# Patient Record
Sex: Female | Born: 1945 | Race: White | Hispanic: No | Marital: Single | Smoking: Former smoker
Health system: Southern US, Community
[De-identification: ages and names within clinical notes are randomized; demographics above are authoritative.]

## PROBLEM LIST (undated history)

## (undated) DIAGNOSIS — D61818 Other pancytopenia: Principal | ICD-10-CM

## (undated) DIAGNOSIS — K760 Fatty (change of) liver, not elsewhere classified: Secondary | ICD-10-CM

## (undated) DIAGNOSIS — K219 Gastro-esophageal reflux disease without esophagitis: Secondary | ICD-10-CM

## (undated) DIAGNOSIS — F32A Depression, unspecified: Secondary | ICD-10-CM

## (undated) DIAGNOSIS — M810 Age-related osteoporosis without current pathological fracture: Secondary | ICD-10-CM

## (undated) DIAGNOSIS — E042 Nontoxic multinodular goiter: Secondary | ICD-10-CM

## (undated) DIAGNOSIS — F419 Anxiety disorder, unspecified: Secondary | ICD-10-CM

## (undated) DIAGNOSIS — F329 Major depressive disorder, single episode, unspecified: Secondary | ICD-10-CM

## (undated) DIAGNOSIS — J45909 Unspecified asthma, uncomplicated: Secondary | ICD-10-CM

## (undated) DIAGNOSIS — M170 Bilateral primary osteoarthritis of knee: Secondary | ICD-10-CM

## (undated) DIAGNOSIS — I10 Essential (primary) hypertension: Secondary | ICD-10-CM

## (undated) HISTORY — DX: Age-related osteoporosis without current pathological fracture: M81.0

## (undated) HISTORY — DX: Other pancytopenia: D61.818

## (undated) HISTORY — DX: Bilateral primary osteoarthritis of knee: M17.0

## (undated) HISTORY — PX: KNEE SURGERY: SHX244

---

## 1998-04-14 ENCOUNTER — Inpatient Hospital Stay (HOSPITAL_COMMUNITY): Admission: EM | Admit: 1998-04-14 | Discharge: 1998-04-17 | Payer: Self-pay

## 2004-09-29 ENCOUNTER — Ambulatory Visit: Payer: Self-pay | Admitting: Internal Medicine

## 2004-10-19 ENCOUNTER — Ambulatory Visit: Payer: Self-pay | Admitting: Internal Medicine

## 2005-03-24 ENCOUNTER — Ambulatory Visit: Payer: Self-pay | Admitting: Internal Medicine

## 2005-07-31 ENCOUNTER — Ambulatory Visit: Payer: Self-pay | Admitting: Internal Medicine

## 2008-04-12 ENCOUNTER — Encounter: Admission: RE | Admit: 2008-04-12 | Discharge: 2008-04-12 | Payer: Self-pay | Admitting: Orthopaedic Surgery

## 2008-05-17 ENCOUNTER — Encounter: Admission: RE | Admit: 2008-05-17 | Discharge: 2008-07-04 | Payer: Self-pay | Admitting: Orthopaedic Surgery

## 2013-06-13 ENCOUNTER — Inpatient Hospital Stay (HOSPITAL_COMMUNITY): Admission: RE | Admit: 2013-06-13 | Payer: Self-pay | Source: Ambulatory Visit

## 2013-11-02 ENCOUNTER — Other Ambulatory Visit (HOSPITAL_COMMUNITY)
Admission: RE | Admit: 2013-11-02 | Discharge: 2013-11-02 | Disposition: A | Discharge status: Home / Self Care | Payer: Medicare Other | Source: Ambulatory Visit | Attending: Family Medicine | Admitting: Family Medicine

## 2013-11-02 ENCOUNTER — Other Ambulatory Visit: Payer: Self-pay | Admitting: Family Medicine

## 2013-11-02 DIAGNOSIS — Z1151 Encounter for screening for human papillomavirus (HPV): Secondary | ICD-10-CM | POA: Insufficient documentation

## 2013-11-02 DIAGNOSIS — Z124 Encounter for screening for malignant neoplasm of cervix: Secondary | ICD-10-CM | POA: Insufficient documentation

## 2014-02-27 ENCOUNTER — Other Ambulatory Visit: Payer: Self-pay

## 2014-02-27 DIAGNOSIS — Z1231 Encounter for screening mammogram for malignant neoplasm of breast: Secondary | ICD-10-CM

## 2014-03-13 ENCOUNTER — Ambulatory Visit
Admission: RE | Admit: 2014-03-13 | Discharge: 2014-03-13 | Disposition: A | Discharge status: Home / Self Care | Payer: Medicare Other | Source: Ambulatory Visit

## 2014-03-13 DIAGNOSIS — Z1231 Encounter for screening mammogram for malignant neoplasm of breast: Secondary | ICD-10-CM

## 2014-11-04 ENCOUNTER — Other Ambulatory Visit: Payer: Self-pay | Admitting: Family Medicine

## 2014-11-04 DIAGNOSIS — E042 Nontoxic multinodular goiter: Secondary | ICD-10-CM

## 2014-11-08 ENCOUNTER — Ambulatory Visit
Admission: RE | Admit: 2014-11-08 | Discharge: 2014-11-08 | Disposition: A | Discharge status: Home / Self Care | Payer: Medicare Other | Source: Ambulatory Visit | Attending: Family Medicine | Admitting: Family Medicine

## 2014-11-08 DIAGNOSIS — E042 Nontoxic multinodular goiter: Secondary | ICD-10-CM

## 2015-03-25 ENCOUNTER — Other Ambulatory Visit: Payer: Self-pay

## 2015-03-25 DIAGNOSIS — Z1231 Encounter for screening mammogram for malignant neoplasm of breast: Secondary | ICD-10-CM

## 2015-05-01 ENCOUNTER — Ambulatory Visit
Admission: RE | Admit: 2015-05-01 | Discharge: 2015-05-01 | Disposition: A | Discharge status: Home / Self Care | Payer: Medicare Other | Source: Ambulatory Visit

## 2015-05-01 DIAGNOSIS — Z1231 Encounter for screening mammogram for malignant neoplasm of breast: Secondary | ICD-10-CM

## 2016-07-15 ENCOUNTER — Other Ambulatory Visit: Payer: Self-pay | Admitting: Family Medicine

## 2016-07-15 DIAGNOSIS — Z1231 Encounter for screening mammogram for malignant neoplasm of breast: Secondary | ICD-10-CM

## 2016-07-16 ENCOUNTER — Ambulatory Visit
Admission: RE | Admit: 2016-07-16 | Discharge: 2016-07-16 | Disposition: A | Discharge status: Home / Self Care | Payer: Medicare Other | Source: Ambulatory Visit | Attending: Family Medicine | Admitting: Family Medicine

## 2016-07-16 DIAGNOSIS — Z1231 Encounter for screening mammogram for malignant neoplasm of breast: Secondary | ICD-10-CM

## 2017-03-11 ENCOUNTER — Encounter (HOSPITAL_COMMUNITY): Payer: Self-pay | Admitting: Emergency Medicine

## 2017-03-11 ENCOUNTER — Emergency Department (HOSPITAL_COMMUNITY)
Admission: EM | Admit: 2017-03-11 | Discharge: 2017-03-11 | Disposition: A | Discharge status: Home / Self Care | Payer: Medicare Other | Attending: Emergency Medicine | Admitting: Emergency Medicine

## 2017-03-11 ENCOUNTER — Emergency Department (HOSPITAL_COMMUNITY): Payer: Medicare Other

## 2017-03-11 DIAGNOSIS — E860 Dehydration: Secondary | ICD-10-CM | POA: Diagnosis not present

## 2017-03-11 DIAGNOSIS — R51 Headache: Secondary | ICD-10-CM | POA: Diagnosis not present

## 2017-03-11 DIAGNOSIS — Z87891 Personal history of nicotine dependence: Secondary | ICD-10-CM | POA: Diagnosis not present

## 2017-03-11 DIAGNOSIS — M791 Myalgia: Secondary | ICD-10-CM | POA: Insufficient documentation

## 2017-03-11 DIAGNOSIS — I959 Hypotension, unspecified: Secondary | ICD-10-CM | POA: Insufficient documentation

## 2017-03-11 DIAGNOSIS — R42 Dizziness and giddiness: Secondary | ICD-10-CM | POA: Insufficient documentation

## 2017-03-11 DIAGNOSIS — R0789 Other chest pain: Secondary | ICD-10-CM | POA: Insufficient documentation

## 2017-03-11 LAB — URINALYSIS, ROUTINE W REFLEX MICROSCOPIC
Bilirubin Urine: NEGATIVE
GLUCOSE, UA: NEGATIVE mg/dL
Ketones, ur: NEGATIVE mg/dL
LEUKOCYTES UA: NEGATIVE
NITRITE: NEGATIVE
Protein, ur: NEGATIVE mg/dL
SPECIFIC GRAVITY, URINE: 1.012 (ref 1.005–1.030)
pH: 5 (ref 5.0–8.0)

## 2017-03-11 LAB — COMPREHENSIVE METABOLIC PANEL
ALK PHOS: 53 U/L (ref 38–126)
ALT: 35 U/L (ref 14–54)
AST: 57 U/L — AB (ref 15–41)
Albumin: 4.2 g/dL (ref 3.5–5.0)
Anion gap: 10 (ref 5–15)
BILIRUBIN TOTAL: 1 mg/dL (ref 0.3–1.2)
BUN: 26 mg/dL — AB (ref 6–20)
CALCIUM: 8.9 mg/dL (ref 8.9–10.3)
CO2: 25 mmol/L (ref 22–32)
CREATININE: 1.28 mg/dL — AB (ref 0.44–1.00)
Chloride: 99 mmol/L — ABNORMAL LOW (ref 101–111)
GFR calc Af Amer: 49 mL/min — ABNORMAL LOW (ref >60)
GFR, EST NON AFRICAN AMERICAN: 42 mL/min — AB (ref >60)
Glucose, Bld: 125 mg/dL — ABNORMAL HIGH (ref 65–99)
POTASSIUM: 3.4 mmol/L — AB (ref 3.5–5.1)
Sodium: 134 mmol/L — ABNORMAL LOW (ref 135–145)
TOTAL PROTEIN: 6.8 g/dL (ref 6.5–8.1)

## 2017-03-11 LAB — CBC WITH DIFFERENTIAL/PLATELET
BASOS ABS: 0 10*3/uL (ref 0.0–0.1)
Basophils Relative: 1 %
EOS ABS: 0 10*3/uL (ref 0.0–0.7)
Eosinophils Relative: 0 %
HCT: 38.7 % (ref 36.0–46.0)
Hemoglobin: 13.8 g/dL (ref 12.0–15.0)
LYMPHS ABS: 0.4 10*3/uL — AB (ref 0.7–4.0)
Lymphocytes Relative: 16 %
MCH: 30.9 pg (ref 26.0–34.0)
MCHC: 35.7 g/dL (ref 30.0–36.0)
MCV: 86.6 fL (ref 78.0–100.0)
MONO ABS: 0.2 10*3/uL (ref 0.1–1.0)
MONOS PCT: 7 %
NEUTROS ABS: 2 10*3/uL (ref 1.7–7.7)
Neutrophils Relative %: 76 %
PLATELETS: 75 10*3/uL — AB (ref 150–400)
RBC: 4.47 MIL/uL (ref 3.87–5.11)
RDW: 12.9 % (ref 11.5–15.5)
WBC: 2.6 10*3/uL — AB (ref 4.0–10.5)

## 2017-03-11 LAB — I-STAT CG4 LACTIC ACID, ED: Lactic Acid, Venous: 1.78 mmol/L (ref 0.5–1.9)

## 2017-03-11 LAB — I-STAT CHEM 8, ED
BUN: 24 mg/dL — AB (ref 6–20)
CALCIUM ION: 1.16 mmol/L (ref 1.15–1.40)
CREATININE: 0.9 mg/dL (ref 0.44–1.00)
Chloride: 97 mmol/L — ABNORMAL LOW (ref 101–111)
GLUCOSE: 99 mg/dL (ref 65–99)
HCT: 33 % — ABNORMAL LOW (ref 36.0–46.0)
HEMOGLOBIN: 11.2 g/dL — AB (ref 12.0–15.0)
Potassium: 3.6 mmol/L (ref 3.5–5.1)
Sodium: 134 mmol/L — ABNORMAL LOW (ref 135–145)
TCO2: 26 mmol/L (ref 0–100)

## 2017-03-11 LAB — I-STAT TROPONIN, ED
TROPONIN I, POC: 0.01 ng/mL (ref 0.00–0.08)
Troponin i, poc: 0 ng/mL (ref 0.00–0.08)

## 2017-03-11 LAB — MAGNESIUM: MAGNESIUM: 1.9 mg/dL (ref 1.7–2.4)

## 2017-03-11 LAB — I-STAT CREATININE, ED: Creatinine, Ser: 1 mg/dL (ref 0.44–1.00)

## 2017-03-11 MED ORDER — POTASSIUM CHLORIDE CRYS ER 20 MEQ PO TBCR
40.0000 meq | EXTENDED_RELEASE_TABLET | Freq: Once | ORAL | Status: AC
  Administered 2017-03-11: 40 meq via ORAL
  Filled 2017-03-11: qty 2

## 2017-03-11 MED ORDER — LACTATED RINGERS IV BOLUS (SEPSIS)
1000.0000 mL | Freq: Once | INTRAVENOUS | Status: AC
  Administered 2017-03-11: 1000 mL via INTRAVENOUS

## 2017-03-11 MED ORDER — ACETAMINOPHEN 325 MG PO TABS
650.0000 mg | ORAL_TABLET | Freq: Once | ORAL | Status: AC
  Administered 2017-03-11: 650 mg via ORAL
  Filled 2017-03-11: qty 2

## 2017-03-11 NOTE — ED Notes (Signed)
Pt reminded of need for urine. 1st bolus still going. Will start 2nd when finished.

## 2017-03-11 NOTE — ED Triage Notes (Signed)
Pt sent from PCP for hypotension. Onset Tuesday was feeling bad heartburn, felt weak and headache on Wednesday, chills onset Thursday, dysuria. Hasn't been drinking much. Pt takes Zoloft, states she has been forgetting to take this week.

## 2017-03-11 NOTE — ED Notes (Signed)
Pt ambulated with steady gait without assistance

## 2017-03-11 NOTE — Discharge Instructions (Signed)
Please make sure you're staying hydrated. Is important that she have your blood test redrawn on Monday by her primary care doctor with close follow-up on Monday. Return to the ED he develop any worsening symptoms over the weekend.

## 2017-03-11 NOTE — ED Provider Notes (Signed)
[place] DEPT Provider Note   CSN: [id number] Arrival date & time: 03/11/17  1251     History   Chief Complaint Chief Complaint  Patient presents with  . Hypotension    HPI Sandra Day is a 71 y.o. female.  HPI 71 year old Caucasian female past medical history significant for depression, osteoporosis, degenerative disc disease, thyroid nodules, asthma that presents to the emergency department today with referral from urgent care for hypotension. The patient presents with complaints of generalized weakness, intermittent headaches, chills, dysuria. Patient states that her symptoms have been ongoing over the past 3 days. States her symptoms started 3 days ago with really bad heartburn and burning in her chest while lying down. States that she woke up on Wednesday and was feeling very weak having intermittent right-sided gradually worsening headache. States that yesterday she developed severe chills and diaphoresis. He felt very feverish but did not take her temperature. This morning she reports some mild dysuria. She felt very weak, fatigued, dizzy while standing up and thought she may pass out. He went to urgent care for evaluation. They sent here for IV fluids due to hypotension for possible dehydration. Patient REPORTS not taking her Zoloft for the past 3 days. Urgent care felt like she may be withdrawing from her Zoloft causing her symptoms. Patient states that she has had a poor appetite over the past few days. She has had poor by mouth intake for the past 3 days. She reports mostly drinking ginger ale and Coke. She is not training for his symptoms at home. Nothing makes better or worse.  Pt denies any chill, vision changes, lightheadedness, congestion, neck pain, cp, sob, cough, abd pain, n/v/d, change in bowel habits, melena, hematochezia.  History reviewed. No pertinent past medical history.  There are no active problems to display for this patient.   Past Surgical  History:  Procedure Laterality Date  . KNEE SURGERY      OB History    No data available       Home Medications    Prior to Admission medications   Not on File    Family History No family history on file.  Social History Social History  Substance Use Topics  . Smoking status: Former Research scientist (life sciences)  . Smokeless tobacco: Not on file  . Alcohol use No     Allergies   Penicillins and Sulfa antibiotics   Review of Systems Review of Systems  Constitutional: Positive for appetite change, chills and fatigue. Negative for fever.  HENT: Negative for congestion.   Eyes: Negative for visual disturbance.  Respiratory: Negative for cough, shortness of breath and wheezing.   Cardiovascular: Positive for chest pain. Negative for palpitations and leg swelling.  Gastrointestinal: Negative for abdominal pain, diarrhea, nausea and vomiting.  Genitourinary: Positive for dysuria. Negative for flank pain, frequency, hematuria, urgency, vaginal bleeding and vaginal discharge.  Musculoskeletal: Positive for myalgias. Negative for arthralgias.  Skin: Negative for rash.  Neurological: Positive for dizziness and headaches. Negative for syncope, weakness, light-headedness and numbness.  Psychiatric/Behavioral: Negative for sleep disturbance. The patient is not nervous/anxious.      Physical Exam Updated Vital Signs BP (!) 85/59 (BP Location: Left Arm)   Pulse 91   Temp 97.6 F (36.4 C) (Oral)   Resp 16   SpO2 100%   Physical Exam  Constitutional: She is oriented to person, place, and time. She appears well-developed and well-nourished.  Non-toxic appearance. No distress.  HENT:  Head: Normocephalic and atraumatic.  Right Ear:  Tympanic membrane, external ear and ear canal normal.  Left Ear: Tympanic membrane, external ear and ear canal normal.  Nose: Nose normal. No mucosal edema or rhinorrhea.  Mouth/Throat: Uvula is midline, oropharynx is clear and moist and mucous membranes are normal.    Eyes: Pupils are equal, round, and reactive to light. Conjunctivae and EOM are normal. Right eye exhibits no discharge. Left eye exhibits no discharge.  Neck: Normal range of motion. Neck supple.  Cardiovascular: Normal rate, regular rhythm, normal heart sounds and intact distal pulses.  Exam reveals no gallop and no friction rub.   No murmur heard. Pulmonary/Chest: Effort normal and breath sounds normal. No respiratory distress. She has no wheezes. She has no rales. She exhibits no tenderness.  Abdominal: Soft. Bowel sounds are normal. There is no tenderness. There is no rebound and no guarding.  Musculoskeletal: Normal range of motion. She exhibits no tenderness.  Lymphadenopathy:    She has no cervical adenopathy.  Neurological: She is alert and oriented to person, place, and time.  The patient is alert, attentive, and oriented x 3. Speech is clear. Cranial nerve II-VII grossly intact. Negative pronator drift. Sensation intact. Strength 5/5 in all extremities. Reflexes 2+ and symmetric at biceps, triceps, knees, and ankles. Rapid alternating movement and fine finger movements intact.    Skin: Skin is warm and dry. Capillary refill takes less than 2 seconds.  Poor skin turgor  Psychiatric: Her behavior is normal. Judgment and thought content normal.  Nursing note and vitals reviewed.    ED Treatments / Results  Labs (all labs ordered are listed, but only abnormal results are displayed) Labs Reviewed  COMPREHENSIVE METABOLIC PANEL - Abnormal; Notable for the following:       Result Value   Sodium 134 (*)    Potassium 3.4 (*)    Chloride 99 (*)    Glucose, Bld 125 (*)    BUN 26 (*)    Creatinine, Ser 1.28 (*)    AST 57 (*)    GFR calc non Af Amer 42 (*)    GFR calc Af Amer 49 (*)    All other components within normal limits  CBC WITH DIFFERENTIAL/PLATELET - Abnormal; Notable for the following:    WBC 2.6 (*)    Platelets 75 (*)    Lymphs Abs 0.4 (*)    All other components  within normal limits  URINALYSIS, ROUTINE W REFLEX MICROSCOPIC - Abnormal; Notable for the following:    APPearance HAZY (*)    Hgb urine dipstick SMALL (*)    Bacteria, UA RARE (*)    Squamous Epithelial / LPF 0-5 (*)    All other components within normal limits  I-STAT CHEM 8, ED - Abnormal; Notable for the following:    Sodium 134 (*)    Chloride 97 (*)    BUN 24 (*)    Hemoglobin 11.2 (*)    HCT 33.0 (*)    All other components within normal limits  MAGNESIUM  I-STAT CG4 LACTIC ACID, ED  I-STAT TROPOININ, ED  I-STAT TROPOININ, ED  I-STAT CREATININE, ED    EKG  EKG Interpretation  Date/Time:  Friday March 11 2017 13:59:55 EDT Ventricular Rate:  89 PR Interval:    QRS Duration: 94 QT Interval:  360 QTC Calculation: 438 R Axis:   67 Text Interpretation:  Sinus rhythm Low voltage, precordial leads No significant change since last tracing Confirmed by Duffy Bruce [phone]) on 03/11/2017 2:01:47 PM  Radiology Dg Chest 2 View  Result Date: 03/11/2017 CLINICAL DATA:  Hypotension today, began feeling bad Tuesday with weakness and heartburn, headache on Wednesday, chills on Thursday, decreased oral intake, former smoker EXAM: CHEST  2 VIEW COMPARISON:  None FINDINGS: Normal heart size, mediastinal contours, and pulmonary vascularity. Lungs clear. No pleural effusion or pneumothorax. EKG lead projects over LEFT upper lobe. No acute osseous findings.  Next filled no acute abnormalities. IMPRESSION: No active cardiopulmonary disease. Electronically Signed   By: Lavonia Dana M.D.   On: 03/11/2017 14:58    Procedures Procedures (including critical care time)  Medications Ordered in ED Medications  lactated ringers bolus 1,000 mL (0 mLs Intravenous Stopped 03/11/17 2109)  lactated ringers bolus 1,000 mL (0 mLs Intravenous Stopped 03/11/17 1737)  potassium chloride SA (K-DUR,KLOR-CON) CR tablet 40 mEq (40 mEq Oral Given 03/11/17 1759)  acetaminophen (TYLENOL) tablet 650 mg (650  mg Oral Given 03/11/17 1858)     Initial Impression / Assessment and Plan / ED Course  I have reviewed the triage vital signs and the nursing notes.  Pertinent labs & imaging results that were available during my care of the patient were reviewed by me and considered in my medical decision making (see chart for details).     Patient resents to the emergency department today after referral from PCP for hypotension. On exam patient is severely dehydrated appearing. She is hypotensive 85/61. The urgent care with concern for Zoloft withdrawal versus dehydration. Vital signs revealed no tachycardia. She is afebrile. Normal respiratory rate. She is not hypoxic. Blood work seems consistent with dehydration. She does have a leukopenia and thrombocytopenia with unknown cause. Patient lactic acid was normal. Repeat lactic was normal. Troponin was normal. EKG shows no scans of ischemia. Chest x-ray unremarkable. Patient was given several liters of IV fluids. UA shows no signs of infection. Her blood pressure improved. Patient was able to ambulate and felt much improved without any gait disturbances. She has no focal neuro deficits. Patient feels much improved and wants to be discharged. Repeat labs are unremarkable. Patient with mild hypokalemia which was replaced orally. Feel the patient's symptoms are likely due to dehydration. No infectious cause identified  On repeat exam vital signs are much improved. Will be discharged home with close follow-up with her PCP on Monday. Encouraged to return to the ED she tells any worsening symptoms in the interim. Patient was seen and evaluated by my attending Dr. Ellender Hose who is agreeable to the above plan.   Pt is hemodynamically stable, in NAD, & able to ambulate in the ED. Evaluation does not show pathology that would require ongoing emergent intervention or inpatient treatment. I explained the diagnosis to the patient. Pain has been managed & has no complaints prior to  dc. Pt is comfortable with above plan and is stable for discharge at this time. All questions were answered prior to disposition. Strict return precautions for f/u to the ED were discussed. Encouraged follow up with PCP.   Final Clinical Impressions(s) / ED Diagnoses   Final diagnoses:  Dehydration  Hypotension, unspecified hypotension type    New Prescriptions Discharge Medication List as of 03/11/2017  9:09 PM       Doristine Devoid, PA-C 03/11/17 2202    Duffy Bruce, MD 03/13/17 1136

## 2017-03-14 ENCOUNTER — Emergency Department (HOSPITAL_COMMUNITY)
Admission: EM | Admit: 2017-03-14 | Discharge: 2017-03-14 | Disposition: A | Discharge status: Home / Self Care | Payer: Medicare Other | Attending: Emergency Medicine | Admitting: Emergency Medicine

## 2017-03-14 ENCOUNTER — Encounter (HOSPITAL_COMMUNITY): Payer: Self-pay | Admitting: Emergency Medicine

## 2017-03-14 DIAGNOSIS — Z87891 Personal history of nicotine dependence: Secondary | ICD-10-CM | POA: Diagnosis not present

## 2017-03-14 DIAGNOSIS — R51 Headache: Secondary | ICD-10-CM | POA: Diagnosis not present

## 2017-03-14 DIAGNOSIS — D696 Thrombocytopenia, unspecified: Secondary | ICD-10-CM | POA: Insufficient documentation

## 2017-03-14 DIAGNOSIS — Z79899 Other long term (current) drug therapy: Secondary | ICD-10-CM | POA: Insufficient documentation

## 2017-03-14 HISTORY — DX: Depression, unspecified: F32.A

## 2017-03-14 HISTORY — DX: Major depressive disorder, single episode, unspecified: F32.9

## 2017-03-14 LAB — COMPREHENSIVE METABOLIC PANEL
ALBUMIN: 3.5 g/dL (ref 3.5–5.0)
ALT: 60 U/L — AB (ref 14–54)
AST: 98 U/L — AB (ref 15–41)
Alkaline Phosphatase: 96 U/L (ref 38–126)
Anion gap: 9 (ref 5–15)
BUN: 20 mg/dL (ref 6–20)
CHLORIDE: 99 mmol/L — AB (ref 101–111)
CO2: 25 mmol/L (ref 22–32)
CREATININE: 0.91 mg/dL (ref 0.44–1.00)
Calcium: 7.8 mg/dL — ABNORMAL LOW (ref 8.9–10.3)
GFR calc Af Amer: >60 mL/min (ref >60)
GFR calc non Af Amer: >60 mL/min (ref >60)
GLUCOSE: 106 mg/dL — AB (ref 65–99)
POTASSIUM: 3.9 mmol/L (ref 3.5–5.1)
Sodium: 133 mmol/L — ABNORMAL LOW (ref 135–145)
Total Bilirubin: 0.8 mg/dL (ref 0.3–1.2)
Total Protein: 5.8 g/dL — ABNORMAL LOW (ref 6.5–8.1)

## 2017-03-14 LAB — CBC WITH DIFFERENTIAL/PLATELET
BASOS ABS: 0.1 10*3/uL (ref 0.0–0.1)
BASOS PCT: 2 %
EOS PCT: 1 %
Eosinophils Absolute: 0 10*3/uL (ref 0.0–0.7)
HEMATOCRIT: 32.9 % — AB (ref 36.0–46.0)
Hemoglobin: 11.8 g/dL — ABNORMAL LOW (ref 12.0–15.0)
LYMPHS PCT: 40 %
Lymphs Abs: 1.5 10*3/uL (ref 0.7–4.0)
MCH: 29.7 pg (ref 26.0–34.0)
MCHC: 35.9 g/dL (ref 30.0–36.0)
MCV: 82.9 fL (ref 78.0–100.0)
MONO ABS: 0.4 10*3/uL (ref 0.1–1.0)
Monocytes Relative: 10 %
NEUTROS ABS: 1.8 10*3/uL (ref 1.7–7.7)
Neutrophils Relative %: 48 %
PLATELETS: 69 10*3/uL — AB (ref 150–400)
RBC: 3.97 MIL/uL (ref 3.87–5.11)
RDW: 12.8 % (ref 11.5–15.5)
WBC: 3.8 10*3/uL — AB (ref 4.0–10.5)

## 2017-03-14 NOTE — ED Triage Notes (Signed)
Pt sent from PCP for low platelet count. Pt states she was seen on Friday for hypotension and then was followed up today and platelet count was 64 on Friday count was 75. Pt currently alert and oriented x 4. Pt reports headache at this time.

## 2017-03-14 NOTE — ED Notes (Signed)
Per PCP-saw patient today for ED follow-up-states patient has a complaint of weakness-states patient was evaluated in ED last weak for same-PA states she is not sure why her low platelet count was not addressed

## 2017-03-14 NOTE — ED Provider Notes (Signed)
[facility] DEPT Provider Note   CSN: [id number] Arrival date & time: 03/14/17  1916     History   Chief Complaint Chief Complaint  Patient presents with  . abnormal labs    HPI Sandra Day is a 71 y.o. female.  HPI  71 year old female presents to the ED for evaluation of thrombocytopenia. She was here last week for fatigue and hypotension. She was discharged home. At that time she had a platelet count of 75 which is an apparently new finding for her. She states she's had a mild headache for about one week. The day before coming into the ER it seemed to be worse but is now back to more of a mild headache. It is frontal. No neck pain or stiffness. No fevers during this time. She has not had any infectious signs or symptoms such as cough, shortness of breath, congestion, or urinary symptoms. No vomiting. She denies focal weakness. She has had bilateral hand burning/tingling for about 3 weeks. She saw her orthopedist, Dr. Burney Gauze, today who told her it is probably arthritis with some nerve issues. She saw her PCP today who rechecked her CBC and her platelet count was 65 so she was told to come straight to the ER for evaluation. She denies any type of bleeding including no epistaxis or gingival bleeding. No easy bruising.  Past Medical History:  Diagnosis Date  . Depression     There are no active problems to display for this patient.   Past Surgical History:  Procedure Laterality Date  . KNEE SURGERY Left     OB History    No data available       Home Medications    Prior to Admission medications   Medication Sig Start Date End Date Taking? Authorizing Provider  alendronate (FOSAMAX) 70 MG tablet Take 70 mg by mouth every Sunday. 01/25/17  Yes [provider]  Ascorbic Acid (VITAMIN C) 1000 MG tablet Take 2,000 mg by mouth daily.   Yes [provider]  cetirizine (ZYRTEC) 10 MG tablet Take 10 mg by mouth daily.   Yes [provider]    Cholecalciferol (VITAMIN D3) 5000 units TABS Take 5,000 Units by mouth daily.   Yes [provider]  Multiple Vitamins-Minerals (HAIR/SKIN/NAILS) TABS Take 3 tablets by mouth daily.   Yes [provider]  naproxen sodium (ANAPROX) 220 MG tablet Take 440 mg by mouth every 12 (twelve) hours as needed (for pain).   Yes [provider]  Nutritional Supplements (JUICE PLUS FIBRE PO) Take 4 capsules by mouth daily. Takes 2 veggies and 2 fruits a day   Yes [provider]  sertraline (ZOLOFT) 50 MG tablet Take 50 mg by mouth daily. 01/31/17  Yes [provider]  vitamin B-12 (CYANOCOBALAMIN) 1000 MCG tablet Take 1,000 mcg by mouth daily.   Yes [provider]    Family History History reviewed. No pertinent family history.  Social History Social History  Substance Use Topics  . Smoking status: Former Research scientist (life sciences)  . Smokeless tobacco: Never Used  . Alcohol use No     Allergies   Sulfa antibiotics and Penicillins   Review of Systems Review of Systems  Constitutional: Positive for fatigue (last week, now resolved). Negative for fever.  Respiratory: Negative for cough and shortness of breath.   Cardiovascular: Negative for chest pain.  Gastrointestinal: Negative for abdominal pain, nausea and vomiting.  Genitourinary: Negative for dysuria.  Neurological: Positive for headaches. Negative for weakness.  All  other systems reviewed and are negative.    Physical Exam Updated Vital Signs BP 114/63 (BP Location: Right Arm)   Pulse 79   Temp 98.4 F (36.9 C) (Oral)   Resp 20   Ht 5\' 6"  (1.676 m)   Wt 60.3 kg (133 lb)   SpO2 95%   BMI 21.47 kg/m   Physical Exam  Constitutional: She is oriented to person, place, and time. She appears well-developed and well-nourished. No distress.  HENT:  Head: Normocephalic and atraumatic.  Right Ear: External ear normal.  Left Ear: External ear normal.  Nose: Nose normal.  No oral bleeding  Eyes:  Pupils are equal, round, and reactive to light. EOM are normal. Right eye exhibits no discharge. Left eye exhibits no discharge.  Neck: Neck supple.  Cardiovascular: Normal rate, regular rhythm and normal heart sounds.   Pulmonary/Chest: Effort normal and breath sounds normal.  Abdominal: Soft. There is no tenderness.  Neurological: She is alert and oriented to person, place, and time.  CN 3-12 grossly intact. 5/5 strength in all 4 extremities. Grossly normal sensation. Normal finger to nose.   Skin: Skin is warm and dry. She is not diaphoretic.  Nursing note and vitals reviewed.    ED Treatments / Results  Labs (all labs ordered are listed, but only abnormal results are displayed) Labs Reviewed  CBC WITH DIFFERENTIAL/PLATELET - Abnormal; Notable for the following:       Result Value   WBC 3.8 (*)    Hemoglobin 11.8 (*)    HCT 32.9 (*)    Platelets 69 (*)    All other components within normal limits  COMPREHENSIVE METABOLIC PANEL - Abnormal; Notable for the following:    Sodium 133 (*)    Chloride 99 (*)    Glucose, Bld 106 (*)    Calcium 7.8 (*)    Total Protein 5.8 (*)    AST 98 (*)    ALT 60 (*)    All other components within normal limits  TECHNOLOGIST SMEAR REVIEW  SAVE SMEAR    EKG  EKG Interpretation None       Radiology No results found.  Procedures Procedures (including critical care time)  Medications Ordered in ED Medications - No data to display   Initial Impression / Assessment and Plan / ED Course  I have reviewed the triage vital signs and the nursing notes.  Pertinent labs & imaging results that were available during my care of the patient were reviewed by me and considered in my medical decision making (see chart for details).     Patient is overall well-appearing. She has a benign neurologic exam. Her platelet count is stable, currently 69. Of note, her hemoglobin and white blood cell count are very mildly low for the respective regions. I  discussed her case with the hematologist on-call, Dr. Earlie Server, who has reviewed her lab work and at this point she is stable for an outpatient workup with hematology. She will be given this referral. Of note her creatinine is normal, I think TTP is highly unlikely. She is afebrile. She will be discharged home with return precautions and follow up with PCP and hematology as an outpatient.  Final Clinical Impressions(s) / ED Diagnoses   Final diagnoses:  Thrombocytopenia ([place])    New Prescriptions New Prescriptions   No medications on file     Sherwood Gambler, MD 03/14/17 2337

## 2017-03-14 NOTE — ED Notes (Signed)
Called lab for smear dup clicked off

## 2017-03-15 LAB — SAVE SMEAR

## 2017-03-16 ENCOUNTER — Telehealth: Payer: Self-pay | Admitting: Hematology

## 2017-03-16 ENCOUNTER — Encounter: Payer: Self-pay | Admitting: Hematology

## 2017-03-16 NOTE — Telephone Encounter (Signed)
Appt has been scheduled for the pt to see Dr. Irene Limbo on 8/6 at 3pm. Pt aware to arrive 30 minutes early. Demographics verified. Letter mailed to the pt.

## 2017-03-22 ENCOUNTER — Ambulatory Visit (INDEPENDENT_AMBULATORY_CARE_PROVIDER_SITE_OTHER): Payer: Medicare Other | Admitting: Oncology

## 2017-03-22 ENCOUNTER — Encounter: Payer: Self-pay | Admitting: Oncology

## 2017-03-22 DIAGNOSIS — M170 Bilateral primary osteoarthritis of knee: Secondary | ICD-10-CM | POA: Diagnosis not present

## 2017-03-22 DIAGNOSIS — R740 Nonspecific elevation of levels of transaminase and lactic acid dehydrogenase [LDH]: Secondary | ICD-10-CM | POA: Diagnosis not present

## 2017-03-22 DIAGNOSIS — M810 Age-related osteoporosis without current pathological fracture: Secondary | ICD-10-CM

## 2017-03-22 DIAGNOSIS — F329 Major depressive disorder, single episode, unspecified: Secondary | ICD-10-CM

## 2017-03-22 DIAGNOSIS — Z79899 Other long term (current) drug therapy: Secondary | ICD-10-CM

## 2017-03-22 DIAGNOSIS — D61818 Other pancytopenia: Secondary | ICD-10-CM | POA: Diagnosis not present

## 2017-03-22 DIAGNOSIS — Z880 Allergy status to penicillin: Secondary | ICD-10-CM | POA: Diagnosis not present

## 2017-03-22 DIAGNOSIS — Z882 Allergy status to sulfonamides status: Secondary | ICD-10-CM

## 2017-03-22 DIAGNOSIS — E042 Nontoxic multinodular goiter: Secondary | ICD-10-CM

## 2017-03-22 DIAGNOSIS — Z87891 Personal history of nicotine dependence: Secondary | ICD-10-CM

## 2017-03-22 DIAGNOSIS — Z791 Long term (current) use of non-steroidal anti-inflammatories (NSAID): Secondary | ICD-10-CM | POA: Diagnosis not present

## 2017-03-22 HISTORY — DX: Other pancytopenia: D61.818

## 2017-03-22 HISTORY — DX: Bilateral primary osteoarthritis of knee: M17.0

## 2017-03-22 HISTORY — DX: Age-related osteoporosis without current pathological fracture: M81.0

## 2017-03-22 LAB — SAVE SMEAR

## 2017-03-22 NOTE — Progress Notes (Signed)
New Patient Hematology   Sandra Day [id number] 05-28-1946 71 y.o. 03/22/2017  CC: Dr Kathyrn Lass   Reason for referral: Evaluate Pancytopenia   HPI:  Pleasant 71 year old woman who is been in overall good health. She has a nodular goiter. Biopsy of a left sided nodule about 3 months ago in [place] reported to her as benign. She has been on sertraline 50 mg daily for about 20 years for depression. She had a traumatic knee injury requiring surgery when she was 71 years old and possibly received blood transfusions at that time. About 2 weeks ago she developed rather sudden onset of generalized weakness, leg heaviness, intermittent headache, drenching night sweats, chills, and intermittent chest discomfort without nausea, vomiting, or diarrhea. She was first seen in the emergency department on July 13. Blood pressure was low. She was given fluids. Laboratory profile done at that time showed BUN 26, creatinine 1.3, random glucose 125, SGOT 57, SGPT 35, bilirubin 1.0, hemoglobin 13.8, hematocrit 39, MCV 86.6, white count 2600 with 76% neutrophils, 16 lymphocytes, 7 monocytes, platelets 75,000. She was seen a day or 2 later by her primary care physician/nurse practitioner. Lab work was repeated. Platelet count was lower than the recorded value on July 13. She was told to go back to the [facility]. Repeat laboratory done at that time now reflecting post hydration status showed hemoglobin 11.8, white count 3800 with 48% neutrophils, 40 lymphocytes, 10 monocytes, and platelet count 69,000. Liver functions remained abnormal with SGOT 98, SGPT 60. Renal function now normal with BUN 20 and creatinine 0.9. Outpatient referral to hematology made at that time. She was never told she had a blood problem before. There are no comparison lab values available at time of this dictation. She has degenerative arthritis but no signs or symptoms of a collagen vascular disorder. Her constitutional  symptoms have resolved but she has developed interim constipation. She denies any history of hepatitis, yellow jaundice, or malaria. She believes she had mononucleosis when she was in college. She denies any history of toxic exposures or therapeutic radiation. 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 He started her on DHEA about one month ago.  Family history notable for some kind of a blood disorder in her mother when she was in her 65s requiring frequent transfusions.  PMH: Past Medical History:  Diagnosis Date  . Arthritis of both knees 03/22/2017  . Depression   . Osteoporosis 03/22/2017  . Pancytopenia ([place]) 03/22/2017    Past Surgical History:  Procedure Laterality Date  . KNEE SURGERY Left     Allergies: Allergies  Allergen Reactions  . Sulfa Antibiotics Rash    All over body rash  . Penicillins     Childhood reaction - Unknown reaction  Has patient had a PCN reaction causing immediate rash, facial/tongue/throat swelling, SOB or lightheadedness with hypotension: Unknown Has patient had a PCN reaction causing severe rash involving mucus membranes or skin necrosis: Unknown Has patient had a PCN reaction that required hospitalization: Unknown Has patient had a PCN reaction occurring within the last 10 years: No If all of the above answers are "NO", then may proceed with Cephalosporin use.     Medications:  Current Outpatient Prescriptions:  .  alendronate (FOSAMAX) 70 MG tablet, Take 70 mg by mouth every Sunday., Disp: , Rfl: 0 .  Ascorbic Acid (VITAMIN C) 1000 MG tablet, Take 2,000 mg by mouth daily., Disp: , Rfl:  .  cetirizine (ZYRTEC) 10 MG tablet, Take 10 mg by mouth daily., Disp: , Rfl:  .  Cholecalciferol (VITAMIN D3) 5000 units TABS, Take 5,000 Units by mouth daily., Disp: , Rfl:  .  Multiple Vitamins-Minerals (HAIR/SKIN/NAILS) TABS, Take 3 tablets by mouth daily., Disp: , Rfl:  .  naproxen  sodium (ANAPROX) 220 MG tablet, Take 440 mg by mouth every 12 (twelve) hours as needed (for pain)., Disp: , Rfl:  .  Nutritional Supplements (JUICE PLUS FIBRE PO), Take 4 capsules by mouth daily. Takes 2 veggies and 2 fruits a day, Disp: , Rfl:  .  sertraline (ZOLOFT) 50 MG tablet, Take 50 mg by mouth daily., Disp: , Rfl: 1 .  vitamin B-12 (CYANOCOBALAMIN) 1000 MCG tablet, Take 1,000 mcg by mouth daily., Disp: , Rfl:   Social History:  Originally from [place]. Single, never pregnant, no children, retired, worked for [employer]; no toxic exposures; no Radiation exposure  quit smoking 30 years ago. She has never used smokeless tobacco. She reports that she drinks alcohol. She reports that she does not use drugs.  Family History: Her mother had cervical cancer and required radiation implants. When she was in her 73 she developed some form of blood disorder requiring frequent blood transfusions. Father died of complications of obstructive airway disease. She has one sister age 57 who had Hashimoto's thyroid disease and has obstructive airway disease.  Review of Systems: See HPI Remaining ROS negative.  Physical Exam: Blood pressure (!) 129/59, pulse 90, temperature 98.9 F (37.2 C), temperature source Oral, height _0  (1.676 m), weight 129 lb 4.8 oz (58.7 kg), SpO2 95 %. Wt Readings from Last 3 Encounters:  03/22/17 129 lb 4.8 oz (58.7 kg)  03/14/17 133 lb (60.3 kg)     General appearance: Thin Caucasian woman HENNT: Pharynx no erythema, exudate, mass, or ulcer. No thyromegaly or thyroid nodules Lymph nodes: No cervical, supraclavicular, or axillary lymphadenopathy Breasts:  Lungs: Clear to auscultation, resonant to percussion throughout Heart: Regular rhythm, no murmur, no gallop, no rub, no click, no edema Abdomen: Soft, nontender, normal bowel sounds, no mass, no organomegaly Extremities: No edema, no calf tenderness Musculoskeletal: no joint  deformities GU:  Vascular: Carotid pulses 2+, no bruits, distal pulses: Dorsalis pedis 1+ symmetric Neurologic: Alert, oriented, PERRLA, optic discs: unable to visualize, vessels normal, no hemorrhage or exudate, cranial nerves grossly normal, motor strength 5 over 5, reflexes 1+ symmetric, upper body coordination normal, gait normal,normal vibration sensation over fingertips by tuning fork exam Skin: No rash or ecchymosis    Lab Results: Lab Results  Component Value Date   WBC 3.8 (L) 03/14/2017   HGB 11.8 (L) 03/14/2017   HCT 32.9 (L) 03/14/2017   MCV 82.9 03/14/2017   PLT 69 (L) 03/14/2017     Chemistry      Component Value Date/Time   NA 133 (L) 03/14/2017 2131   K 3.9 03/14/2017 2131   CL 99 (L) 03/14/2017 2131   CO2 25 03/14/2017 2131   BUN 20 03/14/2017 2131   CREATININE 0.91 03/14/2017 2131      Component Value Date/Time   CALCIUM 7.8 (L) 03/14/2017 2131   ALKPHOS 96 03/14/2017 2131   AST 98 (H) 03/14/2017 2131   ALT 60 (H) 03/14/2017 2131   BILITOT 0.8 03/14/2017 2131       Review of peripheral blood film:  Normochromic normocytic red cells.  No spherocytes, schistocytes, polychromasia, or inclusions.  Neutrophils are mature.  There is a  subpopulation of benign reactive lymphocytes consistent with an acute or chronic viral infection.  Platelets normal in number and morphology with some large platelets.  Estimated number over 200,000.   Radiological Studies: No results found.    Impression: #1. Pancytopenia Precise time of onset cannot be documented at this time and absence of previous comparison lab values. She did have a recent viral illness and this may all be secondary to that. Transaminitis might also fit with a recent viral illness. No prior history of liver disease or hepatitis. Minimal to no alcohol use. No splenomegaly on exam. Differential for pancytopenia is quite broad. Even though she has been on sertraline for 20 years medication effects on the  bone marrow are always possible. Primary bone marrow disorders such as myelodysplastic syndrome or multiple myeloma also considerations.  #2. Mild transaminitis See discussion above. If laboratory studies which I am getting today are nondiagnostic, then I would get a ultrasound of her abdomen to look at her liver and spleen.    Recommendation: I'm going to screen her for acute hepatitis A, B, C, and with her permission which she gave me today, HIV. Myeloma screen with immunoglobulins and immunofixation electrophoresis.  I will see her back when results are available and decide whether or not a bone marrow biopsy is indicated.    Murriel Hopper, MD, [facility]  Hematology-Oncology/Internal Medicine  03/22/2017, 2:49 PM   Addendum: 6:30 PM July 25: Lab now available.  Both platelet count and white count have completely recovered: White count 7900, 34 neutrophils, 52 lymphocytes, 11 monocytes, platelets 296,000.  Hemoglobin stable at 11.8, LDH 354 question lab error bilirubin 0.5, reticulocytes 2.9% Quantitative immunoglobulins all normal Hepatitis A, B, C, HIV: All negative Transaminases now almost completely normal  Impression: Transient pancytopenia and transaminase elevation related to recent viral infection.  I called the patient with the results.  I will have her keep her appointment next month and repeat the LDH and reticulocyte count along with another CBC.  Murriel Hopper, MD, [place]  Hematology-Oncology/Internal Medicine

## 2017-03-22 NOTE — Patient Instructions (Signed)
To lab today Return 3-4 weeks to review results - if anything important - we will call you before visit

## 2017-03-23 LAB — HIV ANTIBODY (ROUTINE TESTING W REFLEX): HIV Screen 4th Generation wRfx: NONREACTIVE

## 2017-03-24 LAB — CBC WITH DIFFERENTIAL/PLATELET
BASOS: 2 %
Basophils Absolute: 0.1 10*3/uL (ref 0.0–0.2)
EOS (ABSOLUTE): 0 10*3/uL (ref 0.0–0.4)
EOS: 0 %
HEMATOCRIT: 35.8 % (ref 34.0–46.6)
HEMOGLOBIN: 11.8 g/dL (ref 11.1–15.9)
IMMATURE GRANS (ABS): 0.1 10*3/uL (ref 0.0–0.1)
IMMATURE GRANULOCYTES: 1 %
LYMPHS: 52 %
Lymphocytes Absolute: 4.2 10*3/uL — ABNORMAL HIGH (ref 0.7–3.1)
MCH: 29.1 pg (ref 26.6–33.0)
MCHC: 33 g/dL (ref 31.5–35.7)
MCV: 88 fL (ref 79–97)
Monocytes Absolute: 0.8 10*3/uL (ref 0.1–0.9)
Monocytes: 11 %
NEUTROS ABS: 2.7 10*3/uL (ref 1.4–7.0)
NEUTROS PCT: 34 %
Platelets: 296 10*3/uL (ref 150–379)
RBC: 4.06 x10E6/uL (ref 3.77–5.28)
RDW: 13.9 % (ref 12.3–15.4)
WBC: 7.9 10*3/uL (ref 3.4–10.8)

## 2017-03-24 LAB — COMPREHENSIVE METABOLIC PANEL
A/G RATIO: 1.6 (ref 1.2–2.2)
ALBUMIN: 4 g/dL (ref 3.6–4.8)
ALT: 36 IU/L — ABNORMAL HIGH (ref 0–32)
AST: 44 IU/L — ABNORMAL HIGH (ref 0–40)
Alkaline Phosphatase: 70 IU/L (ref 39–117)
BILIRUBIN TOTAL: 0.5 mg/dL (ref 0.0–1.2)
BUN / CREAT RATIO: 15 (ref 12–28)
BUN: 13 mg/dL (ref 8–27)
CALCIUM: 9 mg/dL (ref 8.7–10.3)
CO2: 24 mmol/L (ref 20–29)
Chloride: 96 mmol/L (ref 96–106)
Creatinine, Ser: 0.86 mg/dL (ref 0.57–1.00)
GFR, EST AFRICAN AMERICAN: 80 mL/min/{1.73_m2} (ref >59)
GFR, EST NON AFRICAN AMERICAN: 70 mL/min/{1.73_m2} (ref >59)
GLOBULIN, TOTAL: 2.5 g/dL (ref 1.5–4.5)
Glucose: 89 mg/dL (ref 65–99)
POTASSIUM: 4 mmol/L (ref 3.5–5.2)
SODIUM: 135 mmol/L (ref 134–144)

## 2017-03-24 LAB — HEPATITIS PANEL, ACUTE
HEP A IGM: NEGATIVE
HEP B C IGM: NEGATIVE
Hepatitis B Surface Ag: NEGATIVE

## 2017-03-24 LAB — IMMUNOFIXATION ELECTROPHORESIS
IGA/IMMUNOGLOBULIN A, SERUM: 273 mg/dL (ref 87–352)
IGM (IMMUNOGLOBULIN M), SRM: 104 mg/dL (ref 26–217)
IgG (Immunoglobin G), Serum: 843 mg/dL (ref 700–1600)
TOTAL PROTEIN: 6.5 g/dL (ref 6.0–8.5)

## 2017-03-24 LAB — RETICULOCYTES: Retic Ct Pct: 2.9 % — ABNORMAL HIGH (ref 0.6–2.6)

## 2017-03-24 LAB — LACTATE DEHYDROGENASE: LDH: 354 IU/L — ABNORMAL HIGH (ref 119–226)

## 2017-03-25 ENCOUNTER — Other Ambulatory Visit: Payer: Self-pay | Admitting: Oncology

## 2017-03-25 DIAGNOSIS — D61818 Other pancytopenia: Secondary | ICD-10-CM

## 2017-03-25 DIAGNOSIS — R945 Abnormal results of liver function studies: Secondary | ICD-10-CM

## 2017-03-25 DIAGNOSIS — R7989 Other specified abnormal findings of blood chemistry: Secondary | ICD-10-CM

## 2017-04-04 ENCOUNTER — Encounter: Payer: Medicare Other | Admitting: Hematology

## 2017-04-12 ENCOUNTER — Other Ambulatory Visit: Payer: Medicare Other

## 2017-04-14 ENCOUNTER — Other Ambulatory Visit (INDEPENDENT_AMBULATORY_CARE_PROVIDER_SITE_OTHER): Payer: Medicare Other

## 2017-04-14 DIAGNOSIS — R7989 Other specified abnormal findings of blood chemistry: Secondary | ICD-10-CM

## 2017-04-14 DIAGNOSIS — R945 Abnormal results of liver function studies: Secondary | ICD-10-CM

## 2017-04-14 DIAGNOSIS — D61818 Other pancytopenia: Secondary | ICD-10-CM | POA: Diagnosis not present

## 2017-04-15 ENCOUNTER — Telehealth: Payer: Self-pay | Admitting: *Deleted

## 2017-04-15 LAB — RETICULOCYTES: RETIC CT PCT: 1.4 % (ref 0.6–2.6)

## 2017-04-15 LAB — HEPATIC FUNCTION PANEL
ALT: 15 IU/L (ref 0–32)
AST: 29 IU/L (ref 0–40)
Albumin: 4.4 g/dL (ref 3.6–4.8)
Alkaline Phosphatase: 59 IU/L (ref 39–117)
Bilirubin Total: 0.3 mg/dL (ref 0.0–1.2)
Bilirubin, Direct: 0.11 mg/dL (ref 0.00–0.40)
Total Protein: 7.1 g/dL (ref 6.0–8.5)

## 2017-04-15 LAB — CBC WITH DIFFERENTIAL/PLATELET
BASOS ABS: 0 10*3/uL (ref 0.0–0.2)
Basos: 1 %
EOS (ABSOLUTE): 0.1 10*3/uL (ref 0.0–0.4)
Eos: 2 %
Hematocrit: 38.7 % (ref 34.0–46.6)
Hemoglobin: 12.9 g/dL (ref 11.1–15.9)
IMMATURE GRANS (ABS): 0 10*3/uL (ref 0.0–0.1)
Immature Granulocytes: 0 %
LYMPHS ABS: 2.1 10*3/uL (ref 0.7–3.1)
LYMPHS: 32 %
MCH: 28.5 pg (ref 26.6–33.0)
MCHC: 33.3 g/dL (ref 31.5–35.7)
MCV: 86 fL (ref 79–97)
MONOS ABS: 0.6 10*3/uL (ref 0.1–0.9)
Monocytes: 9 %
NEUTROS ABS: 3.8 10*3/uL (ref 1.4–7.0)
Neutrophils: 56 %
PLATELETS: 375 10*3/uL (ref 150–379)
RBC: 4.52 x10E6/uL (ref 3.77–5.28)
RDW: 14.2 % (ref 12.3–15.4)
WBC: 6.6 10*3/uL (ref 3.4–10.8)

## 2017-04-15 LAB — LACTATE DEHYDROGENASE: LDH: 182 IU/L (ref 119–226)

## 2017-04-15 LAB — HAPTOGLOBIN: HAPTOGLOBIN: 106 mg/dL (ref 34–200)

## 2017-04-15 NOTE — Telephone Encounter (Signed)
-----   Message from Annia Belt, MD sent at 04/15/2017  7:49 AM EDT ----- Call pt:lab all back to normal. Suspect a virus infection was responsible for previous changes. She can skip appointment. Will forward copy of lab to her primary.

## 2017-04-15 NOTE — Telephone Encounter (Signed)
Pt informed "lab all back to normal. Suspect a virus infection was responsible for previous changes. She can skip appointment. Will forward copy of lab to her primary. " per Dr Beryle Beams. Stated she's happy labs are back to normal. And will cancel 8/28 appt.

## 2017-04-15 NOTE — Telephone Encounter (Signed)
Called pt - no answer; left message to give me a call back. 

## 2017-04-26 ENCOUNTER — Encounter: Payer: Medicare Other | Admitting: Oncology

## 2017-09-12 ENCOUNTER — Other Ambulatory Visit: Payer: Self-pay | Admitting: Family Medicine

## 2017-09-12 DIAGNOSIS — Z1231 Encounter for screening mammogram for malignant neoplasm of breast: Secondary | ICD-10-CM

## 2017-09-30 ENCOUNTER — Ambulatory Visit
Admission: RE | Admit: 2017-09-30 | Discharge: 2017-09-30 | Disposition: A | Discharge status: Home / Self Care | Payer: Medicare Other | Source: Ambulatory Visit | Attending: Family Medicine | Admitting: Family Medicine

## 2017-09-30 DIAGNOSIS — Z1231 Encounter for screening mammogram for malignant neoplasm of breast: Secondary | ICD-10-CM

## 2018-06-24 ENCOUNTER — Other Ambulatory Visit: Payer: Self-pay | Admitting: Family Medicine

## 2018-06-24 DIAGNOSIS — E7800 Pure hypercholesterolemia, unspecified: Secondary | ICD-10-CM

## 2018-07-10 ENCOUNTER — Ambulatory Visit
Admission: RE | Admit: 2018-07-10 | Discharge: 2018-07-10 | Disposition: A | Discharge status: Home / Self Care | Payer: No Typology Code available for payment source | Source: Ambulatory Visit | Attending: Family Medicine | Admitting: Family Medicine

## 2018-07-10 DIAGNOSIS — E7800 Pure hypercholesterolemia, unspecified: Secondary | ICD-10-CM

## 2019-05-11 ENCOUNTER — Other Ambulatory Visit: Payer: Self-pay | Admitting: Family Medicine

## 2019-05-11 DIAGNOSIS — Z1231 Encounter for screening mammogram for malignant neoplasm of breast: Secondary | ICD-10-CM

## 2019-06-22 ENCOUNTER — Other Ambulatory Visit: Payer: Self-pay

## 2019-06-22 DIAGNOSIS — Z20822 Contact with and (suspected) exposure to covid-19: Secondary | ICD-10-CM

## 2019-06-23 LAB — NOVEL CORONAVIRUS, NAA: SARS-CoV-2, NAA: NOT DETECTED

## 2019-07-04 ENCOUNTER — Ambulatory Visit
Admission: RE | Admit: 2019-07-04 | Discharge: 2019-07-04 | Disposition: A | Discharge status: Home / Self Care | Payer: Medicare Other | Source: Ambulatory Visit | Attending: Family Medicine | Admitting: Family Medicine

## 2019-07-04 ENCOUNTER — Other Ambulatory Visit: Payer: Self-pay

## 2019-07-04 DIAGNOSIS — Z1231 Encounter for screening mammogram for malignant neoplasm of breast: Secondary | ICD-10-CM

## 2019-10-07 ENCOUNTER — Ambulatory Visit: Payer: Medicare Other

## 2019-10-25 ENCOUNTER — Ambulatory Visit: Payer: Medicare Other

## 2020-09-04 ENCOUNTER — Other Ambulatory Visit: Payer: Self-pay | Admitting: Family Medicine

## 2020-09-04 DIAGNOSIS — M810 Age-related osteoporosis without current pathological fracture: Secondary | ICD-10-CM

## 2020-09-04 DIAGNOSIS — Z1231 Encounter for screening mammogram for malignant neoplasm of breast: Secondary | ICD-10-CM

## 2020-10-24 ENCOUNTER — Ambulatory Visit: Payer: Medicare Other

## 2020-12-10 DIAGNOSIS — G588 Other specified mononeuropathies: Secondary | ICD-10-CM | POA: Diagnosis not present

## 2020-12-11 ENCOUNTER — Other Ambulatory Visit: Payer: Self-pay

## 2020-12-11 ENCOUNTER — Ambulatory Visit
Admission: RE | Admit: 2020-12-11 | Discharge: 2020-12-11 | Disposition: A | Discharge status: Home / Self Care | Payer: Medicare Other | Source: Ambulatory Visit | Attending: Family Medicine | Admitting: Family Medicine

## 2020-12-11 DIAGNOSIS — Z1231 Encounter for screening mammogram for malignant neoplasm of breast: Secondary | ICD-10-CM

## 2020-12-22 DIAGNOSIS — M7918 Myalgia, other site: Secondary | ICD-10-CM | POA: Diagnosis not present

## 2021-01-16 ENCOUNTER — Other Ambulatory Visit: Payer: Self-pay

## 2021-01-16 ENCOUNTER — Ambulatory Visit
Admission: RE | Admit: 2021-01-16 | Discharge: 2021-01-16 | Disposition: A | Discharge status: Home / Self Care | Payer: Medicare Other | Source: Ambulatory Visit | Attending: Family Medicine | Admitting: Family Medicine

## 2021-01-16 DIAGNOSIS — M810 Age-related osteoporosis without current pathological fracture: Secondary | ICD-10-CM

## 2021-01-16 DIAGNOSIS — Z780 Asymptomatic menopausal state: Secondary | ICD-10-CM | POA: Diagnosis not present

## 2021-02-11 DIAGNOSIS — N398 Other specified disorders of urinary system: Secondary | ICD-10-CM | POA: Diagnosis not present

## 2021-02-11 DIAGNOSIS — M7918 Myalgia, other site: Secondary | ICD-10-CM | POA: Diagnosis not present

## 2021-02-23 DIAGNOSIS — H209 Unspecified iridocyclitis: Secondary | ICD-10-CM | POA: Diagnosis not present

## 2021-02-23 DIAGNOSIS — H04123 Dry eye syndrome of bilateral lacrimal glands: Secondary | ICD-10-CM | POA: Diagnosis not present

## 2021-02-23 DIAGNOSIS — H53001 Unspecified amblyopia, right eye: Secondary | ICD-10-CM | POA: Diagnosis not present

## 2021-02-23 DIAGNOSIS — Z961 Presence of intraocular lens: Secondary | ICD-10-CM | POA: Diagnosis not present

## 2021-02-23 DIAGNOSIS — H2511 Age-related nuclear cataract, right eye: Secondary | ICD-10-CM | POA: Diagnosis not present

## 2021-02-23 DIAGNOSIS — H5212 Myopia, left eye: Secondary | ICD-10-CM | POA: Diagnosis not present

## 2021-02-25 DIAGNOSIS — N398 Other specified disorders of urinary system: Secondary | ICD-10-CM | POA: Diagnosis not present

## 2021-02-25 DIAGNOSIS — M7918 Myalgia, other site: Secondary | ICD-10-CM | POA: Diagnosis not present

## 2021-03-04 DIAGNOSIS — M7918 Myalgia, other site: Secondary | ICD-10-CM | POA: Diagnosis not present

## 2021-03-04 DIAGNOSIS — N398 Other specified disorders of urinary system: Secondary | ICD-10-CM | POA: Diagnosis not present

## 2021-03-10 DIAGNOSIS — N398 Other specified disorders of urinary system: Secondary | ICD-10-CM | POA: Diagnosis not present

## 2021-03-10 DIAGNOSIS — M7918 Myalgia, other site: Secondary | ICD-10-CM | POA: Diagnosis not present

## 2021-03-17 DIAGNOSIS — N398 Other specified disorders of urinary system: Secondary | ICD-10-CM | POA: Diagnosis not present

## 2021-03-17 DIAGNOSIS — M7918 Myalgia, other site: Secondary | ICD-10-CM | POA: Diagnosis not present

## 2021-03-23 DIAGNOSIS — H3589 Other specified retinal disorders: Secondary | ICD-10-CM | POA: Diagnosis not present

## 2021-03-23 DIAGNOSIS — H2511 Age-related nuclear cataract, right eye: Secondary | ICD-10-CM | POA: Diagnosis not present

## 2021-03-23 DIAGNOSIS — H26492 Other secondary cataract, left eye: Secondary | ICD-10-CM | POA: Diagnosis not present

## 2021-03-23 DIAGNOSIS — Z961 Presence of intraocular lens: Secondary | ICD-10-CM | POA: Diagnosis not present

## 2021-03-23 DIAGNOSIS — H209 Unspecified iridocyclitis: Secondary | ICD-10-CM | POA: Diagnosis not present

## 2021-03-23 DIAGNOSIS — H04123 Dry eye syndrome of bilateral lacrimal glands: Secondary | ICD-10-CM | POA: Diagnosis not present

## 2021-03-23 DIAGNOSIS — H53001 Unspecified amblyopia, right eye: Secondary | ICD-10-CM | POA: Diagnosis not present

## 2021-06-03 DIAGNOSIS — L814 Other melanin hyperpigmentation: Secondary | ICD-10-CM | POA: Diagnosis not present

## 2021-06-03 DIAGNOSIS — L821 Other seborrheic keratosis: Secondary | ICD-10-CM | POA: Diagnosis not present

## 2021-06-03 DIAGNOSIS — D225 Melanocytic nevi of trunk: Secondary | ICD-10-CM | POA: Diagnosis not present

## 2021-06-03 DIAGNOSIS — L218 Other seborrheic dermatitis: Secondary | ICD-10-CM | POA: Diagnosis not present

## 2021-06-11 DIAGNOSIS — M25562 Pain in left knee: Secondary | ICD-10-CM | POA: Diagnosis not present

## 2021-06-11 DIAGNOSIS — M25561 Pain in right knee: Secondary | ICD-10-CM | POA: Diagnosis not present

## 2021-06-27 DIAGNOSIS — Z23 Encounter for immunization: Secondary | ICD-10-CM | POA: Diagnosis not present

## 2021-08-11 DIAGNOSIS — Z0000 Encounter for general adult medical examination without abnormal findings: Secondary | ICD-10-CM | POA: Diagnosis not present

## 2021-08-14 DIAGNOSIS — I10 Essential (primary) hypertension: Secondary | ICD-10-CM | POA: Diagnosis not present

## 2021-08-14 DIAGNOSIS — E7800 Pure hypercholesterolemia, unspecified: Secondary | ICD-10-CM | POA: Diagnosis not present

## 2021-08-14 DIAGNOSIS — M810 Age-related osteoporosis without current pathological fracture: Secondary | ICD-10-CM | POA: Diagnosis not present

## 2021-08-14 DIAGNOSIS — I700 Atherosclerosis of aorta: Secondary | ICD-10-CM | POA: Diagnosis not present

## 2021-12-01 DIAGNOSIS — M19049 Primary osteoarthritis, unspecified hand: Secondary | ICD-10-CM | POA: Diagnosis not present

## 2021-12-01 DIAGNOSIS — G5601 Carpal tunnel syndrome, right upper limb: Secondary | ICD-10-CM | POA: Diagnosis not present

## 2021-12-01 DIAGNOSIS — M79644 Pain in right finger(s): Secondary | ICD-10-CM | POA: Diagnosis not present

## 2021-12-07 DIAGNOSIS — J0100 Acute maxillary sinusitis, unspecified: Secondary | ICD-10-CM | POA: Diagnosis not present

## 2022-01-20 DIAGNOSIS — J3489 Other specified disorders of nose and nasal sinuses: Secondary | ICD-10-CM | POA: Diagnosis not present

## 2022-04-13 DIAGNOSIS — H2511 Age-related nuclear cataract, right eye: Secondary | ICD-10-CM | POA: Diagnosis not present

## 2022-04-13 DIAGNOSIS — H52202 Unspecified astigmatism, left eye: Secondary | ICD-10-CM | POA: Diagnosis not present

## 2022-05-06 DIAGNOSIS — R202 Paresthesia of skin: Secondary | ICD-10-CM | POA: Diagnosis not present

## 2022-05-06 DIAGNOSIS — M810 Age-related osteoporosis without current pathological fracture: Secondary | ICD-10-CM | POA: Diagnosis not present

## 2022-05-06 DIAGNOSIS — I10 Essential (primary) hypertension: Secondary | ICD-10-CM | POA: Diagnosis not present

## 2022-05-06 DIAGNOSIS — I700 Atherosclerosis of aorta: Secondary | ICD-10-CM | POA: Diagnosis not present

## 2022-05-06 DIAGNOSIS — E7800 Pure hypercholesterolemia, unspecified: Secondary | ICD-10-CM | POA: Diagnosis not present

## 2022-05-06 DIAGNOSIS — Z6820 Body mass index (BMI) 20.0-20.9, adult: Secondary | ICD-10-CM | POA: Diagnosis not present

## 2022-05-06 DIAGNOSIS — K219 Gastro-esophageal reflux disease without esophagitis: Secondary | ICD-10-CM | POA: Diagnosis not present

## 2022-05-07 ENCOUNTER — Other Ambulatory Visit: Payer: Self-pay | Admitting: Family Medicine

## 2022-05-07 DIAGNOSIS — Z1231 Encounter for screening mammogram for malignant neoplasm of breast: Secondary | ICD-10-CM

## 2022-05-10 ENCOUNTER — Ambulatory Visit
Admission: RE | Admit: 2022-05-10 | Discharge: 2022-05-10 | Disposition: A | Discharge status: Home / Self Care | Payer: Medicare Other | Source: Ambulatory Visit | Attending: Family Medicine | Admitting: Family Medicine

## 2022-05-10 DIAGNOSIS — Z1231 Encounter for screening mammogram for malignant neoplasm of breast: Secondary | ICD-10-CM | POA: Diagnosis not present

## 2022-06-09 DIAGNOSIS — D2271 Melanocytic nevi of right lower limb, including hip: Secondary | ICD-10-CM | POA: Diagnosis not present

## 2022-06-09 DIAGNOSIS — L814 Other melanin hyperpigmentation: Secondary | ICD-10-CM | POA: Diagnosis not present

## 2022-06-09 DIAGNOSIS — D2261 Melanocytic nevi of right upper limb, including shoulder: Secondary | ICD-10-CM | POA: Diagnosis not present

## 2022-06-09 DIAGNOSIS — L820 Inflamed seborrheic keratosis: Secondary | ICD-10-CM | POA: Diagnosis not present

## 2022-06-09 DIAGNOSIS — D2272 Melanocytic nevi of left lower limb, including hip: Secondary | ICD-10-CM | POA: Diagnosis not present

## 2022-06-09 DIAGNOSIS — L821 Other seborrheic keratosis: Secondary | ICD-10-CM | POA: Diagnosis not present

## 2022-06-09 DIAGNOSIS — D2262 Melanocytic nevi of left upper limb, including shoulder: Secondary | ICD-10-CM | POA: Diagnosis not present

## 2022-06-09 DIAGNOSIS — D225 Melanocytic nevi of trunk: Secondary | ICD-10-CM | POA: Diagnosis not present

## 2022-06-09 DIAGNOSIS — L218 Other seborrheic dermatitis: Secondary | ICD-10-CM | POA: Diagnosis not present

## 2022-07-07 DIAGNOSIS — G959 Disease of spinal cord, unspecified: Secondary | ICD-10-CM | POA: Diagnosis not present

## 2022-07-27 DIAGNOSIS — G959 Disease of spinal cord, unspecified: Secondary | ICD-10-CM | POA: Diagnosis not present

## 2022-07-27 DIAGNOSIS — M542 Cervicalgia: Secondary | ICD-10-CM | POA: Diagnosis not present

## 2022-08-02 DIAGNOSIS — G959 Disease of spinal cord, unspecified: Secondary | ICD-10-CM | POA: Diagnosis not present

## 2022-08-16 DIAGNOSIS — Z0000 Encounter for general adult medical examination without abnormal findings: Secondary | ICD-10-CM | POA: Diagnosis not present

## 2022-08-17 DIAGNOSIS — R202 Paresthesia of skin: Secondary | ICD-10-CM | POA: Diagnosis not present

## 2022-08-17 DIAGNOSIS — E785 Hyperlipidemia, unspecified: Secondary | ICD-10-CM | POA: Diagnosis not present

## 2022-08-18 DIAGNOSIS — I10 Essential (primary) hypertension: Secondary | ICD-10-CM | POA: Diagnosis not present

## 2022-08-18 DIAGNOSIS — K219 Gastro-esophageal reflux disease without esophagitis: Secondary | ICD-10-CM | POA: Diagnosis not present

## 2022-08-18 DIAGNOSIS — M810 Age-related osteoporosis without current pathological fracture: Secondary | ICD-10-CM | POA: Diagnosis not present

## 2022-08-18 DIAGNOSIS — Z23 Encounter for immunization: Secondary | ICD-10-CM | POA: Diagnosis not present

## 2022-08-18 DIAGNOSIS — M47892 Other spondylosis, cervical region: Secondary | ICD-10-CM | POA: Diagnosis not present

## 2022-08-18 DIAGNOSIS — E538 Deficiency of other specified B group vitamins: Secondary | ICD-10-CM | POA: Diagnosis not present

## 2022-08-18 DIAGNOSIS — Z6820 Body mass index (BMI) 20.0-20.9, adult: Secondary | ICD-10-CM | POA: Diagnosis not present

## 2022-09-02 IMAGING — MG MM DIGITAL SCREENING BILAT W/ TOMO AND CAD
8 series · 9 of 24 positions shown · non-contrast
Comparison: Previous exam(s).

CLINICAL DATA: Screening.

EXAM:
DIGITAL SCREENING BILATERAL MAMMOGRAM WITH TOMOSYNTHESIS AND CAD
TECHNIQUE: Bilateral screening digital craniocaudal and mediolateral oblique
mammograms were obtained. Bilateral screening digital breast
tomosynthesis was performed. The images were evaluated with
computer-aided detection.

[L CC synth-2D]
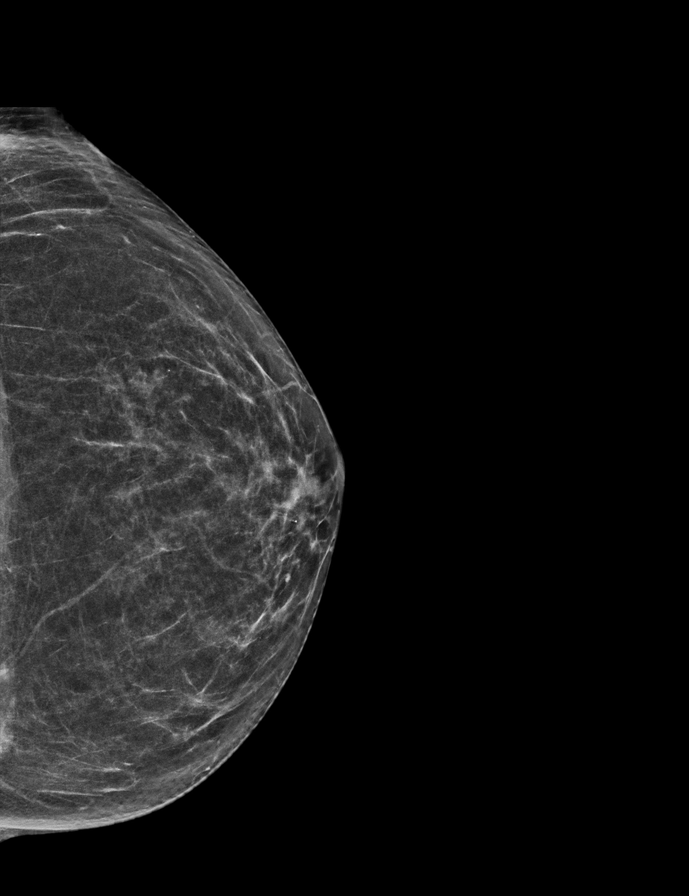

[R MLO synth-2D]
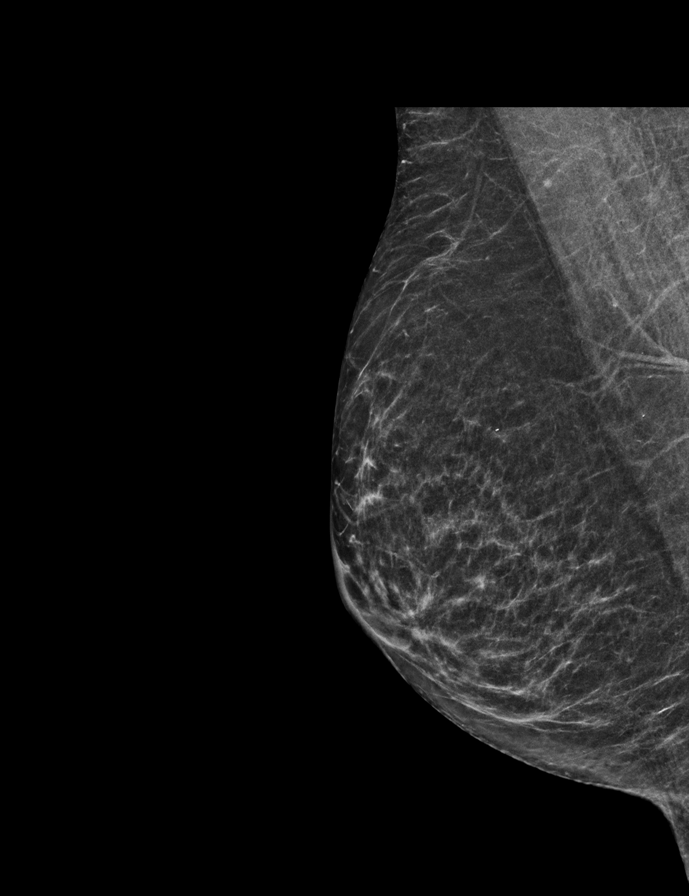

[L MLO synth-2D]
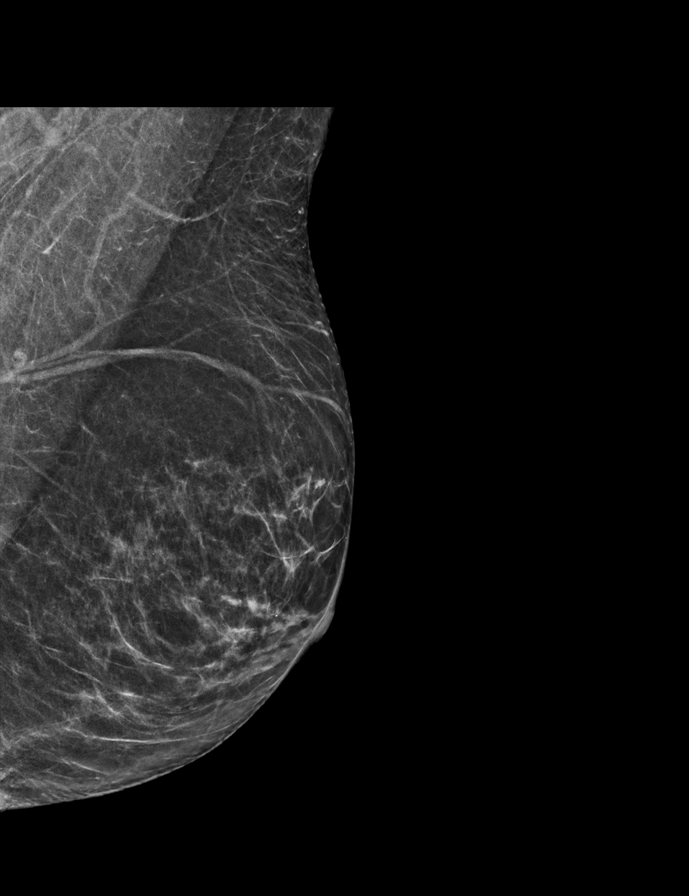

[R CC synth-2D]
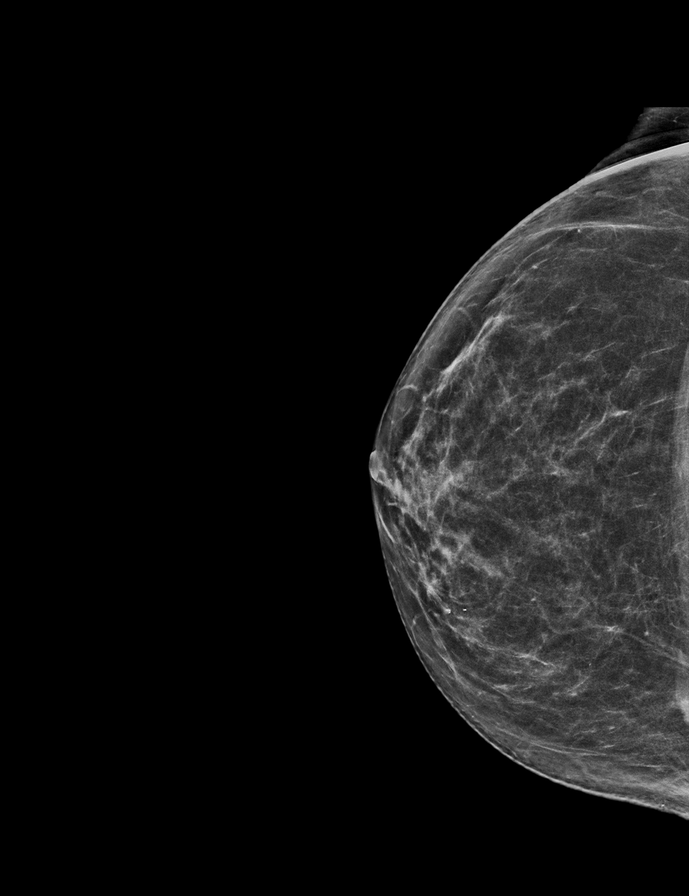

[L MLO tomo · 2 of 56 frames shown]
[frame 19/56]
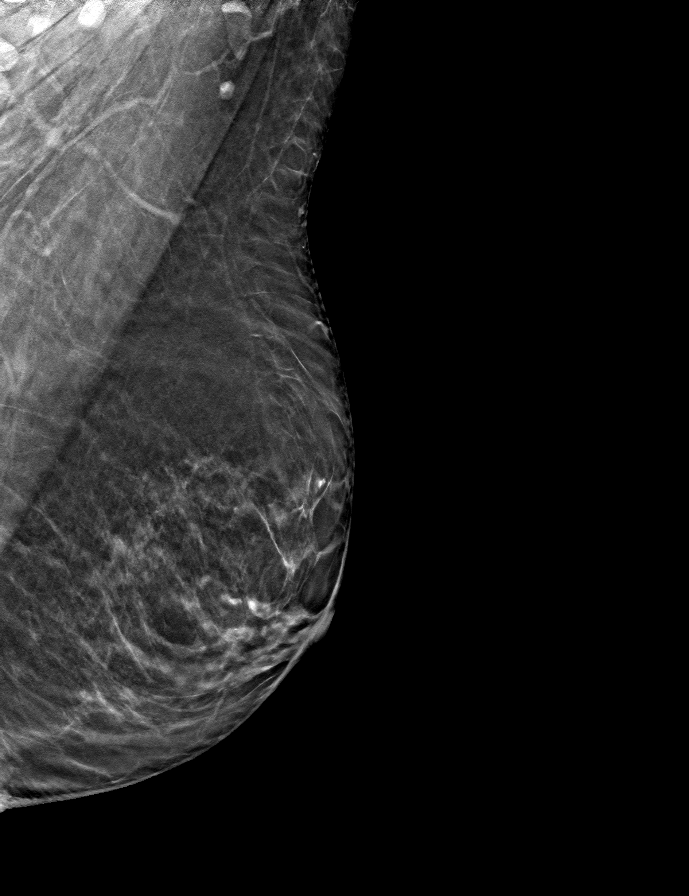
[frame 29/56]
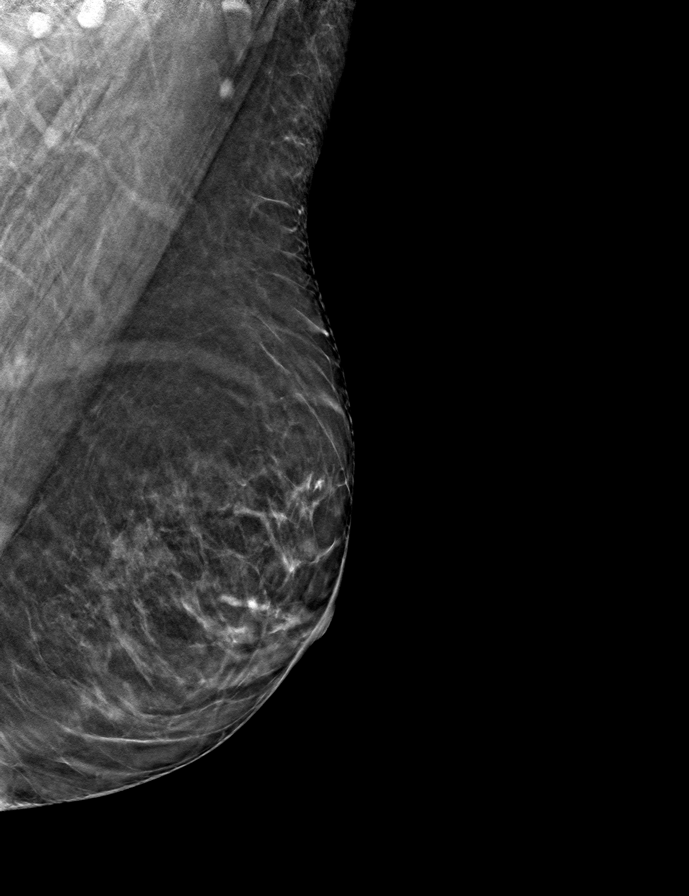

[L CC tomo · tomo slice 27/53.0]
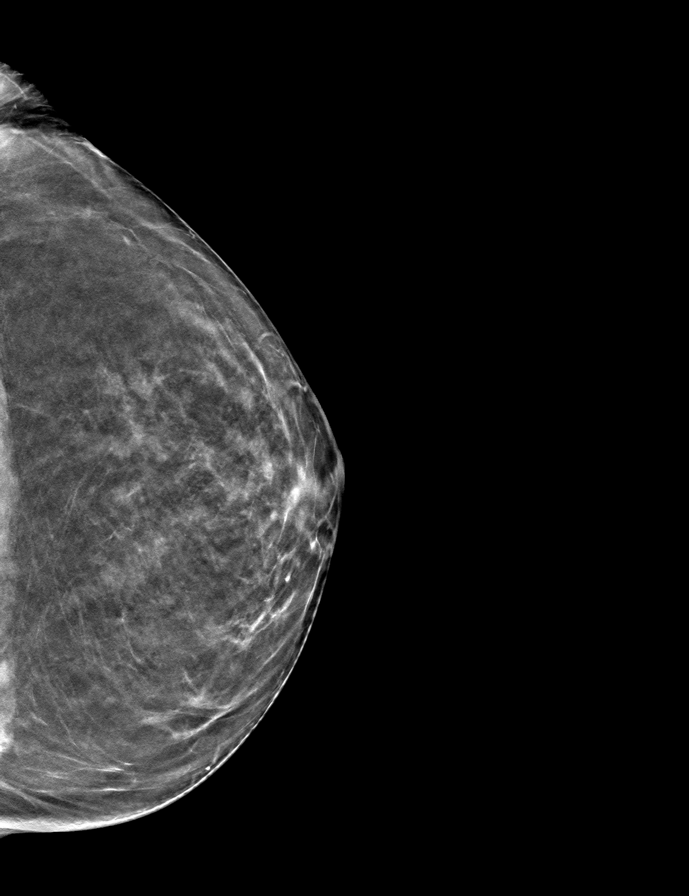

[R MLO tomo · tomo slice 26/51.0]
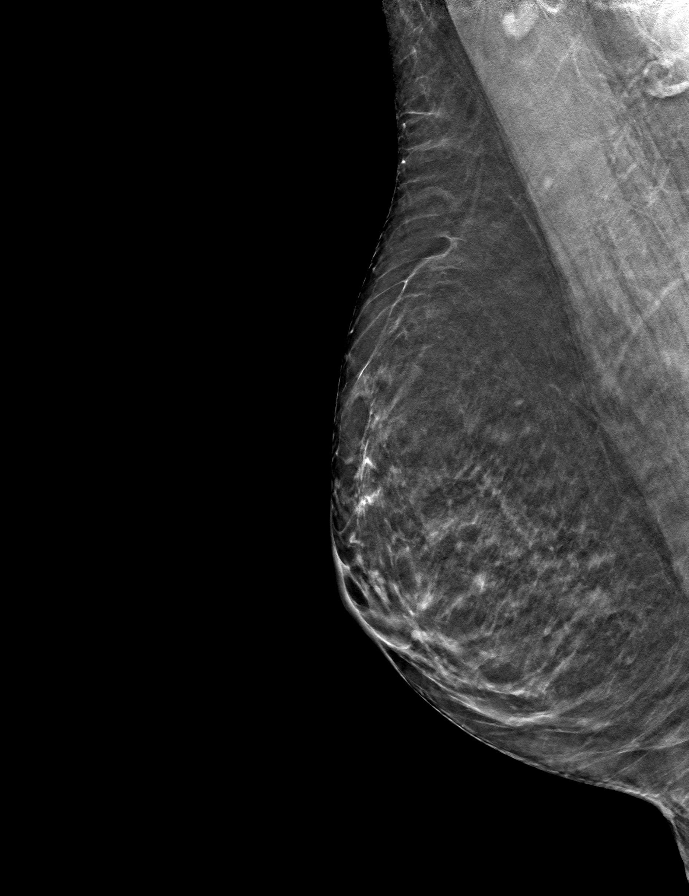

[R CC tomo · tomo slice 31/60.0]
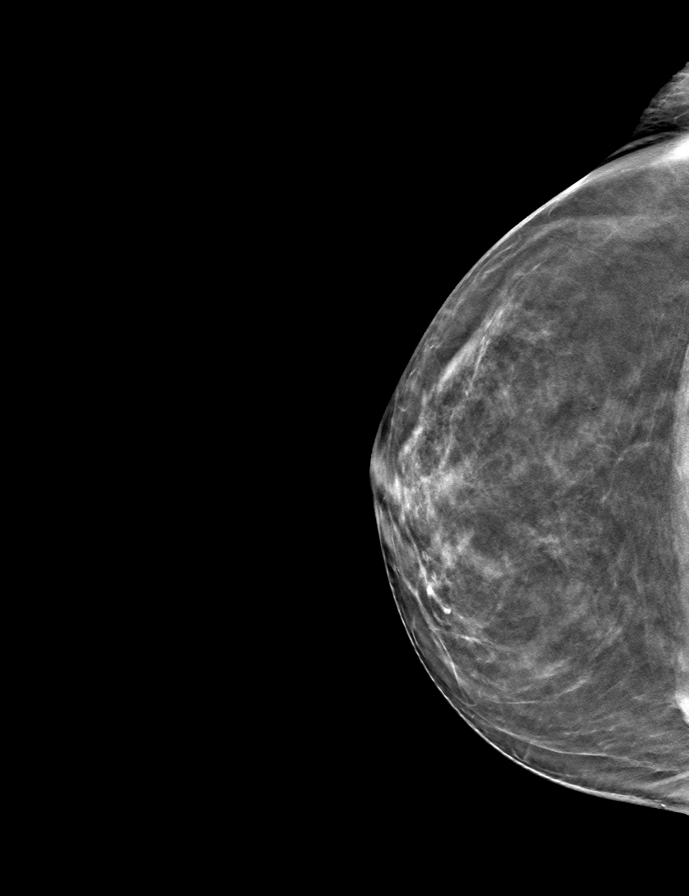

[9 of 24 positions shown; findings below may reference images not displayed]

ACR Breast Density Category b: There are scattered areas of
fibroglandular density.
FINDINGS: There are no findings suspicious for malignancy. The images were
evaluated with computer-aided detection.
IMPRESSION: No mammographic evidence of malignancy. A result letter of this
screening mammogram will be mailed directly to the patient.

RECOMMENDATION:
Screening mammogram in one year. (Code:[id number])

BI-RADS CATEGORY  1: Negative.

## 2022-09-20 DIAGNOSIS — M810 Age-related osteoporosis without current pathological fracture: Secondary | ICD-10-CM | POA: Diagnosis not present

## 2022-10-12 DIAGNOSIS — K080 Exfoliation of teeth due to systemic causes: Secondary | ICD-10-CM | POA: Diagnosis not present

## 2022-10-25 DIAGNOSIS — E538 Deficiency of other specified B group vitamins: Secondary | ICD-10-CM | POA: Diagnosis not present

## 2022-12-13 ENCOUNTER — Other Ambulatory Visit: Payer: Self-pay | Admitting: Neurological Surgery

## 2022-12-28 DIAGNOSIS — S3992XA Unspecified injury of lower back, initial encounter: Secondary | ICD-10-CM | POA: Diagnosis not present

## 2022-12-28 DIAGNOSIS — M549 Dorsalgia, unspecified: Secondary | ICD-10-CM | POA: Diagnosis not present

## 2023-01-19 DIAGNOSIS — M4716 Other spondylosis with myelopathy, lumbar region: Secondary | ICD-10-CM | POA: Diagnosis not present

## 2023-02-04 DIAGNOSIS — M546 Pain in thoracic spine: Secondary | ICD-10-CM | POA: Diagnosis not present

## 2023-02-09 DIAGNOSIS — M546 Pain in thoracic spine: Secondary | ICD-10-CM | POA: Diagnosis not present

## 2023-02-10 ENCOUNTER — Encounter (HOSPITAL_COMMUNITY): Payer: Self-pay

## 2023-02-10 NOTE — Pre-Procedure Instructions (Signed)
Surgical Instructions   Your procedure is scheduled on Monday, June 24th. Report to [facility] Main Entrance "A" at 12:20 P.M., then check in with the Admitting office. Any questions or running late day of surgery: call [phone]  Questions prior to your surgery date: call [phone], Monday-Friday, 8am-4pm. If you experience any cold or flu symptoms such as cough, fever, chills, shortness of breath, etc. between now and your scheduled surgery, please notify us at the above number.     Remember:  Do not eat after midnight the night before your surgery  You may drink clear liquids until 11:20 AM the morning of your surgery.   Clear liquids allowed are: Water, Non-Citrus Juices (without pulp), Carbonated Beverages, Clear Tea, Black Coffee Only (NO MILK, CREAM OR POWDERED CREAMER of any kind), and Gatorade.    Take these medicines the morning of surgery with A SIP OF WATER  cetirizine (ZYRTEC)  cycloSPORINE (RESTASIS)  Famotidine (ACID REDUCER PO)  rosuvastatin (CRESTOR)  sertraline (ZOLOFT)     One week prior to surgery, STOP taking any Aspirin (unless otherwise instructed by your surgeon) Aleve, Naproxen, Ibuprofen, Motrin, Advil, Goody's, BC's, all herbal medications, fish oil, and non-prescription vitamins.                     Do NOT Smoke (Tobacco/Vaping) for 24 hours prior to your procedure.  If you use a CPAP at night, you may bring your mask/headgear for your overnight stay.   You will be asked to remove any contacts, glasses, piercing's, hearing aid's, dentures/partials prior to surgery. Please bring cases for these items if needed.    Patients discharged the day of surgery will not be allowed to drive home, and someone needs to stay with them for 24 hours.  SURGICAL WAITING ROOM VISITATION Patients may have no more than 2 support people in the waiting area - these visitors may rotate.   Pre-op nurse will coordinate an appropriate time for 1 ADULT support person, who  may not rotate, to accompany patient in pre-op.  Children under the age of 34 must have an adult with them who is not the patient and must remain in the main waiting area with an adult.  If the patient needs to stay at the hospital during part of their recovery, the visitor guidelines for inpatient rooms apply.  Please refer to the [facility] website for the visitor guidelines for any additional information.   If you received a COVID test during your pre-op visit  it is requested that you wear a mask when out in public, stay away from anyone that may not be feeling well and notify your surgeon if you develop symptoms. If you have been in contact with anyone that has tested positive in the last 10 days please notify you surgeon.      Pre-operative 5 CHG Bath Instructions   You can play a key role in reducing the risk of infection after surgery. Your skin needs to be as free of germs as possible. You can reduce the number of germs on your skin by washing with CHG (chlorhexidine gluconate) soap before surgery. CHG is an antiseptic soap that kills germs and continues to kill germs even after washing.   DO NOT use if you have an allergy to chlorhexidine/CHG or antibacterial soaps. If your skin becomes reddened or irritated, stop using the CHG and notify one of our RNs at [phone].   Please shower with the CHG soap starting 4 days  before surgery using the following schedule:     Please keep in mind the following:  DO NOT shave, including legs and underarms, starting the day of your first shower.   You may shave your face at any point before/day of surgery.  Place clean sheets on your bed the day you start using CHG soap. Use a clean washcloth (not used since being washed) for each shower. DO NOT sleep with pets once you start using the CHG.   CHG Shower Instructions:  If you choose to wash your hair and private area, wash first with your normal shampoo/soap.  After you use  shampoo/soap, rinse your hair and body thoroughly to remove shampoo/soap residue.  Turn the water OFF and apply about 3 tablespoons (45 ml) of CHG soap to a CLEAN washcloth.  Apply CHG soap ONLY FROM YOUR NECK DOWN TO YOUR TOES (washing for 3-5 minutes)  DO NOT use CHG soap on face, private areas, open wounds, or sores.  Pay special attention to the area where your surgery is being performed.  If you are having back surgery, having someone wash your back for you may be helpful. Wait 2 minutes after CHG soap is applied, then you may rinse off the CHG soap.  Pat dry with a clean towel  Put on clean clothes/pajamas   If you choose to wear lotion, please use ONLY the CHG-compatible lotions on the back of this paper.   Additional instructions for the day of surgery: DO NOT APPLY any lotions, deodorants, cologne, or perfumes.   Do not bring valuables to the hospital. [facility] is not responsible for any belongings/valuables. Do not wear nail polish, gel polish, artificial nails, or any other type of covering on natural nails (fingers and toes) Do not wear jewelry or makeup Put on clean/comfortable clothes.  Please brush your teeth.  Ask your nurse before applying any prescription medications to the skin.     CHG Compatible Lotions   Aveeno Moisturizing lotion  Cetaphil Moisturizing Cream  Cetaphil Moisturizing Lotion  Clairol Herbal Essence Moisturizing Lotion, Dry Skin  Clairol Herbal Essence Moisturizing Lotion, Extra Dry Skin  Clairol Herbal Essence Moisturizing Lotion, Normal Skin  Curel Age Defying Therapeutic Moisturizing Lotion with Alpha Hydroxy  Curel Extreme Care Body Lotion  Curel Soothing Hands Moisturizing Hand Lotion  Curel Therapeutic Moisturizing Cream, Fragrance-Free  Curel Therapeutic Moisturizing Lotion, Fragrance-Free  Curel Therapeutic Moisturizing Lotion, Original Formula  Eucerin Daily Replenishing Lotion  Eucerin Dry Skin Therapy Plus Alpha Hydroxy Crme   Eucerin Dry Skin Therapy Plus Alpha Hydroxy Lotion  Eucerin Original Crme  Eucerin Original Lotion  Eucerin Plus Crme Eucerin Plus Lotion  Eucerin TriLipid Replenishing Lotion  Keri Anti-Bacterial Hand Lotion  Keri Deep Conditioning Original Lotion Dry Skin Formula Softly Scented  Keri Deep Conditioning Original Lotion, Fragrance Free Sensitive Skin Formula  Keri Lotion Fast Absorbing Fragrance Free Sensitive Skin Formula  Keri Lotion Fast Absorbing Softly Scented Dry Skin Formula  Keri Original Lotion  Keri Skin Renewal Lotion Keri Silky Smooth Lotion  Keri Silky Smooth Sensitive Skin Lotion  Nivea Body Creamy Conditioning Oil  Nivea Body Extra Enriched Lotion  Nivea Body Original Lotion  Nivea Body Sheer Moisturizing Lotion Nivea Crme  Nivea Skin Firming Lotion  NutraDerm 30 Skin Lotion  NutraDerm Skin Lotion  NutraDerm Therapeutic Skin Cream  NutraDerm Therapeutic Skin Lotion  ProShield Protective Hand Cream  Provon moisturizing lotion  Please read over the following fact sheets that you were given.

## 2023-02-11 ENCOUNTER — Other Ambulatory Visit: Payer: Self-pay

## 2023-02-11 ENCOUNTER — Encounter (HOSPITAL_COMMUNITY): Payer: Self-pay

## 2023-02-11 ENCOUNTER — Encounter (HOSPITAL_COMMUNITY)
Admission: RE | Admit: 2023-02-11 | Discharge: 2023-02-11 | Disposition: A | Discharge status: Home / Self Care | Payer: Medicare Other | Source: Ambulatory Visit | Attending: Neurological Surgery | Admitting: Neurological Surgery

## 2023-02-11 VITALS — BP 147/86 | HR 73 | Temp 98.2°F | Resp 17 | Ht 66.0 in | Wt 126.4 lb

## 2023-02-11 DIAGNOSIS — K760 Fatty (change of) liver, not elsewhere classified: Secondary | ICD-10-CM | POA: Diagnosis not present

## 2023-02-11 DIAGNOSIS — Z01818 Encounter for other preprocedural examination: Secondary | ICD-10-CM | POA: Insufficient documentation

## 2023-02-11 DIAGNOSIS — I10 Essential (primary) hypertension: Secondary | ICD-10-CM | POA: Insufficient documentation

## 2023-02-11 HISTORY — DX: Anxiety disorder, unspecified: F41.9

## 2023-02-11 HISTORY — DX: Nontoxic multinodular goiter: E04.2

## 2023-02-11 HISTORY — DX: Fatty (change of) liver, not elsewhere classified: K76.0

## 2023-02-11 HISTORY — DX: Essential (primary) hypertension: I10

## 2023-02-11 HISTORY — DX: Unspecified asthma, uncomplicated: J45.909

## 2023-02-11 HISTORY — DX: Gastro-esophageal reflux disease without esophagitis: K21.9

## 2023-02-11 LAB — SURGICAL PCR SCREEN
MRSA, PCR: NEGATIVE
Staphylococcus aureus: NEGATIVE

## 2023-02-11 LAB — CBC
HCT: 38.9 % (ref 36.0–46.0)
Hemoglobin: 12.9 g/dL (ref 12.0–15.0)
MCH: 30.5 pg (ref 26.0–34.0)
MCHC: 33.2 g/dL (ref 30.0–36.0)
MCV: 92 fL (ref 80.0–100.0)
Platelets: 239 10*3/uL (ref 150–400)
RBC: 4.23 MIL/uL (ref 3.87–5.11)
RDW: 12.1 % (ref 11.5–15.5)
WBC: 5.9 10*3/uL (ref 4.0–10.5)
nRBC: 0 % (ref 0.0–0.2)

## 2023-02-11 LAB — COMPREHENSIVE METABOLIC PANEL
ALT: 13 U/L (ref 0–44)
AST: 17 U/L (ref 15–41)
Albumin: 4.1 g/dL (ref 3.5–5.0)
Alkaline Phosphatase: 48 U/L (ref 38–126)
Anion gap: 8 (ref 5–15)
BUN: 17 mg/dL (ref 8–23)
CO2: 25 mmol/L (ref 22–32)
Calcium: 9.4 mg/dL (ref 8.9–10.3)
Chloride: 106 mmol/L (ref 98–111)
Creatinine, Ser: 0.92 mg/dL (ref 0.44–1.00)
GFR, Estimated: >60 mL/min (ref >60)
Glucose, Bld: 90 mg/dL (ref 70–99)
Potassium: 4.5 mmol/L (ref 3.5–5.1)
Sodium: 139 mmol/L (ref 135–145)
Total Bilirubin: 1 mg/dL (ref 0.3–1.2)
Total Protein: 6.3 g/dL — ABNORMAL LOW (ref 6.5–8.1)

## 2023-02-11 LAB — TYPE AND SCREEN
ABO/RH(D): A POS
Antibody Screen: NEGATIVE

## 2023-02-11 NOTE — Progress Notes (Signed)
PCP - Dr. Sigmund Hazel Cardiologist - denies  PPM/ICD - denies   Chest x-ray - 03/11/17 EKG - 02/11/23 Stress Test - denies ECHO - denies Cardiac Cath - denies  Sleep Study - denies   DM- denies  ASA/Blood Thinner Instructions: n/a   ERAS Protcol - yes, no drink   COVID TEST- n/a   Anesthesia review: no  Patient denies shortness of breath, fever, cough and chest pain at PAT appointment   All instructions explained to the patient, with a verbal understanding of the material. Patient agrees to go over the instructions while at home for a better understanding. The opportunity to ask questions was provided.

## 2023-02-16 DIAGNOSIS — Z6820 Body mass index (BMI) 20.0-20.9, adult: Secondary | ICD-10-CM | POA: Diagnosis not present

## 2023-02-16 DIAGNOSIS — I10 Essential (primary) hypertension: Secondary | ICD-10-CM | POA: Diagnosis not present

## 2023-02-16 DIAGNOSIS — I700 Atherosclerosis of aorta: Secondary | ICD-10-CM | POA: Diagnosis not present

## 2023-02-16 DIAGNOSIS — E7800 Pure hypercholesterolemia, unspecified: Secondary | ICD-10-CM | POA: Diagnosis not present

## 2023-02-16 DIAGNOSIS — E538 Deficiency of other specified B group vitamins: Secondary | ICD-10-CM | POA: Diagnosis not present

## 2023-02-16 DIAGNOSIS — F325 Major depressive disorder, single episode, in full remission: Secondary | ICD-10-CM | POA: Diagnosis not present

## 2023-02-17 DIAGNOSIS — M546 Pain in thoracic spine: Secondary | ICD-10-CM | POA: Diagnosis not present

## 2023-02-18 NOTE — Progress Notes (Signed)
Patient was called to inform that the surgery time for Monday was changed to 12:50 o'clock. Patient wasn't available and this writer left a message. Patient was instructed to be at the hospital at 10:50 o'clock and stop drinking clear liquids at 09:50 o'clock.

## 2023-02-21 ENCOUNTER — Encounter (HOSPITAL_COMMUNITY)
Admission: RE | Disposition: A | Discharge status: Home / Self Care | Payer: Self-pay | Source: Home / Self Care | Attending: Neurological Surgery

## 2023-02-21 ENCOUNTER — Inpatient Hospital Stay (HOSPITAL_COMMUNITY)
Admission: RE | Admit: 2023-02-21 | Discharge: 2023-02-22 | DRG: 473 | Disposition: A | Discharge status: Home / Self Care | Payer: Medicare Other | Attending: Neurological Surgery | Admitting: Neurological Surgery

## 2023-02-21 ENCOUNTER — Inpatient Hospital Stay (HOSPITAL_COMMUNITY): Payer: Medicare Other | Admitting: Anesthesiology

## 2023-02-21 ENCOUNTER — Other Ambulatory Visit: Payer: Self-pay

## 2023-02-21 ENCOUNTER — Inpatient Hospital Stay (HOSPITAL_COMMUNITY): Payer: Medicare Other

## 2023-02-21 ENCOUNTER — Encounter (HOSPITAL_COMMUNITY): Payer: Self-pay | Admitting: Neurological Surgery

## 2023-02-21 DIAGNOSIS — Z880 Allergy status to penicillin: Secondary | ICD-10-CM

## 2023-02-21 DIAGNOSIS — K219 Gastro-esophageal reflux disease without esophagitis: Secondary | ICD-10-CM | POA: Diagnosis not present

## 2023-02-21 DIAGNOSIS — F32A Depression, unspecified: Secondary | ICD-10-CM | POA: Diagnosis not present

## 2023-02-21 DIAGNOSIS — K760 Fatty (change of) liver, not elsewhere classified: Secondary | ICD-10-CM | POA: Diagnosis present

## 2023-02-21 DIAGNOSIS — M4712 Other spondylosis with myelopathy, cervical region: Principal | ICD-10-CM | POA: Diagnosis present

## 2023-02-21 DIAGNOSIS — M2578 Osteophyte, vertebrae: Secondary | ICD-10-CM | POA: Diagnosis present

## 2023-02-21 DIAGNOSIS — M4722 Other spondylosis with radiculopathy, cervical region: Secondary | ICD-10-CM | POA: Diagnosis not present

## 2023-02-21 DIAGNOSIS — Z87891 Personal history of nicotine dependence: Secondary | ICD-10-CM

## 2023-02-21 DIAGNOSIS — Z01818 Encounter for other preprocedural examination: Secondary | ICD-10-CM | POA: Diagnosis not present

## 2023-02-21 DIAGNOSIS — I10 Essential (primary) hypertension: Secondary | ICD-10-CM | POA: Diagnosis present

## 2023-02-21 DIAGNOSIS — F419 Anxiety disorder, unspecified: Secondary | ICD-10-CM | POA: Diagnosis present

## 2023-02-21 DIAGNOSIS — M170 Bilateral primary osteoarthritis of knee: Secondary | ICD-10-CM | POA: Diagnosis present

## 2023-02-21 DIAGNOSIS — Z803 Family history of malignant neoplasm of breast: Secondary | ICD-10-CM

## 2023-02-21 DIAGNOSIS — M810 Age-related osteoporosis without current pathological fracture: Secondary | ICD-10-CM | POA: Diagnosis not present

## 2023-02-21 DIAGNOSIS — Z882 Allergy status to sulfonamides status: Secondary | ICD-10-CM | POA: Diagnosis not present

## 2023-02-21 DIAGNOSIS — M4155 Other secondary scoliosis, thoracolumbar region: Secondary | ICD-10-CM | POA: Diagnosis present

## 2023-02-21 HISTORY — PX: ANTERIOR CERVICAL DECOMP/DISCECTOMY FUSION: SHX1161

## 2023-02-21 LAB — ABO/RH: ABO/RH(D): A POS

## 2023-02-21 SURGERY — ANTERIOR CERVICAL DECOMPRESSION/DISCECTOMY FUSION 3 LEVELS
Anesthesia: General

## 2023-02-21 MED ORDER — ROSUVASTATIN CALCIUM 5 MG PO TABS
5.0000 mg | ORAL_TABLET | ORAL | Status: DC
  Administered 2023-02-22: 5 mg via ORAL
  Filled 2023-02-21: qty 1

## 2023-02-21 MED ORDER — SUGAMMADEX SODIUM 200 MG/2ML IV SOLN
INTRAVENOUS | Status: DC | PRN
  Administered 2023-02-21: 150 mg via INTRAVENOUS

## 2023-02-21 MED ORDER — FENTANYL CITRATE (PF) 250 MCG/5ML IJ SOLN
INTRAMUSCULAR | Status: AC
  Filled 2023-02-21: qty 5

## 2023-02-21 MED ORDER — THROMBIN 5000 UNITS EX SOLR
OROMUCOSAL | Status: DC | PRN
  Administered 2023-02-21: 5 mL via TOPICAL

## 2023-02-21 MED ORDER — OXYCODONE HCL 5 MG/5ML PO SOLN: 5.0000 mg | Freq: Once | ORAL | Status: AC | PRN

## 2023-02-21 MED ORDER — ROCURONIUM BROMIDE 10 MG/ML (PF) SYRINGE
PREFILLED_SYRINGE | INTRAVENOUS | Status: AC
  Filled 2023-02-21: qty 10

## 2023-02-21 MED ORDER — LIDOCAINE-EPINEPHRINE 1 %-1:100000 IJ SOLN
INTRAMUSCULAR | Status: DC | PRN
  Administered 2023-02-21: 7 mL via INTRADERMAL

## 2023-02-21 MED ORDER — MIDAZOLAM HCL 2 MG/2ML IJ SOLN
INTRAMUSCULAR | Status: DC | PRN
  Administered 2023-02-21: 2 mg via INTRAVENOUS

## 2023-02-21 MED ORDER — FENTANYL CITRATE (PF) 100 MCG/2ML IJ SOLN
INTRAMUSCULAR | Status: AC
  Filled 2023-02-21: qty 2

## 2023-02-21 MED ORDER — ALBUMIN HUMAN 5 % IV SOLN: INTRAVENOUS | Status: DC | PRN

## 2023-02-21 MED ORDER — METHOCARBAMOL 500 MG PO TABS
ORAL_TABLET | ORAL | Status: AC
  Filled 2023-02-21: qty 1

## 2023-02-21 MED ORDER — ONDANSETRON HCL 4 MG/2ML IJ SOLN
INTRAMUSCULAR | Status: DC | PRN
  Administered 2023-02-21: 4 mg via INTRAVENOUS

## 2023-02-21 MED ORDER — CEFAZOLIN SODIUM-DEXTROSE 2-4 GM/100ML-% IV SOLN
2.0000 g | Freq: Three times a day (TID) | INTRAVENOUS | Status: AC
  Administered 2023-02-21 – 2023-02-22 (×2): 2 g via INTRAVENOUS
  Filled 2023-02-21 (×2): qty 100

## 2023-02-21 MED ORDER — DOCUSATE SODIUM 100 MG PO CAPS
100.0000 mg | ORAL_CAPSULE | Freq: Two times a day (BID) | ORAL | Status: DC
  Administered 2023-02-21 – 2023-02-22 (×2): 100 mg via ORAL
  Filled 2023-02-21 (×2): qty 1

## 2023-02-21 MED ORDER — FENTANYL CITRATE (PF) 250 MCG/5ML IJ SOLN
INTRAMUSCULAR | Status: DC | PRN
  Administered 2023-02-21: 25 ug via INTRAVENOUS
  Administered 2023-02-21: 100 ug via INTRAVENOUS
  Administered 2023-02-21: 50 ug via INTRAVENOUS
  Administered 2023-02-21 (×3): 25 ug via INTRAVENOUS

## 2023-02-21 MED ORDER — LIDOCAINE 2% (20 MG/ML) 5 ML SYRINGE
INTRAMUSCULAR | Status: DC | PRN
  Administered 2023-02-21: 100 mg via INTRAVENOUS

## 2023-02-21 MED ORDER — PHENYLEPHRINE 80 MCG/ML (10ML) SYRINGE FOR IV PUSH (FOR BLOOD PRESSURE SUPPORT)
PREFILLED_SYRINGE | INTRAVENOUS | Status: AC
  Filled 2023-02-21: qty 10

## 2023-02-21 MED ORDER — CHLORHEXIDINE GLUCONATE CLOTH 2 % EX PADS: 6.0000 | MEDICATED_PAD | Freq: Once | CUTANEOUS | Status: DC

## 2023-02-21 MED ORDER — ROCURONIUM BROMIDE 10 MG/ML (PF) SYRINGE
PREFILLED_SYRINGE | INTRAVENOUS | Status: DC | PRN
  Administered 2023-02-21: 30 mg via INTRAVENOUS
  Administered 2023-02-21: 70 mg via INTRAVENOUS

## 2023-02-21 MED ORDER — THROMBIN 5000 UNITS EX SOLR
CUTANEOUS | Status: AC
  Filled 2023-02-21: qty 5000

## 2023-02-21 MED ORDER — LACTATED RINGERS IV SOLN: INTRAVENOUS | Status: DC

## 2023-02-21 MED ORDER — VANCOMYCIN HCL IN DEXTROSE 1-5 GM/200ML-% IV SOLN
1000.0000 mg | INTRAVENOUS | Status: AC
  Administered 2023-02-21: 1000 mg via INTRAVENOUS
  Filled 2023-02-21: qty 200

## 2023-02-21 MED ORDER — PROPOFOL 10 MG/ML IV BOLUS
INTRAVENOUS | Status: DC | PRN
  Administered 2023-02-21: 130 mg via INTRAVENOUS

## 2023-02-21 MED ORDER — ACETAMINOPHEN 10 MG/ML IV SOLN
INTRAVENOUS | Status: AC
  Filled 2023-02-21: qty 100

## 2023-02-21 MED ORDER — ALUM & MAG HYDROXIDE-SIMETH 200-200-20 MG/5ML PO SUSP: 30.0000 mL | Freq: Four times a day (QID) | ORAL | Status: DC | PRN

## 2023-02-21 MED ORDER — ACETAMINOPHEN 650 MG RE SUPP: 650.0000 mg | RECTAL | Status: DC | PRN

## 2023-02-21 MED ORDER — ORAL CARE MOUTH RINSE: 15.0000 mL | Freq: Once | OROMUCOSAL | Status: AC

## 2023-02-21 MED ORDER — FENTANYL CITRATE (PF) 100 MCG/2ML IJ SOLN
25.0000 ug | INTRAMUSCULAR | Status: DC | PRN
  Administered 2023-02-21 (×2): 50 ug via INTRAVENOUS

## 2023-02-21 MED ORDER — OXYCODONE-ACETAMINOPHEN 5-325 MG PO TABS
1.0000 | ORAL_TABLET | ORAL | Status: DC | PRN
  Administered 2023-02-21 – 2023-02-22 (×3): 2 via ORAL
  Administered 2023-02-22: 1 via ORAL
  Filled 2023-02-21: qty 1
  Filled 2023-02-21 (×3): qty 2

## 2023-02-21 MED ORDER — SENNA 8.6 MG PO TABS
1.0000 | ORAL_TABLET | Freq: Two times a day (BID) | ORAL | Status: DC
  Administered 2023-02-21 – 2023-02-22 (×2): 8.6 mg via ORAL
  Filled 2023-02-21 (×2): qty 1

## 2023-02-21 MED ORDER — FLEET ENEMA 7-19 GM/118ML RE ENEM: 1.0000 | ENEMA | Freq: Once | RECTAL | Status: DC | PRN

## 2023-02-21 MED ORDER — BISACODYL 10 MG RE SUPP: 10.0000 mg | Freq: Every day | RECTAL | Status: DC | PRN

## 2023-02-21 MED ORDER — OXYCODONE HCL 5 MG PO TABS
5.0000 mg | ORAL_TABLET | Freq: Once | ORAL | Status: AC | PRN
  Administered 2023-02-21: 5 mg via ORAL

## 2023-02-21 MED ORDER — ACETAMINOPHEN 500 MG PO TABS
1000.0000 mg | ORAL_TABLET | Freq: Once | ORAL | Status: AC
  Administered 2023-02-21: 1000 mg via ORAL

## 2023-02-21 MED ORDER — BUPIVACAINE HCL (PF) 0.5 % IJ SOLN
INTRAMUSCULAR | Status: DC | PRN
  Administered 2023-02-21: 10 mL

## 2023-02-21 MED ORDER — CHLORHEXIDINE GLUCONATE 0.12 % MT SOLN
OROMUCOSAL | Status: AC
  Filled 2023-02-21: qty 15

## 2023-02-21 MED ORDER — METHOCARBAMOL 1000 MG/10ML IJ SOLN: 500.0000 mg | Freq: Four times a day (QID) | INTRAVENOUS | Status: DC | PRN

## 2023-02-21 MED ORDER — ACETAMINOPHEN 325 MG PO TABS: 650.0000 mg | ORAL_TABLET | ORAL | Status: DC | PRN

## 2023-02-21 MED ORDER — PHENOL 1.4 % MT LIQD: 1.0000 | OROMUCOSAL | Status: DC | PRN

## 2023-02-21 MED ORDER — SODIUM CHLORIDE 0.9 % IV SOLN: 250.0000 mL | INTRAVENOUS | Status: DC

## 2023-02-21 MED ORDER — ONDANSETRON HCL 4 MG PO TABS: 4.0000 mg | ORAL_TABLET | Freq: Four times a day (QID) | ORAL | Status: DC | PRN

## 2023-02-21 MED ORDER — SODIUM CHLORIDE 0.9% FLUSH: 3.0000 mL | INTRAVENOUS | Status: DC | PRN

## 2023-02-21 MED ORDER — ACETAMINOPHEN 500 MG PO TABS
ORAL_TABLET | ORAL | Status: AC
  Filled 2023-02-21: qty 2

## 2023-02-21 MED ORDER — MIDAZOLAM HCL 2 MG/2ML IJ SOLN
INTRAMUSCULAR | Status: AC
  Filled 2023-02-21: qty 2

## 2023-02-21 MED ORDER — ONDANSETRON HCL 4 MG/2ML IJ SOLN: 4.0000 mg | Freq: Four times a day (QID) | INTRAMUSCULAR | Status: DC | PRN

## 2023-02-21 MED ORDER — ONDANSETRON HCL 4 MG/2ML IJ SOLN
INTRAMUSCULAR | Status: AC
  Filled 2023-02-21: qty 2

## 2023-02-21 MED ORDER — LIDOCAINE-EPINEPHRINE 1 %-1:100000 IJ SOLN
INTRAMUSCULAR | Status: AC
  Filled 2023-02-21: qty 1

## 2023-02-21 MED ORDER — ACETAMINOPHEN 10 MG/ML IV SOLN
1000.0000 mg | Freq: Once | INTRAVENOUS | Status: DC | PRN
  Administered 2023-02-21: 1000 mg via INTRAVENOUS

## 2023-02-21 MED ORDER — DIPHENHYDRAMINE HCL 25 MG PO CAPS: 25.0000 mg | ORAL_CAPSULE | Freq: Every evening | ORAL | Status: DC | PRN

## 2023-02-21 MED ORDER — SERTRALINE HCL 50 MG PO TABS
50.0000 mg | ORAL_TABLET | Freq: Every day | ORAL | Status: DC
  Administered 2023-02-22: 50 mg via ORAL
  Filled 2023-02-21: qty 1

## 2023-02-21 MED ORDER — PROPOFOL 10 MG/ML IV BOLUS
INTRAVENOUS | Status: AC
  Filled 2023-02-21: qty 20

## 2023-02-21 MED ORDER — OXYCODONE HCL 5 MG PO TABS
ORAL_TABLET | ORAL | Status: AC
  Filled 2023-02-21: qty 1

## 2023-02-21 MED ORDER — DEXAMETHASONE SODIUM PHOSPHATE 10 MG/ML IJ SOLN
INTRAMUSCULAR | Status: AC
  Filled 2023-02-21: qty 1

## 2023-02-21 MED ORDER — MENTHOL 3 MG MT LOZG
1.0000 | LOZENGE | OROMUCOSAL | Status: DC | PRN
  Filled 2023-02-21: qty 9

## 2023-02-21 MED ORDER — DIPHENHYDRAMINE-APAP (SLEEP) 38-500 MG PO TABS: 1.0000 | ORAL_TABLET | Freq: Every evening | ORAL | Status: DC | PRN

## 2023-02-21 MED ORDER — ACETAMINOPHEN 500 MG PO TABS: 500.0000 mg | ORAL_TABLET | Freq: Every evening | ORAL | Status: DC | PRN

## 2023-02-21 MED ORDER — LOSARTAN POTASSIUM 50 MG PO TABS
50.0000 mg | ORAL_TABLET | Freq: Every day | ORAL | Status: DC
  Filled 2023-02-21: qty 1

## 2023-02-21 MED ORDER — BUPIVACAINE HCL (PF) 0.5 % IJ SOLN
INTRAMUSCULAR | Status: AC
  Filled 2023-02-21: qty 30

## 2023-02-21 MED ORDER — PHENYLEPHRINE HCL-NACL 20-0.9 MG/250ML-% IV SOLN
INTRAVENOUS | Status: DC | PRN
  Administered 2023-02-21: 40 ug/min via INTRAVENOUS

## 2023-02-21 MED ORDER — DEXAMETHASONE SODIUM PHOSPHATE 10 MG/ML IJ SOLN
INTRAMUSCULAR | Status: DC | PRN
  Administered 2023-02-21: 10 mg via INTRAVENOUS

## 2023-02-21 MED ORDER — LORATADINE 10 MG PO TABS
10.0000 mg | ORAL_TABLET | Freq: Every day | ORAL | Status: DC
  Administered 2023-02-21 – 2023-02-22 (×2): 10 mg via ORAL
  Filled 2023-02-21 (×2): qty 1

## 2023-02-21 MED ORDER — SODIUM CHLORIDE 0.9% FLUSH: 3.0000 mL | Freq: Two times a day (BID) | INTRAVENOUS | Status: DC

## 2023-02-21 MED ORDER — CHLORHEXIDINE GLUCONATE 0.12 % MT SOLN
15.0000 mL | Freq: Once | OROMUCOSAL | Status: AC
  Administered 2023-02-21: 15 mL via OROMUCOSAL

## 2023-02-21 MED ORDER — METHOCARBAMOL 500 MG PO TABS
500.0000 mg | ORAL_TABLET | Freq: Four times a day (QID) | ORAL | Status: DC | PRN
  Administered 2023-02-21 – 2023-02-22 (×3): 500 mg via ORAL
  Filled 2023-02-21 (×2): qty 1

## 2023-02-21 MED ORDER — LIDOCAINE 2% (20 MG/ML) 5 ML SYRINGE
INTRAMUSCULAR | Status: AC
  Filled 2023-02-21: qty 5

## 2023-02-21 MED ORDER — MORPHINE SULFATE (PF) 2 MG/ML IV SOLN: 2.0000 mg | INTRAVENOUS | Status: DC | PRN

## 2023-02-21 MED ORDER — 0.9 % SODIUM CHLORIDE (POUR BTL) OPTIME
TOPICAL | Status: DC | PRN
  Administered 2023-02-21: 1000 mL

## 2023-02-21 MED ORDER — CYCLOSPORINE 0.05 % OP EMUL
1.0000 [drp] | Freq: Two times a day (BID) | OPHTHALMIC | Status: DC
  Administered 2023-02-21 – 2023-02-22 (×2): 1 [drp] via OPHTHALMIC
  Filled 2023-02-21 (×4): qty 30

## 2023-02-21 MED ORDER — POLYETHYLENE GLYCOL 3350 17 G PO PACK: 17.0000 g | PACK | Freq: Every day | ORAL | Status: DC | PRN

## 2023-02-21 MED ORDER — AMISULPRIDE (ANTIEMETIC) 5 MG/2ML IV SOLN: 10.0000 mg | Freq: Once | INTRAVENOUS | Status: DC | PRN

## 2023-02-21 MED ORDER — FAMOTIDINE 20 MG PO TABS
20.0000 mg | ORAL_TABLET | Freq: Every day | ORAL | Status: DC
  Administered 2023-02-21 – 2023-02-22 (×2): 20 mg via ORAL
  Filled 2023-02-21 (×2): qty 1

## 2023-02-21 SURGICAL SUPPLY — 60 items
ADH SKN CLS APL DERMABOND .7 (GAUZE/BANDAGES/DRESSINGS) ×1
ADH SKN CLS LQ APL DERMABOND (GAUZE/BANDAGES/DRESSINGS) ×1
ADH SKNCLS APL OCTYL .7 VIOL (GAUZE/BANDAGES/DRESSINGS) ×1
ADH SKNCLS LQ APL MCBL BRR (GAUZE/BANDAGES/DRESSINGS) ×1
BAG COUNTER SPONGE SURGICOUNT (BAG) ×1 IMPLANT
BAG SPNG CNTER NS LX DISP (BAG) ×2
BIT DRILL ACP 13 (DRILL) IMPLANT
BIT DRILL ACP 15 (DRILL) IMPLANT
BIT DRILL NEURO 2X3.1 SFT TUCH (MISCELLANEOUS) ×1 IMPLANT
BNDG GAUZE DERMACEA FLUFF 4 (GAUZE/BANDAGES/DRESSINGS) IMPLANT
BNDG GZE DERMACEA 4 6PLY (GAUZE/BANDAGES/DRESSINGS)
BUR BARREL STRAIGHT FLUTE 4.0 (BURR) IMPLANT
CAGE CERV MOD 6X15X12 7D (Cage) IMPLANT
CANISTER SUCT 3000ML PPV (MISCELLANEOUS) ×1 IMPLANT
DERMABOND ADVANCED .7 DNX12 (GAUZE/BANDAGES/DRESSINGS) ×1 IMPLANT
DERMABOND ADVANCED .7 DNX6 (GAUZE/BANDAGES/DRESSINGS) IMPLANT
DRAPE LAPAROTOMY 100X72 PEDS (DRAPES) ×1 IMPLANT
DRAPE MICROSCOPE SLANT 54X150 (MISCELLANEOUS) IMPLANT
DRILL ACP 13 (DRILL) ×1
DRILL ACP 15 (DRILL) ×1
DRILL NEURO 2X3.1 SOFT TOUCH (MISCELLANEOUS) ×1
DURAPREP 6ML APPLICATOR 50/CS (WOUND CARE) ×1 IMPLANT
ELECT COATED BLADE 2.86 ST (ELECTRODE) ×1 IMPLANT
ELECT REM PT RETURN 9FT ADLT (ELECTROSURGICAL) ×1
ELECTRODE REM PT RTRN 9FT ADLT (ELECTROSURGICAL) ×1 IMPLANT
GAUZE 4X4 16PLY ~~LOC~~+RFID DBL (SPONGE) IMPLANT
GLOVE BIOGEL PI IND STRL 8.5 (GLOVE) ×1 IMPLANT
GLOVE ECLIPSE 8.5 STRL (GLOVE) ×1 IMPLANT
GLOVE EXAM NITRILE XL STR (GLOVE) IMPLANT
GOWN STRL REUS W/ TWL LRG LVL3 (GOWN DISPOSABLE) IMPLANT
GOWN STRL REUS W/ TWL XL LVL3 (GOWN DISPOSABLE) ×1 IMPLANT
GOWN STRL REUS W/TWL 2XL LVL3 (GOWN DISPOSABLE) ×1 IMPLANT
GOWN STRL REUS W/TWL LRG LVL3 (GOWN DISPOSABLE)
GOWN STRL REUS W/TWL XL LVL3 (GOWN DISPOSABLE) ×1
HALTER HD/CHIN CERV TRACTION D (MISCELLANEOUS) ×1 IMPLANT
HEMOSTAT POWDER KIT SURGIFOAM (HEMOSTASIS) ×1 IMPLANT
KIT BASIN OR (CUSTOM PROCEDURE TRAY) ×1 IMPLANT
KIT TURNOVER KIT B (KITS) ×1 IMPLANT
NDL HYPO 22X1.5 SAFETY MO (MISCELLANEOUS) ×1 IMPLANT
NDL SPNL 22GX3.5 QUINCKE BK (NEEDLE) ×1 IMPLANT
NEEDLE HYPO 22X1.5 SAFETY MO (MISCELLANEOUS) ×1 IMPLANT
NEEDLE SPNL 22GX3.5 QUINCKE BK (NEEDLE) ×1 IMPLANT
NS IRRIG 1000ML POUR BTL (IV SOLUTION) ×1 IMPLANT
PACK LAMINECTOMY NEURO (CUSTOM PROCEDURE TRAY) ×1 IMPLANT
PAD ARMBOARD 7.5X6 YLW CONV (MISCELLANEOUS) ×3 IMPLANT
PATTIES SURGICAL .5 X1 (DISPOSABLE) ×1 IMPLANT
PIN DISTRACTION 14MM (PIN) IMPLANT
PLATE ACP 1.9X54 3LVL (Plate) IMPLANT
PUTTY DBM PROPEL MEDIUM (Putty) IMPLANT
SCREW ACP ST VARI 3.5X13 (Screw) IMPLANT
SET WALTER ACTIVATION W/DRAPE (SET/KITS/TRAYS/PACK) ×1 IMPLANT
SOL ELECTROSURG ANTI STICK (MISCELLANEOUS) ×1
SOLUTION ELECTROSURG ANTI STCK (MISCELLANEOUS) ×1 IMPLANT
SPIKE FLUID TRANSFER (MISCELLANEOUS) ×1 IMPLANT
SPONGE INTESTINAL PEANUT (DISPOSABLE) ×1 IMPLANT
SUT VIC AB 4-0 RB1 18 (SUTURE) ×2 IMPLANT
TOWEL GREEN STERILE (TOWEL DISPOSABLE) ×1 IMPLANT
TOWEL GREEN STERILE FF (TOWEL DISPOSABLE) ×1 IMPLANT
TUBING FEATHERFLOW (TUBING) ×1 IMPLANT
WATER STERILE IRR 1000ML POUR (IV SOLUTION) ×1 IMPLANT

## 2023-02-21 NOTE — Anesthesia Postprocedure Evaluation (Signed)
Anesthesia Post Note  Patient: Sandra Day  Procedure(s) Performed: Cervical Three-Cervical Four, Cervical Four - Cervical Five, Cervical Five-Cervical Six  Anterior Cervical Discectomy Fusion     Patient location during evaluation: PACU Anesthesia Type: General Level of consciousness: awake and alert, patient cooperative and oriented Pain management: pain level controlled Vital Signs Assessment: post-procedure vital signs reviewed and stable Respiratory status: spontaneous breathing, nonlabored ventilation and respiratory function stable Cardiovascular status: blood pressure returned to baseline and stable Postop Assessment: no apparent nausea or vomiting Anesthetic complications: no   No notable events documented.  Last Vitals:  Vitals:   02/21/23 1915 02/21/23 1930  BP: (!) 145/67 (!) 141/62  Pulse: 81 82  Resp: 14 14  Temp:    SpO2: 95% 94%    Last Pain:  Vitals:   02/21/23 1930  TempSrc:   PainSc: Asleep                 Sandra Day,E. Anahla Bevis

## 2023-02-21 NOTE — Anesthesia Procedure Notes (Signed)
Procedure Name: Intubation Date/Time: 02/21/2023 3:20 PM  Performed by: Brynda Peon, CRNAPre-anesthesia Checklist: Patient identified, Emergency Drugs available, Suction available, Patient being monitored and Timeout performed Patient Re-evaluated:Patient Re-evaluated prior to induction Oxygen Delivery Method: Circle system utilized Preoxygenation: Pre-oxygenation with 100% oxygen Induction Type: IV induction Laryngoscope Size: Glidescope, 3 and Mac Grade View: Grade I Tube type: Oral Tube size: 7.0 mm Number of attempts: 1 Airway Equipment and Method: Stylet and Video-laryngoscopy Placement Confirmation: ETT inserted through vocal cords under direct vision, positive ETCO2 and breath sounds checked- equal and bilateral Secured at: 20 cm Tube secured with: Tape Dental Injury: Teeth and Oropharynx as per pre-operative assessment

## 2023-02-21 NOTE — H&P (Signed)
Sandra Day is an 77 y.o. female.   Chief Complaint: Neck and shoulder pain with tingling into the the upper extremities and weakness HPI: The patient is a 77 year old individual who has had significant upper extremity symptoms for over a years period of time.  I evaluated her a year ago and advised conservative management of this process when MRI demonstrates that she has a significant biforaminal stenosis with a kyphotic deformity across C3-4 C4-5 and C5-6 there is flattening of the cord particularly at C4-5.  There is no overt myelopathic symptoms however distress has been continuing despite efforts at conservative management and I ultimately advised anterior decompression arthrodesis at C3 445 and 5 6.  She is now admitted for this procedure.  Past Medical History:  Diagnosis Date   Anxiety    Arthritis of both knees 03/22/2017   Asthma    as a child, no issues in adulthood   Depression    Fatty liver    GERD (gastroesophageal reflux disease)    Hypertension    Multiple thyroid nodules    Osteoporosis 03/22/2017   Pancytopenia (HCC) 03/22/2017    Past Surgical History:  Procedure Laterality Date   KNEE SURGERY Left     Family History  Problem Relation Age of Onset   Breast cancer Sister        in hers 27s   Social History:  reports that she quit smoking about 44 years ago. Her smoking use included cigarettes. She has never used smokeless tobacco. She reports current alcohol use. She reports that she does not use drugs.  Allergies:  Allergies  Allergen Reactions   Sulfa Antibiotics Rash    All over body rash   Penicillins     Childhood reaction - Unknown reaction  Has patient had a PCN reaction causing immediate rash, facial/tongue/throat swelling, SOB or lightheadedness with hypotension: Unknown Has patient had a PCN reaction causing severe rash involving mucus membranes or skin necrosis: Unknown Has patient had a PCN reaction that required hospitalization:  Unknown Has patient had a PCN reaction occurring within the last 10 years: No If all of the above answers are "NO", then may proceed with Cephalosporin use.     No medications prior to admission.    No results found for this or any previous visit (from the past 48 hour(s)). No results found.  Review of Systems  Constitutional:  Positive for activity change.  Musculoskeletal:  Positive for myalgias, neck pain and neck stiffness.  All other systems reviewed and are negative.   There were no vitals taken for this visit. Physical Exam Constitutional:      Appearance: Normal appearance. She is normal weight.  HENT:     Head: Normocephalic and atraumatic.     Right Ear: Tympanic membrane, ear canal and external ear normal.     Left Ear: Tympanic membrane, ear canal and external ear normal.     Nose: Nose normal.     Mouth/Throat:     Mouth: Mucous membranes are dry.     Pharynx: Oropharynx is clear.  Eyes:     Extraocular Movements: Extraocular movements intact.     Conjunctiva/sclera: Conjunctivae normal.     Pupils: Pupils are equal, round, and reactive to light.  Neck:     Comments: Turning is intact to 60 degrees she has limitation of flexion extension to about 50% of normal.  Flexion actually aggravates symptoms into the shoulders and arms consistent with a positive Spurling. Cardiovascular:  Rate and Rhythm: Normal rate and regular rhythm.     Pulses: Normal pulses.     Heart sounds: Normal heart sounds.  Pulmonary:     Effort: Pulmonary effort is normal.     Breath sounds: Normal breath sounds.  Abdominal:     General: Abdomen is flat. Bowel sounds are normal.     Palpations: Abdomen is soft.  Musculoskeletal:        General: Normal range of motion.  Skin:    General: Skin is warm and dry.     Capillary Refill: Capillary refill takes less than 2 seconds.  Neurological:     General: No focal deficit present.     Mental Status: She is alert.  Psychiatric:         Mood and Affect: Mood normal.        Behavior: Behavior normal.        Thought Content: Thought content normal.        Judgment: Judgment normal.      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  Patient also has degenerative scoliosis throughout the thoracolumbar spine.  Plan: Anterior cervical decompression arthrodesis C3-4 C4-5 C5-6.  Stefani Dama, MD 02/21/2023, 7:38 AM

## 2023-02-21 NOTE — Anesthesia Preprocedure Evaluation (Addendum)
Anesthesia Evaluation  Patient identified by MRN, date of birth, ID band Patient awake    Reviewed: Allergy & Precautions, NPO status , Patient's Chart, lab work & pertinent test results  Airway Mallampati: II  TM Distance: >3 FB Neck ROM: Full    Dental no notable dental hx. (+) Dental Advisory Given   Pulmonary former smoker   Pulmonary exam normal        Cardiovascular hypertension, Pt. on medications Normal cardiovascular exam     Neuro/Psych  PSYCHIATRIC DISORDERS Anxiety Depression    negative neurological ROS     GI/Hepatic Neg liver ROS,GERD  Controlled and Medicated,,  Endo/Other  negative endocrine ROS    Renal/GU negative Renal ROS     Musculoskeletal negative musculoskeletal ROS (+)    Abdominal   Peds  Hematology negative hematology ROS (+)   Anesthesia Other Findings   Reproductive/Obstetrics                             Anesthesia Physical Anesthesia Plan  ASA: 2  Anesthesia Plan: General   Post-op Pain Management: Tylenol PO (pre-op)*   Induction: Intravenous  PONV Risk Score and Plan: 4 or greater and Ondansetron and Dexamethasone  Airway Management Planned: Oral ETT and Video Laryngoscope Planned  Additional Equipment:   Intra-op Plan:   Post-operative Plan: Extubation in OR  Informed Consent: I have reviewed the patients History and Physical, chart, labs and discussed the procedure including the risks, benefits and alternatives for the proposed anesthesia with the patient or authorized representative who has indicated his/her understanding and acceptance.     Dental advisory given  Plan Discussed with: Anesthesiologist and CRNA  Anesthesia Plan Comments:        Anesthesia Quick Evaluation

## 2023-02-21 NOTE — Interval H&P Note (Signed)
History and Physical Interval Note:  02/21/2023 12:22 PM  Sandra Day  has presented today for surgery, with the diagnosis of Disease of spinal cord.  The various methods of treatment have been discussed with the patient and family. After consideration of risks, benefits and other options for treatment, the patient has consented to  Procedure(s) with comments: C3-4, C4-5, C5-6 ACDF (N/A) - RM 21 3C as a surgical intervention.  The patient's history has been reviewed, patient examined, no change in status, stable for surgery.  I have reviewed the patient's chart and labs.  Questions were answered to the patient's satisfaction.     Stefani Dama

## 2023-02-21 NOTE — Transfer of Care (Signed)
Immediate Anesthesia Transfer of Care Note  Patient: Sandra Day  Procedure(s) Performed: Cervical Three-Cervical Four, Cervical Four - Cervical Five, Cervical Five-Cervical Six  Anterior Cervical Discectomy Fusion  Patient Location: PACU  Anesthesia Type:General  Level of Consciousness: drowsy  Airway & Oxygen Therapy: Patient Spontanous Breathing  Post-op Assessment: Report given to RN  Post vital signs: Reviewed and stable  Last Vitals:  Vitals Value Taken Time  BP 153/58 02/21/23 1809  Temp    Pulse 88 02/21/23 1810  Resp 26 02/21/23 1810  SpO2 95 % 02/21/23 1810  Vitals shown include unvalidated device data.  Last Pain:  Vitals:   02/21/23 1128  TempSrc:   PainSc: 0-No pain      Patients Stated Pain Goal: 0 (02/21/23 1128)  Complications: No notable events documented.

## 2023-02-21 NOTE — Op Note (Signed)
Date of surgery: 02/21/2023 Preoperative diagnosis: Cervical spondylosis with myelopathy C3-4 C4-5 C5-6.  Cervical radiculopathy. Postoperative diagnosis: Same Procedure: Anterior cervical decompression C3-4 C4-5 C5-6.  Arthrodesis with structural titanium implant and allograft C3-4 C4-5 C5-6.  Anterior plate fixation Z6-X0 with ACP 54 mm plate.  13 mm screws. Surgeon: Barnett Abu First Assistant: Julio Sicks, MD Anesthesia: General endotracheal Indications: Sandra Day is a 77 year old individual who has had significant neck shoulder and arm pain and now weakness in the upper extremities.  She has evidence of advanced spondylitic degeneration at C3-4 C4-5 C5-6 with a severe kyphosis formation in addition to cord flattening at the C5-6 and C4-5 levels.  She was advised regarding the need for surgical decompression and stabilization at C3 445 and 5 6.  Procedure: The patient was brought to the operating room supine on the stretcher.  After the smooth induction of general endotracheal anesthesia she was placed in 5 pounds of halter traction on the operating table and neck was prepped with alcohol DuraPrep and draped in a sterile fashion.  Transverse incision was made in the upper portion of the neck and carried down through the platysma the plane between the sternocleidomastoid and strap muscles was dissected bluntly into the prevertebral space was reached then the first identifiable disc space was noted to be that of C4-C5 using a Walther arm retractor I was able to expose the disc space with a Caspar type retractor in position.  Dr. Dutch Quint and I were able to work across from each other to quickly perform decompressions at C3-4 C4-5 and C5-6 removing the ventral osteophytes and entering the disc space decompressing the endplates down to the region of the posterior longitudinal ligament.  Ligament was opened bilaterally with me working from the left side and Dr. Dutch Quint working from the right side.   Self-retaining discredit was were used to facilitate the opening.  This was done with loupe magnification.  Once hemostasis and a good decompression was assured each interspace was sized and was felt that a 6 x 15 x 12 mm modulus spacer would fit best into the interval.  This had 7 degrees of lordosis and was filled with demineralized bone matrix.  C3-4 was done first followed by C4-5 and C5-6.  Once all the decompressions were performed and hemostasis was verified and the lateral gutters and along the longus coli muscle and anterior plate measuring 54 mm was fitted and secured with eight 3.5 x 13 mm screws.  These were locked in the position of final radiograph identified good position of the hardware.  With this hemostasis was verified and the ventral aspect of the spaces and once verified the retractors were removed the platysma was closed with 4-0 Vicryl in interrupted fashion and 4-0 Vicryl was used in the subcuticular skin Dermabond was used over this blood loss for the procedure was 150 cc.  Patient tolerated procedure well was returned to recovery room in stable condition.

## 2023-02-22 ENCOUNTER — Encounter (HOSPITAL_COMMUNITY): Payer: Self-pay | Admitting: Neurological Surgery

## 2023-02-22 MED ORDER — DEXAMETHASONE 1 MG PO TABS: ORAL_TABLET | ORAL | 0 refills | Status: DC

## 2023-02-22 MED ORDER — OXYCODONE-ACETAMINOPHEN 5-325 MG PO TABS: 1.0000 | ORAL_TABLET | ORAL | 0 refills | Status: DC | PRN

## 2023-02-22 MED ORDER — DEXAMETHASONE SODIUM PHOSPHATE 10 MG/ML IJ SOLN
10.0000 mg | Freq: Once | INTRAMUSCULAR | Status: AC
  Administered 2023-02-22: 10 mg via INTRAMUSCULAR
  Filled 2023-02-22: qty 1

## 2023-02-22 MED ORDER — METHOCARBAMOL 500 MG PO TABS: 500.0000 mg | ORAL_TABLET | Freq: Four times a day (QID) | ORAL | 2 refills | Status: DC | PRN

## 2023-02-22 NOTE — Evaluation (Addendum)
Occupational Therapy Evaluation Patient Details Name: Sandra Day MRN: [id number] DOB: 1946/05/23 Today's Date: 02/22/2023   History of Present Illness 77 yo female s/p 6/24 C3-C6  ACDF PMH anxiety arthritis bil knees, depression fatty liver GERD HTN osteoporosis pancytopenia L knee surgery   Clinical Impression   Patient is s/p ACDF C3-6 surgery resulting in functional limitations due to the deficits listed below (see OT problem list). Pt noted to have new onset of R UE weakness and ataxic movement with R UE. Pt provided HEP and demonstrates during session.Pt asked to BEFAST with R arm weakness noted. Pt without balance deficits except with vision occlusion. Patient will benefit from skilled OT acutely to increase independence and safety with ADLS to allow discharge outpatient OT.       Recommendations for follow up therapy are one component of a multi-disciplinary discharge planning process, led by the attending physician.  Recommendations may be updated based on patient status, additional functional criteria and insurance authorization.   Assistance Recommended at Discharge PRN  Patient can return home with the following Assist for transportation;Assistance with cooking/housework    Functional Status Assessment  Patient has had a recent decline in their functional status and demonstrates the ability to make significant improvements in function in a reasonable and predictable amount of time.  Equipment Recommendations       Recommendations for Other Services Other (comment) (outpatient OT)     Precautions / Restrictions Precautions Precautions: Cervical Precaution Comments: no brace      Mobility Bed Mobility Overal bed mobility: Modified Independent                  Transfers Overall transfer level: Modified independent                        Balance Overall balance assessment: Mild deficits observed, not formally tested                                          ADL either performed or assessed with clinical judgement   ADL Overall ADL's : Needs assistance/impaired Eating/Feeding: Set up   Grooming: Applying deodorant   Upper Body Bathing: Set up   Lower Body Bathing: Minimal assistance           Toilet Transfer: Min guard           Functional mobility during ADLs: Min guard General ADL Comments: pt with eyes occluded noted to sway with unsteady static standing. 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 pt with [facility] grasp and wrist. Pt reports new onset this morning around 6am. pt taught during session AAROM for shoulder flexion and elbow flexion. pt edcuated on scapula retraction and depression. pt  with good return demo for all exercises. pt provided HEP [employer] RUE Sensation: decreased light touch RUE Coordination: WNL   Lower Extremity Assessment Lower Extremity Assessment: Overall WFL for tasks assessed   Cervical / Trunk Assessment Cervical / Trunk Assessment: Neck Surgery   Communication Communication Communication: No difficulties   Cognition Arousal/Alertness: Awake/alert Behavior During Therapy: WFL for tasks assessed/performed Overall Cognitive Status: Within Functional Limits for tasks assessed                                        General Comments  incision dry and open to air. no drainage noted. pt does grimace with swallow    Exercises Exercises: Other exercises Other Exercises Other Exercises: HEP - demonstrate of exercises Other Exercises: added wall slides and walking up wall to advance pt while at home   Shoulder Instructions      Home Living Family/patient expects to be discharged to:: Private residence Living Arrangements: Alone Available Help at Discharge: Friend(s);Available 24 hours/day (until friday 6/28) Type of Home: House Home Access: Stairs to enter [employer] of Steps: 1 Entrance Stairs-Rails: Right;Left Home Layout: Two level;Bed/bath upstairs;Able to live on main level with bedroom/bathroom Alternate Level Stairs-Number of Steps: flight Alternate Level Stairs-Rails: Right;Left Bathroom Shower/Tub: Producer, television/film/video: Handicapped height     Home Equipment: None          Prior Functioning/Environment Prior Level of Function : Independent/Modified Independent;Driving                        OT Problem List: Decreased range of motion;Impaired balance (sitting and/or standing)      OT Treatment/Interventions: Self-care/ADL training;Therapeutic exercise;Neuromuscular education;Manual therapy;Modalities;Splinting;Balance training;Patient/family education    OT Goals(Current goals can be found in the care plan section) Acute Rehab OT Goals Patient Stated Goal: to get my R arm working OT Goal Formulation: With patient Time For Goal Achievement: 03/08/23 Potential to Achieve Goals: Good  OT Frequency: Min 2X/week    Co-evaluation              AM-PAC OT "6 Clicks" Daily Activity     Outcome Measure Help from another person eating meals?: A Little Help from another person taking care of personal grooming?: A Little Help from another person toileting, which includes using toliet, bedpan, or urinal?: A Little Help  from another person bathing (including washing, rinsing, drying)?: A Little Help from another person to put on and taking off regular upper body clothing?: A Little Help from another person to put on and taking off regular lower body clothing?: A Little 6 Click Score: 18   End of Session Equipment Utilized During Treatment: Gait belt Nurse Communication: Mobility status;Precautions  Activity Tolerance: Patient tolerated treatment well Patient left: in bed;with call bell/phone within reach;with family/visitor present  OT Visit Diagnosis: Unsteadiness on feet (R26.81);Muscle weakness (generalized) (M62.81)                Time: 6606-3016 OT Time Calculation (min): 21 min Charges:  OT General Charges $OT Visit: 1 Visit OT Evaluation $OT Eval Moderate Complexity: 1 Mod   Brynn, OTR/L  Acute Rehabilitation Services Office: [phone] .   Mateo Flow 02/22/2023, 12:04 PM

## 2023-02-22 NOTE — Progress Notes (Signed)
Patient alert and oriented, mae's well, voiding adequate amount of urine, swallowing without difficulty, no c/o pain at time of discharge. Patient discharged home with family. Script and discharged instructions given to patient. Patient and family stated understanding of instructions given. Patient has an appointment with Dr. Elsner  

## 2023-02-22 NOTE — Discharge Summary (Signed)
Physician Discharge Summary  Patient ID: Sandra Day MRN: [id number] DOB/AGE: Nov 29, 1945 77 y.o.  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 Principal Problem:   Cervical spondylosis with myelopathy   Discharged Condition: good  Hospital Course: Patient was admitted to undergo surgical decompression at C3-4 C4-5 C5-6.  She did well immediately postoperatively but the following day was noted that she had some mild weakness in the deltoid and bicep on the right side.  This been treated with some high-dose steroid.  She will be seen in follow-up in about 2 weeks time after undergoing further physical therapy and recuperation.  Consults: None  Significant Diagnostic Studies: None  Treatments: surgery: See op note  Discharge Exam: Blood pressure (!) 118/49, pulse 79, temperature 98.4 F (36.9 C), temperature source Oral, resp. rate 20, height 5\' 6"  (1.676 m), weight 55.8 kg, SpO2 95 %. Incision is clean and dry Station and gait are intact.  Weakness in the right deltoid and bicep rated at 3 out of 5.  Disposition: Discharge disposition: 01-Home or Self Care       Discharge Instructions     Diet - low sodium heart healthy   Complete by: As directed    Incentive spirometry RT   Complete by: As directed    Increase activity slowly   Complete by: As directed       Allergies as of 02/22/2023       Reactions   Sulfa Antibiotics Rash   All over body rash   Penicillins    Childhood reaction - Unknown reaction  Has patient had a PCN reaction causing immediate rash, facial/tongue/throat swelling, SOB or lightheadedness with hypotension: Unknown Has patient had a PCN reaction causing severe rash involving mucus membranes or skin necrosis: Unknown Has patient had a PCN reaction that  required hospitalization: Unknown Has patient had a PCN reaction occurring within the last 10 years: No If all of the above answers are "NO", then may proceed with Cephalosporin use.        Medication List     TAKE these medications    ACID REDUCER PO Take 1 tablet by mouth daily.   cetirizine 10 MG tablet Commonly known as: ZYRTEC Take 10 mg by mouth daily.   cyanocobalamin 1000 MCG tablet Commonly known as: VITAMIN B12 Take 1,000 mcg by mouth daily.   cycloSPORINE 0.05 % ophthalmic emulsion Commonly known as: RESTASIS Place 1 drop into both eyes 2 (two) times daily.   dexamethasone 1 MG tablet Commonly known as: DECADRON 2 tablets twice daily for 2 days, one tablet twice daily for 2 days, one tablet daily for 2 days.   Excedrin PM 38-500 MG Tabs Generic drug: diphenhydrAMINE-APAP (sleep) Take 1 tablet by mouth at bedtime as needed (pain/sleep).   Hair/Skin/Nails Tabs Take 2 tablets by mouth daily.   PRESERVISION AREDS 2 PO Take 1 capsule by mouth in the morning and at bedtime.   JUICE PLUS FIBRE PO Take 4 capsules by mouth daily. Takes 2 veggies and 2 fruits a day   losartan 50 MG tablet Commonly known as: COZAAR Take 50 mg by mouth daily.   methocarbamol 500 MG tablet Commonly known as: ROBAXIN Take 1 tablet (500 mg total) by mouth every 6 (six) hours as needed for muscle spasms.   oxyCODONE-acetaminophen 5-325 MG tablet Commonly known as: PERCOCET/ROXICET Take 1-2 tablets by  mouth every 4 (four) hours as needed for moderate pain or severe pain.   rosuvastatin 5 MG tablet Commonly known as: CRESTOR Take 5 mg by mouth every other day.   sertraline 50 MG tablet Commonly known as: ZOLOFT Take 50 mg by mouth daily.   Vitamin D3 125 MCG (5000 UT) Tabs Take 5,000 Units by mouth daily.   zinc gluconate 50 MG tablet Take 50 mg by mouth daily.         Signed: Shary Key Javione Gunawan 02/22/2023, 1:45 PM

## 2023-02-22 NOTE — Progress Notes (Signed)
PT Cancellation Note  Patient Details Name: Sandra Day MRN: [id number] DOB: January 29, 1946   Cancelled Treatment:    Reason Eval/Treat Not Completed: Other (comment). Pt with new onset of R UE weakness and waiting on orders from MD. PT to check with RN and OT tomorrow, 6/26 to see if PT is still needed.   Lewis Shock, PT, DPT Acute Rehabilitation Services Secure chat preferred Office #: [phone]    Iona Hansen 02/22/2023, 10:15 AM

## 2023-02-22 NOTE — Progress Notes (Signed)
Patient called staff about new onset weakness on the right upper arm towards the shoulder area. RN assessed patient and informed the neurosurgeon on the unit and new orders given and administered to the  patient. Will continue to monitor patient

## 2023-02-22 NOTE — Discharge Instructions (Signed)
Wound Care Leave incision open to air. You may shower. Do not scrub directly on incision.  Do not put any creams, lotions, or ointments on incision. Activity Walk each and every day, increasing distance each day. No lifting greater than 8 lbs.  Avoid excessive neck motion. No driving for 2 weeks; may ride as a passenger locally.  Diet Resume your normal diet.   Call Your Doctor If Any of These Occur Redness, drainage, or swelling at the wound.  Temperature greater than 101 degrees. Severe pain not relieved by pain medication. Increased difficulty swallowing. Incision starts to come apart. Follow Up Appt Call  [phone])  for problems.  If you have any hardware placed in your spine, you will need an x-ray before your appointment.  

## 2023-02-22 NOTE — Progress Notes (Signed)
Orthopedic Tech Progress Note Patient Details:  Sandra Day 1945/11/26 [id number] RN called requesting a SOFT COLLAR for patient. Applied with family at bedside    Patient ID: Sandra Day, female   DOB: Mar 18, 1946, 77 y.o.   MRN: [id number]  Donald Pore 02/22/2023, 2:10 PM

## 2023-02-22 NOTE — Progress Notes (Signed)
Patient ID: Sandra Day, female   DOB: [birth year], 77 y.o.   MRN: [id number] Patient is awake and alert swallowing well motor function reveals that there is weakness in the deltoid and the bicep on the right with slight decrease in grip on the right.  This suggest some modest irritability in the C5 distribution particularly.  Her motor strength otherwise is intact.  This did not seem to become apparent until this morning after patient was out on a walk.  She is not having any pain in this distribution.  Yesterday postoperatively her motor strength was good.  She was given a high dose of Decadron and will be sent home on a Decadron taper.  Process should resolve itself in time.

## 2023-03-11 ENCOUNTER — Other Ambulatory Visit (HOSPITAL_COMMUNITY): Payer: Self-pay | Admitting: Neurological Surgery

## 2023-03-11 DIAGNOSIS — G959 Disease of spinal cord, unspecified: Secondary | ICD-10-CM | POA: Diagnosis not present

## 2023-03-15 ENCOUNTER — Other Ambulatory Visit: Payer: Self-pay

## 2023-03-15 ENCOUNTER — Encounter: Payer: Self-pay | Admitting: Rehabilitative and Restorative Service Providers"

## 2023-03-15 ENCOUNTER — Ambulatory Visit: Payer: Medicare Other | Attending: Neurological Surgery | Admitting: Rehabilitative and Restorative Service Providers"

## 2023-03-15 DIAGNOSIS — R293 Abnormal posture: Secondary | ICD-10-CM | POA: Insufficient documentation

## 2023-03-15 DIAGNOSIS — R252 Cramp and spasm: Secondary | ICD-10-CM | POA: Diagnosis not present

## 2023-03-15 DIAGNOSIS — M6281 Muscle weakness (generalized): Secondary | ICD-10-CM | POA: Diagnosis not present

## 2023-03-15 NOTE — Therapy (Signed)
OUTPATIENT PHYSICAL THERAPY CERVICAL EVALUATION   Patient Name: Sandra Day MRN: [id number] DOB:09/02/1945, 77 y.o., female Today's Date: 03/15/2023  END OF SESSION:  PT End of Session - 03/15/23 1024     Visit Number 1    Date for PT Re-Evaluation 05/06/23    Authorization Type BC/BS Medicare    Progress Note Due on Visit 10    PT Start Time 1018    PT Stop Time 1055    PT Time Calculation (min) 37 min    Activity Tolerance Patient tolerated treatment well    Behavior During Therapy [facility] for tasks assessed/performed             Past Medical History:  Diagnosis Date   Anxiety    Arthritis of both knees 03/22/2017   Asthma    as a child, no issues in adulthood   Depression    Fatty liver    GERD (gastroesophageal reflux disease)    Hypertension    Multiple thyroid nodules    Osteoporosis 03/22/2017   Pancytopenia (HCC) 03/22/2017   Past Surgical History:  Procedure Laterality Date   ANTERIOR CERVICAL DECOMP/DISCECTOMY FUSION N/A 02/21/2023   Procedure: Cervical Three-Cervical Four, Cervical Four - Cervical Five, Cervical Five-Cervical Six  Anterior Cervical Discectomy Fusion;  Surgeon: Barnett Abu, MD;  Location: [facility] OR;  Service: Neurosurgery;  Laterality: N/A;  RM 21 3C   KNEE SURGERY Left    Patient Active Problem List   Diagnosis Date Noted   Cervical spondylosis with myelopathy 02/21/2023   Pancytopenia (HCC) 03/22/2017   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   Arthritis of both knees 03/22/2017   Osteoporosis 03/22/2017    PCP: Sigmund Hazel, MD  REFERRING PROVIDER: Barnett Abu, MD  REFERRING DIAG: G95.9 (ICD-10-CM) - Disease of spinal cord, unspecified  THERAPY DIAG:  Muscle weakness (generalized)  Cramp and spasm  Abnormal posture  Rationale for Evaluation and Treatment: Rehabilitation  ONSET DATE: Anterior cervical decompression C3-4 C4-5 C5-6 on 02/21/2023  SUBJECTIVE:                                                                                                                                                                                                          SUBJECTIVE STATEMENT: Pt underwent Anterior cervical decpression from C3-6 on 03/15/2023.  Patient states that the day after surgery, she started noticing that her right arm was not working properly.  She states that she is still having difficulty lifting it at this time, so she was referred to PT. Hand dominance: Right  PERTINENT HISTORY:  Anterior cervical decompression C3-4 C4-5 C5-6 on 02/21/2023  PAIN:  Are you having pain? Yes: NPRS scale: 0-2/10 Pain location: upper arm Pain description: constricting Aggravating factors: movement Relieving factors: rest  PRECAUTIONS: None  RED FLAGS: None     WEIGHT BEARING RESTRICTIONS: No  FALLS:  Has patient fallen in last 6 months? 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  NEXT MD VISIT: August 14th to see Dr Danielle Dess, but has a CT scan ordered next week  OBJECTIVE:   DIAGNOSTIC FINDINGS:  Cervical Radiograph on 02/21/2023: FINDINGS: Cross-table lateral images were obtained for surgical planning purposes. These demonstrate placement of a metallic marker overlying the W0-9 disc space. Second image demonstrates placement of an ACDF with intervertebral spacers at C3-6.  PATIENT SURVEYS:  Eval:  Quick Dash 40.91  COGNITION: Overall cognitive status: Within functional limits for tasks assessed  SENSATION: Reports minimal numbness and tingling in right hand, but has improved since before surgery  POSTURE: rounded shoulders  CERVICAL ROM:   Active ROM A/ROM  (deg) eval  Flexion 47  Extension 45  Right lateral flexion 32  Left lateral flexion 40  Right rotation 62  Left rotation 65   (Blank rows = not tested)  UPPER EXTREMITY ROM:  Active ROM Right eval Left eval  Shoulder flexion 35 160  Shoulder extension 65 80  Shoulder abduction 55 155  Shoulder adduction    Shoulder extension    Shoulder internal rotation    Shoulder external rotation    Elbow flexion    Elbow extension    Wrist flexion    Wrist extension    Wrist ulnar deviation    Wrist radial deviation    Wrist pronation    Wrist supination     (Blank rows = not tested)  UPPER EXTREMITY MMT:  MMT Right eval Left eval  Shoulder flexion 2 5  Shoulder extension 3 5  Shoulder abduction 2 5  Shoulder adduction    Shoulder extension    Shoulder internal rotation    Shoulder external rotation    Middle trapezius    Lower trapezius    Elbow flexion    Elbow extension    Wrist flexion    Wrist extension    Wrist ulnar deviation    Wrist radial deviation    Wrist pronation    Wrist supination    Grip strength 40 lbs 47 lbs   (Blank rows = not tested)   TODAY'S TREATMENT:  DATE: 03/15/2023   PATIENT EDUCATION:  Education details: Issued HEP Person educated: Patient Education method: Explanation, Demonstration, and Handouts Education comprehension: verbalized understanding and returned demonstration  HOME EXERCISE PROGRAM: Access Code: [email or web address] URL: [email or web address] Date: 03/15/2023 Prepared by: Clydie Braun Latamara Melder  Exercises - Seated Shoulder Flexion Towel Slide at Table Top  - 1 x daily - 7 x weekly - 2 sets - 10 reps - Seated Shoulder Abduction Towel Slide at Table Top  - 1 x daily - 7 x weekly - 2 sets - 10 reps - Shoulder Flexion Wall Slide with Towel  - 1 x daily - 7 x weekly - 2 sets - 10 reps - Supine  Shoulder Flexion Extension AAROM with Dowel  - 1 x daily - 7 x weekly - 2 sets - 10 reps  ASSESSMENT:  CLINICAL IMPRESSION: Patient is a 77 y.o. female who was seen today for physical therapy evaluation and treatment for s/p Anterior Cervical Decompression C3-4, C4-5, C5-6 on 02/21/2023. Patient reports difficulty holding a coffee cup or performing other functional tasks with her right arm.  Patient states that she has been trying to perform a wall finger walk, but it has been difficult.  Provided cuing with HEP of shoulder flexion wall slide to utilize left UE to allow for AA/ROM and patient was able to better perform.  Patient with right shoulder weakness and decreased right shoulder A/ROM.  Patient works for a [employer] and has been unable to safely walk the dogs secondary to UE weakness.  Patient would benefit from skilled PT to address her functional deficits to allow her to return to her independent and active lifestyle.  OBJECTIVE IMPAIRMENTS: decreased ROM, decreased strength, increased muscle spasms, impaired flexibility, postural dysfunction, and pain.   ACTIVITY LIMITATIONS: carrying, lifting, reach over head, and hygiene/grooming  PARTICIPATION LIMITATIONS: meal prep, cleaning, laundry, community activity, and occupation  PERSONAL FACTORS: Fitness and 1 comorbidity: s/p cervical ACDF on 02/21/2023  are also affecting patient's functional outcome.   REHAB POTENTIAL: Good  CLINICAL DECISION MAKING: Stable/uncomplicated  EVALUATION COMPLEXITY: Low   GOALS: Goals reviewed with patient? No  SHORT TERM GOALS: Target date: 04/01/2023  Pt will be independent with initial HEP. Baseline:  Goal status: INITIAL  2.  Pt will report at least 25% decrease in symptoms. Baseline:  Goal status: INITIAL   LONG TERM GOALS: Target date: 05/06/2023  Pt will be independent with initial HEP. Baseline:  Goal status: INITIAL  2.  Pt will increase Quick DASH to no greater than 25  to demonstrate her ability to complete functional tasks. Baseline: 40.91 Goal status: INITIAL  3.  Pt will increase right shoulder strength to at least 4/5 to allow her to lift objects with her right hand and use for ADLs/IADLs. Baseline: 2/5 Goal status: INITIAL  4.  Patient will increase her right shoulder A/ROM to at least 110 degrees flexion and abduction to allow her to reach into overhead cabinets. Baseline: see above Goal status: INITIAL  5.  Patient will increase right grip strength to greater than 50 pounds to allow her to grab and hold items in the home. Baseline: 40 lbs Goal status: INITIAL  6.  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 Baseline:  Goal status: INITIAL   PLAN:  PT FREQUENCY: 2x/week  PT DURATION: 8 weeks  PLANNED INTERVENTIONS: Therapeutic exercises, Therapeutic activity, Neuromuscular re-education, Balance training, Gait training, Patient/Family education, Self Care,  Joint mobilization, Joint manipulation, Aquatic Therapy, Dry Needling, Spinal manipulation, Spinal mobilization, Cryotherapy, Moist heat, scar mobilization, Taping, Traction, Ultrasound, Ionotophoresis 4mg /ml Dexamethasone, Manual therapy, and Re-evaluation  PLAN FOR NEXT SESSION: Assess and progress HEP as indicated, strengthening, postural stability   Reather Laurence, PT 03/15/2023, 11:20 AM  [facility] [address], [address] [place], [place] [address] Phone # [phone] Fax [phone]

## 2023-03-17 ENCOUNTER — Encounter: Payer: Self-pay | Admitting: Rehabilitative and Restorative Service Providers"

## 2023-03-17 ENCOUNTER — Ambulatory Visit: Payer: Medicare Other | Admitting: Rehabilitative and Restorative Service Providers"

## 2023-03-17 DIAGNOSIS — M6281 Muscle weakness (generalized): Secondary | ICD-10-CM

## 2023-03-17 DIAGNOSIS — R293 Abnormal posture: Secondary | ICD-10-CM

## 2023-03-17 DIAGNOSIS — R252 Cramp and spasm: Secondary | ICD-10-CM | POA: Diagnosis not present

## 2023-03-17 NOTE — Therapy (Signed)
OUTPATIENT PHYSICAL THERAPY CERVICAL EVALUATION   Patient Name: Sandra Day MRN: [id number] DOB:[birth year], 77 y.o., female Today's Date: 03/17/2023  END OF SESSION:  PT End of Session - 03/17/23 1318     Visit Number 2    Date for PT Re-Evaluation 05/06/23    Authorization Type BC/BS Medicare    Progress Note Due on Visit 10    PT Start Time 1315    PT Stop Time 1355    PT Time Calculation (min) 40 min    Activity Tolerance Patient tolerated treatment well    Behavior During Therapy [facility] for tasks assessed/performed             Past Medical History:  Diagnosis Date   Anxiety    Arthritis of both knees 03/22/2017   Asthma    as a child, no issues in adulthood   Depression    Fatty liver    GERD (gastroesophageal reflux disease)    Hypertension    Multiple thyroid nodules    Osteoporosis 03/22/2017   Pancytopenia (HCC) 03/22/2017   Past Surgical History:  Procedure Laterality Date   ANTERIOR CERVICAL DECOMP/DISCECTOMY FUSION N/A 02/21/2023   Procedure: Cervical Three-Cervical Four, Cervical Four - Cervical Five, Cervical Five-Cervical Six  Anterior Cervical Discectomy Fusion;  Surgeon: Barnett Abu, MD;  Location: [facility] OR;  Service: Neurosurgery;  Laterality: N/A;  RM 21 3C   KNEE SURGERY Left    Patient Active Problem List   Diagnosis Date Noted   Cervical spondylosis with myelopathy 02/21/2023   Pancytopenia (HCC) 03/22/2017   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   Arthritis of both knees 03/22/2017   Osteoporosis 03/22/2017    PCP: Sigmund Hazel, MD  REFERRING PROVIDER: Barnett Abu, MD  REFERRING DIAG: G95.9 (ICD-10-CM) - Disease of spinal cord, unspecified  THERAPY DIAG:  Muscle weakness (generalized)  Cramp and spasm  Abnormal posture  Rationale for Evaluation and Treatment: Rehabilitation  ONSET DATE: Anterior cervical decompression C3-4 C4-5 C5-6 on 02/21/2023  SUBJECTIVE:                                                                                                                                                                                                          SUBJECTIVE STATEMENT: Pt states that she feels that the exercises may be helping a little bit.  Hand dominance: Right  PERTINENT HISTORY:  Anterior cervical decompression C3-4 C4-5 C5-6 on 02/21/2023  PAIN:  Are you having pain? Yes: NPRS scale: 1/10 Pain location: upper arm Pain description: constricting Aggravating factors: movement Relieving factors: rest  PRECAUTIONS: None  RED FLAGS: None     WEIGHT BEARING RESTRICTIONS: No  FALLS:  Has patient fallen in last 6 months? 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  NEXT MD VISIT: August 14th to see Dr Danielle Dess, but has a CT scan ordered next week  OBJECTIVE:   DIAGNOSTIC FINDINGS:  Cervical Radiograph on 02/21/2023: FINDINGS: Cross-table lateral images were obtained for surgical planning purposes. These demonstrate placement of a metallic marker overlying the W0-9 disc space. Second image demonstrates placement of an ACDF with intervertebral spacers at C3-6.  PATIENT SURVEYS:  Eval:  Quick Dash 40.91  COGNITION: Overall cognitive status: Within functional limits for tasks assessed  SENSATION: Reports minimal numbness and tingling in right hand, but has improved since before surgery  POSTURE: rounded shoulders  CERVICAL ROM:   Active ROM A/ROM (deg) eval  Flexion 47  Extension 45  Right lateral flexion 32  Left lateral flexion 40  Right rotation 62  Left rotation 65   (Blank rows = not tested)  UPPER EXTREMITY ROM:  Active ROM Right eval  Left eval  Shoulder flexion 35 160  Shoulder extension 65 80  Shoulder abduction 55 155  Shoulder adduction    Shoulder extension    Shoulder internal rotation    Shoulder external rotation    Elbow flexion    Elbow extension    Wrist flexion    Wrist extension    Wrist ulnar deviation    Wrist radial deviation    Wrist pronation    Wrist supination     (Blank rows = not tested)  UPPER EXTREMITY MMT:  MMT Right eval Left eval  Shoulder flexion 2 5  Shoulder extension 3 5  Shoulder abduction 2 5  Shoulder adduction    Shoulder extension    Shoulder internal rotation    Shoulder external rotation    Middle trapezius    Lower trapezius    Elbow flexion    Elbow extension    Wrist flexion    Wrist extension    Wrist ulnar deviation    Wrist radial deviation    Wrist pronation    Wrist supination    Grip strength 40 lbs 47 lbs   (Blank rows = not tested)   TODAY'S TREATMENT:                                                                                                                               DATE: 03/17/2023  Shoulder pulley for flexion and abduction x2 min each UBE level 1.0 x2 min each direction with PT present to discuss  status Supine shoulder flexion AA/ROM with cane 2x10 Supine chest press with cane 2x10 Supine cervical retraction 2x10 Side-lying right shoulder ER 2x10 Standing with right hand on shoulder ranger performing CW and CCW rotations 2x10 each bilat Standing 4D scapular stabilization with green spiky ball x20 each Standing rows with red tband 2x10 Standing shoulder extension with red tband 2x10 Standing shoulder flexion wall wash with AA/ROM with occasional help from contralateral arm.  RUE x10    PATIENT EDUCATION:  Education details: Issued HEP Person educated: Patient Education method: Explanation, Demonstration, and Handouts Education comprehension: verbalized understanding and returned demonstration  HOME EXERCISE  PROGRAM: Access Code: [email or web address] URL: [email or web address] Date: 03/15/2023 Prepared by: Reather Laurence  Exercises - Seated Shoulder Flexion Towel Slide at Table Top  - 1 x daily - 7 x weekly - 2 sets - 10 reps - Seated Shoulder Abduction Towel Slide at Table Top  - 1 x daily - 7 x weekly - 2 sets - 10 reps - Shoulder Flexion Wall Slide with Towel  - 1 x daily - 7 x weekly - 2 sets - 10 reps - Supine Shoulder Flexion Extension AAROM with Dowel  - 1 x daily - 7 x weekly - 2 sets - 10 reps  ASSESSMENT:  CLINICAL IMPRESSION: Ms Barbato presents to skilled PT reporting compliance with HEP.  She was able to progress with shoulder ROM if in supported/assisted manner.  Patient with full range of motion noted with pulleys, secondary to having assistance of contralateral UE.  Patient requires cuing throughout to decrease right shoulder hiking/shrugging.  Patient continues to require skilled PT to progress towards goal related activities.   OBJECTIVE IMPAIRMENTS: decreased ROM, decreased strength, increased muscle spasms, impaired flexibility, postural dysfunction, and pain.   ACTIVITY LIMITATIONS: carrying, lifting, reach over head, and hygiene/grooming  PARTICIPATION LIMITATIONS: meal prep, cleaning, laundry, community activity, and occupation  PERSONAL FACTORS: Fitness and 1 comorbidity: s/p cervical ACDF on 02/21/2023  are also affecting patient's functional outcome.   REHAB POTENTIAL: Good  CLINICAL DECISION MAKING: Stable/uncomplicated  EVALUATION COMPLEXITY: Low   GOALS: Goals reviewed with patient? No  SHORT TERM GOALS: Target date: 04/01/2023  Pt will be independent with initial HEP. Baseline:  Goal status: ONGOING  2.  Pt will report at least 25% decrease in symptoms. Baseline:  Goal status: INITIAL   LONG TERM GOALS: Target date: 05/06/2023  Pt will be independent with initial HEP. Baseline:  Goal status: INITIAL  2.  Pt will increase Quick DASH to no  greater than 25 to demonstrate her ability to complete functional tasks. Baseline: 40.91 Goal status: INITIAL  3.  Pt will increase right shoulder strength to at least 4/5 to allow her to lift objects with her right hand and use for ADLs/IADLs. Baseline: 2/5 Goal status: INITIAL  4.  Patient will increase her right shoulder A/ROM to at least 110 degrees flexion and abduction to allow her to reach into overhead cabinets. Baseline: see above Goal status: INITIAL  5.  Patient will increase right grip strength to greater than 50 pounds to allow her to grab and hold items in the home. Baseline: 40 lbs Goal status: INITIAL  6.  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 Baseline:  Goal status: INITIAL   PLAN:  PT FREQUENCY: 2x/week  PT DURATION: 8 weeks  PLANNED INTERVENTIONS: Therapeutic exercises, Therapeutic activity, Neuromuscular re-education, Balance training, Gait training, Patient/Family education, Self Care, Joint  mobilization, Joint manipulation, Aquatic Therapy, Dry Needling, Spinal manipulation, Spinal mobilization, Cryotherapy, Moist heat, scar mobilization, Taping, Traction, Ultrasound, Ionotophoresis 4mg /ml Dexamethasone, Manual therapy, and Re-evaluation  PLAN FOR NEXT SESSION: Assess and progress HEP as indicated, strengthening, postural stability   Reather Laurence, PT 03/17/2023, 1:59 PM  [facility] [address], [address] [place], [place] [address] Phone # [phone] Fax [phone]

## 2023-03-23 ENCOUNTER — Ambulatory Visit: Payer: Medicare Other | Admitting: Rehabilitative and Restorative Service Providers"

## 2023-03-23 ENCOUNTER — Encounter: Payer: Self-pay | Admitting: Rehabilitative and Restorative Service Providers"

## 2023-03-23 DIAGNOSIS — R293 Abnormal posture: Secondary | ICD-10-CM

## 2023-03-23 DIAGNOSIS — M6281 Muscle weakness (generalized): Secondary | ICD-10-CM

## 2023-03-23 DIAGNOSIS — R252 Cramp and spasm: Secondary | ICD-10-CM

## 2023-03-23 NOTE — Therapy (Signed)
OUTPATIENT PHYSICAL THERAPY TREATMENT NOTE   Patient Name: Sandra Day MRN: [id number] DOB:[birth year], 77 y.o., female Today's Date: 03/23/2023  END OF SESSION:  PT End of Session - 03/23/23 1103     Visit Number 3    Date for PT Re-Evaluation 05/06/23    Authorization Type BC/BS Medicare    Progress Note Due on Visit 10    PT Start Time 1100    PT Stop Time 1140    PT Time Calculation (min) 40 min    Activity Tolerance Patient tolerated treatment well    Behavior During Therapy [facility] for tasks assessed/performed             Past Medical History:  Diagnosis Date   Anxiety    Arthritis of both knees 03/22/2017   Asthma    as a child, no issues in adulthood   Depression    Fatty liver    GERD (gastroesophageal reflux disease)    Hypertension    Multiple thyroid nodules    Osteoporosis 03/22/2017   Pancytopenia (HCC) 03/22/2017   Past Surgical History:  Procedure Laterality Date   ANTERIOR CERVICAL DECOMP/DISCECTOMY FUSION N/A 02/21/2023   Procedure: Cervical Three-Cervical Four, Cervical Four - Cervical Five, Cervical Five-Cervical Six  Anterior Cervical Discectomy Fusion;  Surgeon: Barnett Abu, MD;  Location: [facility] OR;  Service: Neurosurgery;  Laterality: N/A;  RM 21 3C   KNEE SURGERY Left    Patient Active Problem List   Diagnosis Date Noted   Cervical spondylosis with myelopathy 02/21/2023   Pancytopenia (HCC) 03/22/2017   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   Arthritis of both knees 03/22/2017   Osteoporosis 03/22/2017    PCP: Sigmund Hazel, MD  REFERRING PROVIDER: Barnett Abu, MD  REFERRING DIAG: G95.9 (ICD-10-CM) - Disease of spinal cord, unspecified  THERAPY DIAG:  Muscle weakness (generalized)  Cramp and spasm  Abnormal posture  Rationale for Evaluation and Treatment: Rehabilitation  ONSET DATE: Anterior cervical decompression C3-4 C4-5 C5-6 on 02/21/2023  SUBJECTIVE:                                                                                                                                                                                                          SUBJECTIVE STATEMENT: Pt reports compliance with HEP.  States that she has her cervical CT scan tomorrow afternoon.  Hand dominance: Right  PERTINENT HISTORY:  Anterior cervical decompression C3-4 C4-5 C5-6 on 02/21/2023  PAIN:  Are you having pain? Yes: NPRS scale: 2/10 Pain location: upper arm Pain description: constricting Aggravating factors: movement Relieving factors:  rest  PRECAUTIONS: None  RED FLAGS: None     WEIGHT BEARING RESTRICTIONS: No  FALLS:  Has patient fallen in last 6 months? 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  NEXT MD VISIT: August 14th to see Dr Danielle Dess, but has a CT scan ordered next week  OBJECTIVE:   DIAGNOSTIC FINDINGS:  Cervical Radiograph on 02/21/2023: FINDINGS: Cross-table lateral images were obtained for surgical planning purposes. These demonstrate placement of a metallic marker overlying the Z6-1 disc space. Second image demonstrates placement of an ACDF with intervertebral spacers at C3-6.  PATIENT SURVEYS:  Eval:  Quick Dash 40.91  COGNITION: Overall cognitive status: Within functional limits for tasks assessed  SENSATION: Reports minimal numbness and tingling in right hand, but has improved since before surgery  POSTURE: rounded shoulders  CERVICAL ROM:   Active ROM A/ROM (deg) eval  Flexion 47  Extension 45  Right lateral flexion 32  Left lateral flexion 40  Right rotation 62  Left rotation 65   (Blank rows = not tested)  UPPER EXTREMITY ROM:  Active  ROM Right eval Left eval  Shoulder flexion 35 160  Shoulder extension 65 80  Shoulder abduction 55 155  Shoulder adduction    Shoulder extension    Shoulder internal rotation    Shoulder external rotation    Elbow flexion    Elbow extension    Wrist flexion    Wrist extension    Wrist ulnar deviation    Wrist radial deviation    Wrist pronation    Wrist supination     (Blank rows = not tested)  UPPER EXTREMITY MMT:  MMT Right eval Left eval  Shoulder flexion 2 5  Shoulder extension 3 5  Shoulder abduction 2 5  Shoulder adduction    Shoulder extension    Shoulder internal rotation    Shoulder external rotation    Middle trapezius    Lower trapezius    Elbow flexion    Elbow extension    Wrist flexion    Wrist extension    Wrist ulnar deviation    Wrist radial deviation    Wrist pronation    Wrist supination    Grip strength 40 lbs 47 lbs   (Blank rows = not tested)   TODAY'S TREATMENT:                                                                                                                               DATE: 03/23/2023  Shoulder pulley for flexion and abduction x2 min each UBE level 1.0 x3 min each direction with PT present  to discuss status Supine shoulder flexion AA/ROM with cane 2x10 Supine right shoulder abduction AA/ROM with cane 2x10 Supine chest press with cane 2x10 Supine cervical retraction 2x10 Side-lying right shoulder ER 2x10 Standing with shoulder ranger on wall performing flexion and horizontal abduction/side-to-side 2x10 RUE Standing 4D scapular stabilization with green spiky ball x20 each Standing shoulder flexion wall wash with AA/ROM with occasional help from contralateral arm.  RUE 2x10   DATE: 03/17/2023  Shoulder pulley for flexion and abduction x2 min each UBE level 1.0 x2 min each direction with PT present to discuss status Supine shoulder flexion AA/ROM with cane 2x10 Supine chest press with cane 2x10 Supine cervical  retraction 2x10 Side-lying right shoulder ER 2x10 Standing with right hand on shoulder ranger performing CW and CCW rotations 2x10 each bilat Standing 4D scapular stabilization with green spiky ball x20 each Standing rows with red tband 2x10 Standing shoulder extension with red tband 2x10 Standing shoulder flexion wall wash with AA/ROM with occasional help from contralateral arm.  RUE x10    PATIENT EDUCATION:  Education details: Issued HEP Person educated: Patient Education method: Explanation, Demonstration, and Handouts Education comprehension: verbalized understanding and returned demonstration  HOME EXERCISE PROGRAM: Access Code: [email or web address] URL: [email or web address] Date: 03/15/2023 Prepared by: Reather Laurence  Exercises - Seated Shoulder Flexion Towel Slide at Table Top  - 1 x daily - 7 x weekly - 2 sets - 10 reps - Seated Shoulder Abduction Towel Slide at Table Top  - 1 x daily - 7 x weekly - 2 sets - 10 reps - Shoulder Flexion Wall Slide with Towel  - 1 x daily - 7 x weekly - 2 sets - 10 reps - Supine Shoulder Flexion Extension AAROM with Dowel  - 1 x daily - 7 x weekly - 2 sets - 10 reps  ASSESSMENT:  CLINICAL IMPRESSION: Ms Sandra Day presents to skilled PT reporting compliance with HEP and that she is going to go get her cervical CT scan tomorrow.  Patient able to progress with AA/ROM for right UE today.  Patient requires cuing throughout to decreased right upper trap activation during exercises.  Patient to have another session tomorrow and will continue to progress with ROM and strengthening.   OBJECTIVE IMPAIRMENTS: decreased ROM, decreased strength, increased muscle spasms, impaired flexibility, postural dysfunction, and pain.   ACTIVITY LIMITATIONS: carrying, lifting, reach over head, and hygiene/grooming  PARTICIPATION LIMITATIONS: meal prep, cleaning, laundry, community activity, and occupation  PERSONAL FACTORS: Fitness and 1 comorbidity: s/p  cervical ACDF on 02/21/2023  are also affecting patient's functional outcome.   REHAB POTENTIAL: Good  CLINICAL DECISION MAKING: Stable/uncomplicated  EVALUATION COMPLEXITY: Low   GOALS: Goals reviewed with patient? No  SHORT TERM GOALS: Target date: 04/01/2023  Pt will be independent with initial HEP. Baseline:  Goal status: ONGOING  2.  Pt will report at least 25% decrease in symptoms. Baseline:  Goal status: INITIAL   LONG TERM GOALS: Target date: 05/06/2023  Pt will be independent with initial HEP. Baseline:  Goal status: INITIAL  2.  Pt will increase Quick DASH to no greater than 25 to demonstrate her ability to complete functional tasks. Baseline: 40.91 Goal status: INITIAL  3.  Pt will increase right shoulder strength to at least 4/5 to allow her to lift objects with her right hand and use for ADLs/IADLs. Baseline: 2/5 Goal status: INITIAL  4.  Patient will increase her right shoulder A/ROM to at least 110 degrees flexion and abduction to allow her to reach  into overhead cabinets. Baseline: see above Goal status: INITIAL  5.  Patient will increase right grip strength to greater than 50 pounds to allow her to grab and hold items in the home. Baseline: 40 lbs Goal status: INITIAL  6.  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 Baseline:  Goal status: INITIAL   PLAN:  PT FREQUENCY: 2x/week  PT DURATION: 8 weeks  PLANNED INTERVENTIONS: Therapeutic exercises, Therapeutic activity, Neuromuscular re-education, Balance training, Gait training, Patient/Family education, Self Care, Joint mobilization, Joint manipulation, Aquatic Therapy, Dry Needling, Spinal manipulation, Spinal mobilization, Cryotherapy, Moist heat, scar mobilization, Taping, Traction, Ultrasound, Ionotophoresis 4mg /ml Dexamethasone, Manual therapy, and Re-evaluation  PLAN FOR NEXT SESSION: Assess and progress HEP as indicated,  strengthening, postural stability   Reather Laurence, PT 03/23/2023, 11:45 AM  [facility] [address] [place], [place] [address] Phone # [phone] Fax [phone]

## 2023-03-24 ENCOUNTER — Ambulatory Visit (HOSPITAL_COMMUNITY)
Admission: RE | Admit: 2023-03-24 | Discharge: 2023-03-24 | Disposition: A | Discharge status: Home / Self Care | Payer: Medicare Other | Source: Ambulatory Visit | Attending: Neurological Surgery | Admitting: Neurological Surgery

## 2023-03-24 ENCOUNTER — Ambulatory Visit: Payer: Medicare Other | Admitting: Rehabilitative and Restorative Service Providers"

## 2023-03-24 ENCOUNTER — Ambulatory Visit (HOSPITAL_COMMUNITY): Payer: Medicare Other

## 2023-03-24 ENCOUNTER — Encounter: Payer: Self-pay | Admitting: Rehabilitative and Restorative Service Providers"

## 2023-03-24 DIAGNOSIS — M5030 Other cervical disc degeneration, unspecified cervical region: Secondary | ICD-10-CM | POA: Diagnosis not present

## 2023-03-24 DIAGNOSIS — R293 Abnormal posture: Secondary | ICD-10-CM

## 2023-03-24 DIAGNOSIS — R252 Cramp and spasm: Secondary | ICD-10-CM | POA: Diagnosis not present

## 2023-03-24 DIAGNOSIS — M4802 Spinal stenosis, cervical region: Secondary | ICD-10-CM | POA: Diagnosis not present

## 2023-03-24 DIAGNOSIS — G959 Disease of spinal cord, unspecified: Secondary | ICD-10-CM | POA: Diagnosis not present

## 2023-03-24 DIAGNOSIS — E049 Nontoxic goiter, unspecified: Secondary | ICD-10-CM | POA: Diagnosis not present

## 2023-03-24 DIAGNOSIS — M6281 Muscle weakness (generalized): Secondary | ICD-10-CM | POA: Diagnosis not present

## 2023-03-24 DIAGNOSIS — Z4789 Encounter for other orthopedic aftercare: Secondary | ICD-10-CM | POA: Diagnosis not present

## 2023-03-24 NOTE — Therapy (Signed)
OUTPATIENT PHYSICAL THERAPY TREATMENT NOTE   Patient Name: Sandra Day MRN: [id number] DOB:April 24, 1946, 77 y.o., female Today's Date: 03/24/2023  END OF SESSION:  PT End of Session - 03/24/23 0929     Visit Number 4    Date for PT Re-Evaluation 05/06/23    Authorization Type BC/BS Medicare    Progress Note Due on Visit 10    PT Start Time 0927    PT Stop Time 1005    PT Time Calculation (min) 38 min    Activity Tolerance Patient tolerated treatment well    Behavior During Therapy [facility] for tasks assessed/performed             Past Medical History:  Diagnosis Date   Anxiety    Arthritis of both knees 03/22/2017   Asthma    as a child, no issues in adulthood   Depression    Fatty liver    GERD (gastroesophageal reflux disease)    Hypertension    Multiple thyroid nodules    Osteoporosis 03/22/2017   Pancytopenia (HCC) 03/22/2017   Past Surgical History:  Procedure Laterality Date   ANTERIOR CERVICAL DECOMP/DISCECTOMY FUSION N/A 02/21/2023   Procedure: Cervical Three-Cervical Four, Cervical Four - Cervical Five, Cervical Five-Cervical Six  Anterior Cervical Discectomy Fusion;  Surgeon: Barnett Abu, MD;  Location: [facility] OR;  Service: Neurosurgery;  Laterality: N/A;  RM 21 3C   KNEE SURGERY Left    Patient Active Problem List   Diagnosis Date Noted   Cervical spondylosis with myelopathy 02/21/2023   Pancytopenia (HCC) 03/22/2017   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   Arthritis of both knees 03/22/2017   Osteoporosis 03/22/2017    PCP: Sigmund Hazel, MD  REFERRING PROVIDER: Barnett Abu, MD  REFERRING DIAG: G95.9 (ICD-10-CM) - Disease of spinal cord, unspecified  THERAPY DIAG:  Muscle weakness (generalized)  Cramp and spasm  Abnormal posture  Rationale for Evaluation and Treatment: Rehabilitation  ONSET DATE: Anterior cervical decompression C3-4 C4-5 C5-6 on 02/21/2023  SUBJECTIVE:                                                                                                                                                                                                          SUBJECTIVE STATEMENT: Pt reports some soreness in her right shoulder from session yesterday.  Continues to report 2/10 pain.  Hand dominance: Right  PERTINENT HISTORY:  Anterior cervical decompression C3-4 C4-5 C5-6 on 02/21/2023  PAIN:  Are you having pain? Yes: NPRS scale: 2/10 Pain location: upper arm Pain description: constricting Aggravating factors: movement Relieving  factors: rest  PRECAUTIONS: None  RED FLAGS: None     WEIGHT BEARING RESTRICTIONS: No  FALLS:  Has patient fallen in last 6 months? 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  NEXT MD VISIT: August 14th to see Dr Danielle Dess, but has a CT scan ordered next week  OBJECTIVE:   DIAGNOSTIC FINDINGS:  Cervical Radiograph on 02/21/2023: FINDINGS: Cross-table lateral images were obtained for surgical planning purposes. These demonstrate placement of a metallic marker overlying the O9-6 disc space. Second image demonstrates placement of an ACDF with intervertebral spacers at C3-6.  PATIENT SURVEYS:  Eval:  Quick Dash 40.91  COGNITION: Overall cognitive status: Within functional limits for tasks assessed  SENSATION: Reports minimal numbness and tingling in right hand, but has improved since before surgery  POSTURE: rounded shoulders  CERVICAL ROM:   Active ROM A/ROM (deg) eval  Flexion 47  Extension 45  Right lateral flexion 32  Left lateral flexion 40  Right rotation 62  Left rotation 65   (Blank rows = not tested)  UPPER EXTREMITY  ROM:  Active ROM Right eval Left eval  Shoulder flexion 35 160  Shoulder extension 65 80  Shoulder abduction 55 155  Shoulder adduction    Shoulder extension    Shoulder internal rotation    Shoulder external rotation    Elbow flexion    Elbow extension    Wrist flexion    Wrist extension    Wrist ulnar deviation    Wrist radial deviation    Wrist pronation    Wrist supination     (Blank rows = not tested)  UPPER EXTREMITY MMT:  MMT Right eval Left eval  Shoulder flexion 2 5  Shoulder extension 3 5  Shoulder abduction 2 5  Shoulder adduction    Shoulder extension    Shoulder internal rotation    Shoulder external rotation    Middle trapezius    Lower trapezius    Elbow flexion    Elbow extension    Wrist flexion    Wrist extension    Wrist ulnar deviation    Wrist radial deviation    Wrist pronation    Wrist supination    Grip strength 40 lbs 47 lbs   (Blank rows = not tested)   TODAY'S TREATMENT:                                                                                                                               DATE: 03/24/2023  Shoulder pulley for flexion and abduction x2 min each UBE level 1.0 x3 min each direction with PT  present to discuss status Standing shoulder rows and shoulder extension with red tband 2x10 each bilat Standing shoulder ER and IR with red tband 2x10 each bilat Supine shoulder flexion AA/ROM with cane 2x10 Supine chest press with cane 2x10 Supine right shoulder abduction AA/ROM with cane 2x10 Standing shoulder flexion wall wash with AA/ROM with occasional help from contralateral arm.  RUE 2x10   DATE: 03/23/2023  Shoulder pulley for flexion and abduction x2 min each UBE level 1.0 x3 min each direction with PT present to discuss status Supine shoulder flexion AA/ROM with cane 2x10 Supine right shoulder abduction AA/ROM with cane 2x10 Supine chest press with cane 2x10 Supine cervical retraction 2x10 Side-lying right  shoulder ER 2x10 Standing with shoulder ranger on wall performing flexion and horizontal abduction/side-to-side 2x10 RUE Standing 4D scapular stabilization with green spiky ball x20 each Standing shoulder flexion wall wash with AA/ROM with occasional help from contralateral arm.  RUE 2x10   DATE: 03/17/2023  Shoulder pulley for flexion and abduction x2 min each UBE level 1.0 x2 min each direction with PT present to discuss status Supine shoulder flexion AA/ROM with cane 2x10 Supine chest press with cane 2x10 Supine cervical retraction 2x10 Side-lying right shoulder ER 2x10 Standing with right hand on shoulder ranger performing CW and CCW rotations 2x10 each bilat Standing 4D scapular stabilization with green spiky ball x20 each Standing rows with red tband 2x10 Standing shoulder extension with red tband 2x10 Standing shoulder flexion wall wash with AA/ROM with occasional help from contralateral arm.  RUE x10    PATIENT EDUCATION:  Education details: Issued HEP Person educated: Patient Education method: Explanation, Demonstration, and Handouts Education comprehension: verbalized understanding and returned demonstration  HOME EXERCISE PROGRAM: Access Code: [email or web address] URL: [email or web address] Date: 03/15/2023 Prepared by: Reather Laurence  Exercises - Seated Shoulder Flexion Towel Slide at Table Top  - 1 x daily - 7 x weekly - 2 sets - 10 reps - Seated Shoulder Abduction Towel Slide at Table Top  - 1 x daily - 7 x weekly - 2 sets - 10 reps - Shoulder Flexion Wall Slide with Towel  - 1 x daily - 7 x weekly - 2 sets - 10 reps - Supine Shoulder Flexion Extension AAROM with Dowel  - 1 x daily - 7 x weekly - 2 sets - 10 reps  ASSESSMENT:  CLINICAL IMPRESSION: Ms Parady presents to skilled PT reporting some soreness from session yesterday, but no other complaints.  Patient able to progress through session with minimal cuing for technique and pacing.  Patient did require  some cuing with right shoulder flexion wall wash exercise to control how much she uses the left arm to assist so that her right arm is still activating muscles to assist, pt able to correct this action.  Patient to have a cervical CT scan later today to rule out any further impairments.  Patient continues to require skilled PT to progress towards goal related activities.   OBJECTIVE IMPAIRMENTS: decreased ROM, decreased strength, increased muscle spasms, impaired flexibility, postural dysfunction, and pain.   ACTIVITY LIMITATIONS: carrying, lifting, reach over head, and hygiene/grooming  PARTICIPATION LIMITATIONS: meal prep, cleaning, laundry, community activity, and occupation  PERSONAL FACTORS: Fitness and 1 comorbidity: s/p cervical ACDF on 02/21/2023  are also affecting patient's functional outcome.   REHAB POTENTIAL: Good  CLINICAL DECISION MAKING: Stable/uncomplicated  EVALUATION COMPLEXITY: Low   GOALS: Goals reviewed with patient? No  SHORT TERM GOALS: Target date: 04/01/2023  Pt will be independent with initial  HEP. Baseline:  Goal status: ONGOING  2.  Pt will report at least 25% decrease in symptoms. Baseline:  Goal status: INITIAL   LONG TERM GOALS: Target date: 05/06/2023  Pt will be independent with initial HEP. Baseline:  Goal status: INITIAL  2.  Pt will increase Quick DASH to no greater than 25 to demonstrate her ability to complete functional tasks. Baseline: 40.91 Goal status: INITIAL  3.  Pt will increase right shoulder strength to at least 4/5 to allow her to lift objects with her right hand and use for ADLs/IADLs. Baseline: 2/5 Goal status: INITIAL  4.  Patient will increase her right shoulder A/ROM to at least 110 degrees flexion and abduction to allow her to reach into overhead cabinets. Baseline: see above Goal status: INITIAL  5.  Patient will increase right grip strength to greater than 50 pounds to allow her to grab and hold items in the  home. Baseline: 40 lbs Goal status: INITIAL  6.  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 Baseline:  Goal status: INITIAL   PLAN:  PT FREQUENCY: 2x/week  PT DURATION: 8 weeks  PLANNED INTERVENTIONS: Therapeutic exercises, Therapeutic activity, Neuromuscular re-education, Balance training, Gait training, Patient/Family education, Self Care, Joint mobilization, Joint manipulation, Aquatic Therapy, Dry Needling, Spinal manipulation, Spinal mobilization, Cryotherapy, Moist heat, scar mobilization, Taping, Traction, Ultrasound, Ionotophoresis 4mg /ml Dexamethasone, Manual therapy, and Re-evaluation  PLAN FOR NEXT SESSION: Assess and progress HEP as indicated, strengthening, postural stability   Reather Laurence, PT 03/24/2023, 10:19 AM  [facility] [address], [address] [place], [place] [address] Phone # [phone] Fax [phone]

## 2023-03-31 ENCOUNTER — Ambulatory Visit: Payer: Medicare Other | Attending: Neurological Surgery | Admitting: Rehabilitative and Restorative Service Providers"

## 2023-03-31 ENCOUNTER — Telehealth: Payer: Self-pay | Admitting: Rehabilitative and Restorative Service Providers"

## 2023-03-31 DIAGNOSIS — R293 Abnormal posture: Secondary | ICD-10-CM | POA: Insufficient documentation

## 2023-03-31 DIAGNOSIS — M6281 Muscle weakness (generalized): Secondary | ICD-10-CM | POA: Insufficient documentation

## 2023-03-31 DIAGNOSIS — R252 Cramp and spasm: Secondary | ICD-10-CM | POA: Insufficient documentation

## 2023-03-31 NOTE — Telephone Encounter (Signed)
Called patient secondary to missed PT appointment.  Left message and reminded of next scheduled appointment.

## 2023-04-04 NOTE — Therapy (Signed)
OUTPATIENT PHYSICAL THERAPY TREATMENT NOTE   Patient Name: Sandra Day MRN: [id number] DOB:04/01/1946, 77 y.o., female Today's Date: 04/05/2023  END OF SESSION:  PT End of Session - 04/05/23 1059     Visit Number 5    Date for PT Re-Evaluation 05/06/23    Authorization Type BC/BS Medicare    Progress Note Due on Visit 10    PT Start Time 1100    PT Stop Time 1144    PT Time Calculation (min) 44 min    Activity Tolerance Patient tolerated treatment well    Behavior During Therapy [facility] for tasks assessed/performed              Past Medical History:  Diagnosis Date   Anxiety    Arthritis of both knees 03/22/2017   Asthma    as a child, no issues in adulthood   Depression    Fatty liver    GERD (gastroesophageal reflux disease)    Hypertension    Multiple thyroid nodules    Osteoporosis 03/22/2017   Pancytopenia (HCC) 03/22/2017   Past Surgical History:  Procedure Laterality Date   ANTERIOR CERVICAL DECOMP/DISCECTOMY FUSION N/A 02/21/2023   Procedure: Cervical Three-Cervical Four, Cervical Four - Cervical Five, Cervical Five-Cervical Six  Anterior Cervical Discectomy Fusion;  Surgeon: Barnett Abu, MD;  Location: [facility] OR;  Service: Neurosurgery;  Laterality: N/A;  RM 21 3C   KNEE SURGERY Left    Patient Active Problem List   Diagnosis Date Noted   Cervical spondylosis with myelopathy 02/21/2023   Pancytopenia (HCC) 03/22/2017   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   Arthritis of both knees 03/22/2017   Osteoporosis 03/22/2017    PCP: Sigmund Hazel, MD  REFERRING PROVIDER: Barnett Abu, MD  REFERRING DIAG: G95.9 (ICD-10-CM) - Disease of spinal cord, unspecified  THERAPY DIAG:  Muscle weakness (generalized)  Cramp and spasm  Abnormal posture  Rationale for Evaluation and Treatment: Rehabilitation  ONSET DATE: Anterior cervical decompression C3-4 C4-5 C5-6 on 02/21/2023  SUBJECTIVE:                                                                                                                                                                                                          SUBJECTIVE STATEMENT: Has teleconference with MD today regarding CT.   Hand dominance: Right  PERTINENT HISTORY:  Anterior cervical decompression C3-4 C4-5 C5-6 on 02/21/2023  PAIN:  Are you having pain? Yes: NPRS scale: 2/10 Pain location: upper arm Pain description: constricting Aggravating factors: movement Relieving factors: rest  PRECAUTIONS: None  RED FLAGS:  None     WEIGHT BEARING RESTRICTIONS: No  FALLS:  Has patient fallen in last 6 months? 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  NEXT MD VISIT: August 14th to see Dr Danielle Dess, but has a CT scan ordered next week  OBJECTIVE:   DIAGNOSTIC FINDINGS:  Cervical Radiograph on 02/21/2023: FINDINGS: Cross-table lateral images were obtained for surgical planning purposes. These demonstrate placement of a metallic marker overlying the Z6-1 disc space. Second image demonstrates placement of an ACDF with intervertebral spacers at C3-6.  PATIENT SURVEYS:  Eval:  Quick Dash 40.91  COGNITION: Overall cognitive status: Within functional limits for tasks assessed  SENSATION: Reports minimal numbness and tingling in right hand, but has improved since before surgery  POSTURE: rounded shoulders  CERVICAL ROM:   Active ROM A/ROM (deg) eval  Flexion 47  Extension 45  Right lateral flexion 32  Left lateral flexion 40  Right rotation 62  Left rotation 65   (Blank rows = not tested)  UPPER EXTREMITY ROM:  Active ROM Right eval Left eval  Shoulder flexion  35 160  Shoulder extension 65 80  Shoulder abduction 55 155  Shoulder adduction    Shoulder extension    Shoulder internal rotation    Shoulder external rotation    Elbow flexion    Elbow extension    Wrist flexion    Wrist extension    Wrist ulnar deviation    Wrist radial deviation    Wrist pronation    Wrist supination     (Blank rows = not tested)  UPPER EXTREMITY MMT:  MMT Right eval Left eval  Shoulder flexion 2 5  Shoulder extension 3 5  Shoulder abduction 2 5  Shoulder adduction    Shoulder extension    Shoulder internal rotation    Shoulder external rotation    Middle trapezius    Lower trapezius    Elbow flexion    Elbow extension    Wrist flexion    Wrist extension    Wrist ulnar deviation    Wrist radial deviation    Wrist pronation    Wrist supination    Grip strength 40 lbs 47 lbs   (Blank rows = not tested)   TODAY'S TREATMENT:                                                                                                                               DATE: 04/05/2023  Shoulder pulley for flexion and abduction x2 min each UBE level 1.0 x3 min each direction with PT present to discuss status Doorway stretch 3 position  x 30 sec ea Child's pose to the left x 30 sec Standing shoulder rows blue tband and shoulder extension with green tband 2x10 each bilat Standing shoulder IR with red tband 1x10 R partial range, unable to do ER with resistance or isometric walk out without compensating with body Seated ER/IR B with 2# weights x 30 S/L ER R x 10 no wt Supine shoulder flexion 1.5# AA/ROM with cane 2x10 Supine chest press 1.5# with cane 2x10 Supine right shoulder abduction AA/ROM with slider  2x10  DATE: 03/24/2023  Shoulder pulley for flexion and abduction x2 min each UBE level 1.0 x3 min each direction with PT present to discuss status Standing shoulder rows and shoulder extension with red tband 2x10 each bilat Standing shoulder ER and IR with red  tband 2x10 each bilat Supine shoulder flexion AA/ROM with cane 2x10 Supine chest press with cane 2x10 Supine right shoulder abduction AA/ROM with cane 2x10 Standing shoulder flexion wall wash with AA/ROM with occasional help from contralateral arm.  RUE 2x10   DATE: 03/23/2023  Shoulder pulley for flexion and abduction x2 min each UBE level 1.0 x3 min each direction with PT present to discuss status Supine shoulder flexion AA/ROM with cane 2x10 Supine right shoulder abduction AA/ROM with cane 2x10 Supine chest press with cane 2x10 Supine cervical retraction 2x10 Side-lying right shoulder ER 2x10 Standing with shoulder ranger on wall performing flexion and horizontal abduction/side-to-side 2x10 RUE Standing 4D scapular stabilization with green spiky ball x20 each Standing shoulder flexion wall wash with AA/ROM with occasional help from contralateral arm.  RUE 2x10   DATE: 03/17/2023  Shoulder pulley for flexion and abduction x2 min each UBE level 1.0 x2 min each direction with PT present to discuss status Supine shoulder flexion AA/ROM with cane 2x10 Supine chest press with cane 2x10 Supine cervical retraction 2x10 Side-lying right shoulder ER 2x10 Standing with right hand on shoulder ranger performing CW and CCW rotations 2x10 each bilat Standing 4D scapular stabilization with green spiky ball x20 each Standing rows with red tband 2x10 Standing shoulder extension with red tband 2x10 Standing shoulder flexion wall wash with AA/ROM with occasional help from contralateral arm.  RUE x10    PATIENT EDUCATION:  Education details: Issued HEP Person educated: Patient Education method: Explanation, Demonstration, and Handouts Education comprehension: verbalized understanding and returned demonstration  HOME EXERCISE PROGRAM: Access Code: [email or web address] URL: [email or web address] Date: 03/15/2023 Prepared by: Clydie Braun Menke  Exercises - Seated Shoulder Flexion Towel Slide  at Table Top  - 1 x daily - 7 x weekly - 2 sets - 10 reps - Seated Shoulder Abduction Towel Slide at Table Top  - 1 x daily - 7 x weekly - 2 sets - 10 reps - Shoulder Flexion Wall Slide with Towel  - 1 x daily - 7 x weekly - 2 sets - 10 reps - Supine Shoulder Flexion Extension AAROM with Dowel  - 1 x daily - 7 x weekly - 2 sets - 10 reps  ASSESSMENT:  CLINICAL IMPRESSION: Jamora was not able to perform ER with resistance band today without compensation, so we changed to hand weights with good results. [facility] ER also difficult on R and she fatigues easily. Doorway stretches done today per patient request. She continues to demonstrate potential for improvement and would benefit from continued skilled therapy to address impairments.     OBJECTIVE IMPAIRMENTS: decreased ROM, decreased strength, increased muscle spasms, impaired flexibility, postural dysfunction, and pain.   ACTIVITY LIMITATIONS: carrying, lifting, reach over  head, and hygiene/grooming  PARTICIPATION LIMITATIONS: meal prep, cleaning, laundry, community activity, and occupation  PERSONAL FACTORS: Fitness and 1 comorbidity: s/p cervical ACDF on 02/21/2023  are also affecting patient's functional outcome.   REHAB POTENTIAL: Good  CLINICAL DECISION MAKING: Stable/uncomplicated  EVALUATION COMPLEXITY: Low   GOALS: Goals reviewed with patient? No  SHORT TERM GOALS: Target date: 04/01/2023  Pt will be independent with initial HEP. Baseline:  Goal status: ONGOING  2.  Pt will report at least 25% decrease in symptoms. Baseline:  Goal status: INITIAL   LONG TERM GOALS: Target date: 05/06/2023  Pt will be independent with initial HEP. Baseline:  Goal status: INITIAL  2.  Pt will increase Quick DASH to no greater than 25 to demonstrate her ability to complete functional tasks. Baseline: 40.91 Goal status: INITIAL  3.  Pt will increase right shoulder strength to at least 4/5 to allow her to lift objects with her right hand  and use for ADLs/IADLs. Baseline: 2/5 Goal status: INITIAL  4.  Patient will increase her right shoulder A/ROM to at least 110 degrees flexion and abduction to allow her to reach into overhead cabinets. Baseline: see above Goal status: INITIAL  5.  Patient will increase right grip strength to greater than 50 pounds to allow her to grab and hold items in the home. Baseline: 40 lbs Goal status: INITIAL  6.  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 Baseline:  Goal status: INITIAL   PLAN:  PT FREQUENCY: 2x/week  PT DURATION: 8 weeks  PLANNED INTERVENTIONS: Therapeutic exercises, Therapeutic activity, Neuromuscular re-education, Balance training, Gait training, Patient/Family education, Self Care, Joint mobilization, Joint manipulation, Aquatic Therapy, Dry Needling, Spinal manipulation, Spinal mobilization, Cryotherapy, Moist heat, scar mobilization, Taping, Traction, Ultrasound, Ionotophoresis 4mg /ml Dexamethasone, Manual therapy, and Re-evaluation  PLAN FOR NEXT SESSION: Assess and progress HEP as indicated, strengthening, postural stability   Jaliyah Munier PT 04/05/2023, 11:47 AM  [facility] [address] [place], [place] [address] Phone # [phone] Fax [phone]

## 2023-04-05 ENCOUNTER — Ambulatory Visit: Payer: Medicare Other | Admitting: Physical Therapy

## 2023-04-05 ENCOUNTER — Encounter: Payer: Self-pay | Admitting: Physical Therapy

## 2023-04-05 DIAGNOSIS — R252 Cramp and spasm: Secondary | ICD-10-CM | POA: Diagnosis not present

## 2023-04-05 DIAGNOSIS — R293 Abnormal posture: Secondary | ICD-10-CM | POA: Diagnosis not present

## 2023-04-05 DIAGNOSIS — M6281 Muscle weakness (generalized): Secondary | ICD-10-CM

## 2023-04-05 DIAGNOSIS — G959 Disease of spinal cord, unspecified: Secondary | ICD-10-CM | POA: Diagnosis not present

## 2023-04-07 ENCOUNTER — Ambulatory Visit: Payer: Medicare Other | Admitting: Rehabilitative and Restorative Service Providers"

## 2023-04-07 ENCOUNTER — Encounter: Payer: Self-pay | Admitting: Rehabilitative and Restorative Service Providers"

## 2023-04-07 DIAGNOSIS — M6281 Muscle weakness (generalized): Secondary | ICD-10-CM

## 2023-04-07 DIAGNOSIS — R293 Abnormal posture: Secondary | ICD-10-CM

## 2023-04-07 DIAGNOSIS — R252 Cramp and spasm: Secondary | ICD-10-CM

## 2023-04-07 NOTE — Therapy (Signed)
OUTPATIENT PHYSICAL THERAPY TREATMENT NOTE   Patient Name: Sandra Day MRN: [id number] DOB:[birth year], 77 y.o., female Today's Date: 04/07/2023  END OF SESSION:  PT End of Session - 04/07/23 1043     Visit Number 6    Date for PT Re-Evaluation 05/06/23    Authorization Type BC/BS Medicare    Progress Note Due on Visit 10    PT Start Time 1040    PT Stop Time 1135    PT Time Calculation (min) 55 min    Activity Tolerance Patient tolerated treatment well    Behavior During Therapy [facility] for tasks assessed/performed              Past Medical History:  Diagnosis Date   Anxiety    Arthritis of both knees 03/22/2017   Asthma    as a child, no issues in adulthood   Depression    Fatty liver    GERD (gastroesophageal reflux disease)    Hypertension    Multiple thyroid nodules    Osteoporosis 03/22/2017   Pancytopenia (HCC) 03/22/2017   Past Surgical History:  Procedure Laterality Date   ANTERIOR CERVICAL DECOMP/DISCECTOMY FUSION N/A 02/21/2023   Procedure: Cervical Three-Cervical Four, Cervical Four - Cervical Five, Cervical Five-Cervical Six  Anterior Cervical Discectomy Fusion;  Surgeon: Barnett Abu, MD;  Location: [facility] OR;  Service: Neurosurgery;  Laterality: N/A;  RM 21 3C   KNEE SURGERY Left    Patient Active Problem List   Diagnosis Date Noted   Cervical spondylosis with myelopathy 02/21/2023   Pancytopenia (HCC) 03/22/2017   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   Arthritis of both knees 03/22/2017   Osteoporosis 03/22/2017    PCP: Sigmund Hazel, MD  REFERRING PROVIDER: Barnett Abu, MD  REFERRING DIAG: G95.9 (ICD-10-CM) - Disease of spinal cord, unspecified  THERAPY DIAG:  Muscle weakness (generalized)  Cramp and spasm  Abnormal posture  Rationale for Evaluation and Treatment: Rehabilitation  ONSET DATE: Anterior cervical decompression C3-4 C4-5 C5-6 on 02/21/2023  SUBJECTIVE:                                                                                                                                                                                                          SUBJECTIVE STATEMENT: Pt reports that she has maybe had 25% improvement since starting PT.  Denies any pain today  Hand dominance: Right  PERTINENT HISTORY:  Anterior cervical decompression C3-4 C4-5 C5-6 on 02/21/2023  PAIN:  Are you having pain? Yes: NPRS scale: 0/10 Pain location: upper arm Pain description: constricting Aggravating factors: movement  Relieving factors: rest  PRECAUTIONS: None  RED FLAGS: None     WEIGHT BEARING RESTRICTIONS: No  FALLS:  Has patient fallen in last 6 months? 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  NEXT MD VISIT: August 14th to see Dr Danielle Dess  OBJECTIVE:   DIAGNOSTIC FINDINGS:  Cervical Radiograph on 02/21/2023: FINDINGS: Cross-table lateral images were obtained for surgical planning purposes. These demonstrate placement of a metallic marker overlying the Z6-1 disc space. Second image demonstrates placement of an ACDF with intervertebral spacers at C3-6.  Cervical CT Scan on 03/24/2023: IMPRESSION: 1. Uncomplicated C3-C6 ACDF with bridging bone seen through each cage. 2. No interval adjacent segment degeneration when compared to preoperative MRI. 3. Heterogeneous enlargement the left lobe thyroid. Recommend thyroid ultrasound (ref: J Am Coll Radiol. 2015 Feb;12(2): 143-50).  PATIENT SURVEYS:  Eval:  Quick Dash 40.91  COGNITION: Overall cognitive status: Within functional limits for tasks assessed  SENSATION: Reports minimal numbness and tingling in right hand, but has  improved since before surgery  POSTURE: rounded shoulders  CERVICAL ROM:   Active ROM A/ROM (deg) eval  Flexion 47  Extension 45  Right lateral flexion 32  Left lateral flexion 40  Right rotation 62  Left rotation 65   (Blank rows = not tested)  UPPER EXTREMITY ROM:  Active ROM Right eval Right 04/07/23 Left eval  Shoulder flexion 35 57 160  Shoulder extension 65 65 80  Shoulder abduction 55 60 155  Shoulder adduction     Shoulder internal rotation     Shoulder external rotation      (Blank rows = not tested)  UPPER EXTREMITY MMT:  MMT Right eval Left eval  Shoulder flexion 2 5  Shoulder extension 3 5  Shoulder abduction 2 5  Shoulder adduction    Shoulder extension    Shoulder internal rotation    Shoulder external rotation    Middle trapezius    Lower trapezius    Elbow flexion    Elbow extension    Wrist flexion    Wrist extension    Wrist ulnar deviation    Wrist radial deviation    Wrist pronation    Wrist supination    Grip strength 40 lbs 47 lbs   (Blank rows = not tested)   TODAY'S TREATMENT:                                                                                                                               DATE: 04/07/2023  UBE level 1.0 x3 min each direction with PT present to discuss status Shoulder pulley for flexion  and abduction x2 min each (with cuing to try to encourage use of RUE during pulleys Standing wall wash for shoulder flexion and abduction 2x10 right UE Doorway stretch 3 position x 30 sec ea Supine shoulder flexion 2# AA/ROM with cane 2x10 Supine chest press 2# with cane 2x10 Supine right shoulder abduction AA/ROM with cane 2x10 Supine right UE chest press x10 Left side-lying for right shoulder ER 2x10 Barre push ups 2x10 (with cuing for improved body mechanics and technique) Standing shoulder rows with green tband 2x10 Standing shoulder extension with green tband 2x10 Bent over shoulder flexion 2x10   DATE:  04/05/2023  Shoulder pulley for flexion and abduction x2 min each UBE level 1.0 x3 min each direction with PT present to discuss status Doorway stretch 3 position x 30 sec ea Child's pose to the left x 30 sec Standing shoulder rows blue tband and shoulder extension with green tband 2x10 each bilat Standing shoulder IR with red tband 1x10 R partial range, unable to do ER with resistance or isometric walk out without compensating with body Seated ER/IR B with 2# weights x 30 S/L ER R x 10 no wt Supine shoulder flexion 1.5# AA/ROM with cane 2x10 Supine chest press 1.5# with cane 2x10 Supine right shoulder abduction AA/ROM with slider  2x10  DATE: 03/24/2023  Shoulder pulley for flexion and abduction x2 min each UBE level 1.0 x3 min each direction with PT present to discuss status Standing shoulder rows and shoulder extension with red tband 2x10 each bilat Standing shoulder ER and IR with red tband 2x10 each bilat Supine shoulder flexion AA/ROM with cane 2x10 Supine chest press with cane 2x10 Supine right shoulder abduction AA/ROM with cane 2x10 Standing shoulder flexion wall wash with AA/ROM with occasional help from contralateral arm.  RUE 2x10    PATIENT EDUCATION:  Education details: Issued HEP Person educated: Patient Education method: Explanation, Demonstration, and Handouts Education comprehension: verbalized understanding and returned demonstration  HOME EXERCISE PROGRAM: Access Code: [email or web address] URL: [email or web address] Date: 04/07/2023 Prepared by: Reather Laurence  Exercises - Seated Shoulder Flexion Towel Slide at Table Top  - 1 x daily - 7 x weekly - 2 sets - 10 reps - Seated Shoulder Abduction Towel Slide at Table Top  - 1 x daily - 7 x weekly - 2 sets - 10 reps - Shoulder Flexion Wall Slide with Towel  - 1 x daily - 7 x weekly - 2 sets - 10 reps - Supine Shoulder Flexion Extension AAROM with Dowel  - 1 x daily - 7 x weekly - 2 sets - 10 reps - Supine Chest  Press with Dumbbells  - 1 x daily - 7 x weekly - 2 sets - 10 reps - Supine Shoulder Press AAROM in Abduction with Dowel  - 1 x daily - 7 x weekly - 2 sets - 10 reps - Sidelying Shoulder External Rotation  - 1 x daily - 7 x weekly - 2 sets - 10 reps - Standing Shoulder Row with Anchored Resistance  - 1 x daily - 7 x weekly - 2 sets - 10 reps - Shoulder extension with resistance - Neutral  - 1 x daily - 7 x weekly - 2 sets - 10 reps  ASSESSMENT:  CLINICAL IMPRESSION: Ms Asquith presents to skilled PT being slightly discouraged, afraid that she is not improving but only 25% thus far.  Able to remeasure shoulder A/ROM today and noted improvements in both flexion and abduction today compared to initial evaluation.  Patient able to progress with strengthening and continues to progress with AA/ROM.  Patient provided with updated HEP during session today.  Patient to follow up with surgeon next week.   OBJECTIVE IMPAIRMENTS: decreased ROM, decreased strength, increased muscle spasms, impaired flexibility, postural dysfunction, and pain.   ACTIVITY LIMITATIONS: carrying, lifting, reach over head, and hygiene/grooming  PARTICIPATION LIMITATIONS: meal prep, cleaning, laundry, community activity, and occupation  PERSONAL FACTORS: Fitness and 1 comorbidity: s/p cervical ACDF on 02/21/2023  are also affecting patient's functional outcome.   REHAB POTENTIAL: Good  CLINICAL DECISION MAKING: Stable/uncomplicated  EVALUATION COMPLEXITY: Low   GOALS: Goals reviewed with patient? No  SHORT TERM GOALS: Target date: 04/01/2023  Pt will be independent with initial HEP. Baseline:  Goal status: MET  2.  Pt will report at least 25% decrease in symptoms. Baseline:  Goal status: MET   LONG TERM GOALS: Target date: 05/06/2023  Pt will be independent with initial HEP. Baseline:  Goal status: Ongoing  2.  Pt will increase Quick DASH to no greater than 25 to demonstrate her ability to complete functional  tasks. Baseline: 40.91 Goal status: INITIAL  3.  Pt will increase right shoulder strength to at least 4/5 to allow her to lift objects with her right hand and use for ADLs/IADLs. Baseline: 2/5 Goal status: INITIAL  4.  Patient will increase her right shoulder A/ROM to at least 110 degrees flexion and abduction to allow her to reach into overhead cabinets. Baseline: see above Goal status: INITIAL  5.  Patient will increase right grip strength to greater than 50 pounds to allow her to grab and hold items in the home. Baseline: 40 lbs Goal status: INITIAL  6.  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 Baseline:  Goal status: INITIAL   PLAN:  PT FREQUENCY: 2x/week  PT DURATION: 8 weeks  PLANNED INTERVENTIONS: Therapeutic exercises, Therapeutic activity, Neuromuscular re-education, Balance training, Gait training, Patient/Family education, Self Care, Joint mobilization, Joint manipulation, Aquatic Therapy, Dry Needling, Spinal manipulation, Spinal mobilization, Cryotherapy, Moist heat, scar mobilization, Taping, Traction, Ultrasound, Ionotophoresis 4mg /ml Dexamethasone, Manual therapy, and Re-evaluation  PLAN FOR NEXT SESSION: Assess and progress HEP as indicated, strengthening, postural stability   Reather Laurence, PT, DPT 04/07/23, 11:47 AM  [facility] [address] [place], [place] [address] Phone # [phone] Fax [phone]

## 2023-04-12 ENCOUNTER — Ambulatory Visit: Payer: Medicare Other | Admitting: Rehabilitative and Restorative Service Providers"

## 2023-04-12 ENCOUNTER — Encounter: Payer: Self-pay | Admitting: Rehabilitative and Restorative Service Providers"

## 2023-04-12 DIAGNOSIS — R252 Cramp and spasm: Secondary | ICD-10-CM

## 2023-04-12 DIAGNOSIS — R293 Abnormal posture: Secondary | ICD-10-CM | POA: Diagnosis not present

## 2023-04-12 DIAGNOSIS — M6281 Muscle weakness (generalized): Secondary | ICD-10-CM
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
## 2023-04-14 ENCOUNTER — Ambulatory Visit: Payer: Medicare Other | Admitting: Rehabilitative and Restorative Service Providers"

## 2023-04-14 ENCOUNTER — Encounter: Payer: Self-pay | Admitting: Rehabilitative and Restorative Service Providers"

## 2023-04-14 DIAGNOSIS — R293 Abnormal posture: Secondary | ICD-10-CM

## 2023-04-14 DIAGNOSIS — M6281 Muscle weakness (generalized): Secondary | ICD-10-CM | POA: Diagnosis not present

## 2023-04-14 DIAGNOSIS — R252 Cramp and spasm: Secondary | ICD-10-CM | POA: Diagnosis not present

## 2023-04-14 NOTE — Therapy (Signed)
OUTPATIENT PHYSICAL THERAPY TREATMENT NOTE   Patient Name: Sandra Day MRN: [id number] DOB:1946/03/05, 77 y.o., female Today's Date: 04/14/2023  END OF SESSION:  PT End of Session - 04/14/23 1239     Visit Number 8    Date for PT Re-Evaluation 05/06/23    Authorization Type BC/BS Medicare    Progress Note Due on Visit 10    PT Start Time 1230    PT Stop Time 1310    PT Time Calculation (min) 40 min    Activity Tolerance Patient tolerated treatment well    Behavior During Therapy [facility] for tasks assessed/performed              Past Medical History:  Diagnosis Date   Anxiety    Arthritis of both knees 03/22/2017   Asthma    as a child, no issues in adulthood   Depression    Fatty liver    GERD (gastroesophageal reflux disease)    Hypertension    Multiple thyroid nodules    Osteoporosis 03/22/2017   Pancytopenia (HCC) 03/22/2017   Past Surgical History:  Procedure Laterality Date   ANTERIOR CERVICAL DECOMP/DISCECTOMY FUSION N/A 02/21/2023   Procedure: Cervical Three-Cervical Four, Cervical Four - Cervical Five, Cervical Five-Cervical Six  Anterior Cervical Discectomy Fusion;  Surgeon: Barnett Abu, MD;  Location: [facility] OR;  Service: Neurosurgery;  Laterality: N/A;  RM 21 3C   KNEE SURGERY Left    Patient Active Problem List   Diagnosis Date Noted   Cervical spondylosis with myelopathy 02/21/2023   Pancytopenia (HCC) 03/22/2017   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   Arthritis of both knees 03/22/2017   Osteoporosis 03/22/2017    PCP: Sigmund Hazel, MD  REFERRING PROVIDER: Barnett Abu, MD  REFERRING DIAG: G95.9 (ICD-10-CM) - Disease of spinal cord, unspecified  THERAPY DIAG:  Muscle weakness (generalized)  Cramp and spasm  Abnormal posture  Rationale for Evaluation and Treatment: Rehabilitation  ONSET DATE: Anterior cervical decompression C3-4 C4-5 C5-6 on 02/21/2023  SUBJECTIVE:                                                                                                                                                                                                          SUBJECTIVE STATEMENT: Pt states that she had a good follow up with Dr Danielle Dess and he is pleased with her progress.  He advised her that it could take a year for her to regain function.  Hand dominance: Right  PERTINENT HISTORY:  Anterior cervical decompression C3-4 C4-5 C5-6 on 02/21/2023  PAIN:  Are  you having pain? Yes: NPRS scale: 2/10 Pain location: upper arm Pain description: constricting Aggravating factors: movement Relieving factors: rest  PRECAUTIONS: None  RED FLAGS: None     WEIGHT BEARING RESTRICTIONS: No  FALLS:  Has patient fallen in last 6 months? 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  NEXT MD VISIT: 2 months with Dr Danielle Dess  OBJECTIVE:   DIAGNOSTIC FINDINGS:  Cervical Radiograph on 02/21/2023: FINDINGS: Cross-table lateral images were obtained for surgical planning purposes. These demonstrate placement of a metallic marker overlying the B2-8 disc space. Second image demonstrates placement of an ACDF with intervertebral spacers at C3-6.  Cervical CT Scan on 03/24/2023: IMPRESSION: 1. Uncomplicated C3-C6 ACDF with bridging bone seen through each cage. 2. No interval adjacent segment degeneration when compared to preoperative MRI. 3. Heterogeneous enlargement the left lobe thyroid. Recommend thyroid ultrasound (ref: J Am Coll Radiol. 2015 Feb;12(2): 143-50).  PATIENT SURVEYS:  Eval:  Quick Dash 40.91  COGNITION: Overall cognitive status: Within functional limits for tasks  assessed  SENSATION: Reports minimal numbness and tingling in right hand, but has improved since before surgery  POSTURE: rounded shoulders  CERVICAL ROM:   Active ROM A/ROM (deg) eval  Flexion 47  Extension 45  Right lateral flexion 32  Left lateral flexion 40  Right rotation 62  Left rotation 65   (Blank rows = not tested)  UPPER EXTREMITY ROM:  Active ROM Right eval Right 04/07/23 Left eval  Shoulder flexion 35 57 160  Shoulder extension 65 65 80  Shoulder abduction 55 60 155  Shoulder adduction     Shoulder internal rotation     Shoulder external rotation      (Blank rows = not tested)  UPPER EXTREMITY MMT:  MMT Right eval Left eval  Shoulder flexion 2 5  Shoulder extension 3 5  Shoulder abduction 2 5  Shoulder adduction    Shoulder extension    Shoulder internal rotation    Shoulder external rotation    Middle trapezius    Lower trapezius    Elbow flexion    Elbow extension    Wrist flexion    Wrist extension    Wrist ulnar deviation    Wrist radial deviation    Wrist pronation    Wrist supination    Grip strength 40 lbs 47 lbs   (Blank rows = not tested)   TODAY'S TREATMENT:                                                                                                                               DATE: 04/12/2023  Nustep  level 5 x6 min with PT present to discuss status Prone right shoulder:  flexion, horizontal abduction, extension.  2x10 each Prone shoulder row with 2# 2x10 RUE Supine shoulder flexion 2# AA/ROM with cane 2x10 Supine chest press 2# with cane 2x10 Supine right shoulder abduction AA/ROM with cane 2x10 Supine serratus punch 2x10 Supine finger tracing of alphabet with arm in 90 deg flexion A-Z Standing shoulder rows with green tband 2x10 Standing shoulder extension with green tband 2x10 Standing shoulder finger ladder x10   DATE: 04/12/2023  Nustep level 5 x6 min with PT present to discuss status Prone right shoulder:   flexion, horizontal abduction, extension.  2x10 each Prone shoulder row with 2# 2x10 RUE Supine shoulder flexion 2# AA/ROM with cane 2x10 Supine chest press 2# with cane 2x10 Supine right shoulder abduction AA/ROM with cane 2x10 Left side-lying for right shoulder ER 2x10 (with rolled up towel under arm for improved posture) Supine serratus punch 2x10 Supine finger tracing of alphabet with arm in 90 deg flexion A-Z Seated posterior shoulder rolls 2x10 Standing shoulder rows with green tband 2x10 Standing shoulder extension with green tband 2x10 Standing shoulder finger ladder x10   DATE: 04/07/2023  UBE level 1.0 x3 min each direction with PT present to discuss status Shoulder pulley for flexion and abduction x2 min each (with cuing to try to encourage use of RUE during pulleys Standing wall wash for shoulder flexion and abduction 2x10 right UE Doorway stretch 3 position x 30 sec ea Supine shoulder flexion 2# AA/ROM with cane 2x10 Supine chest press 2# with cane 2x10 Supine right shoulder abduction AA/ROM with cane 2x10 Supine right UE chest press x10 Left side-lying for right shoulder ER 2x10 Barre push ups 2x10 (with cuing for improved body mechanics and technique) Standing shoulder rows with green tband 2x10 Standing shoulder extension with green tband 2x10 Bent over shoulder flexion 2x10    PATIENT EDUCATION:  Education details: Issued HEP Person educated: Patient Education method: Explanation, Facilities manager, and Handouts Education comprehension: verbalized understanding and returned demonstration  HOME EXERCISE PROGRAM: Access Code: [email or web address] URL: [email or web address] Date: 04/07/2023 Prepared by: Reather Laurence  Exercises - Seated Shoulder Flexion Towel Slide at Table Top  - 1 x daily - 7 x weekly - 2 sets - 10 reps - Seated Shoulder Abduction Towel Slide at Table Top  - 1 x daily - 7 x weekly - 2 sets - 10 reps - Shoulder Flexion Wall Slide with Towel  -  1 x daily - 7 x weekly - 2 sets - 10 reps - Supine Shoulder Flexion Extension AAROM with Dowel  - 1 x daily - 7 x weekly - 2 sets - 10 reps - Supine Chest Press with Dumbbells  - 1 x daily - 7 x weekly - 2 sets - 10 reps - Supine Shoulder Press AAROM in Abduction with Dowel  - 1 x daily - 7 x weekly - 2 sets - 10 reps - Sidelying Shoulder External Rotation  - 1 x daily - 7 x weekly - 2 sets - 10 reps - Standing Shoulder Row with Anchored Resistance  - 1 x daily - 7 x weekly - 2 sets - 10 reps - Shoulder extension with resistance - Neutral  - 1 x daily - 7 x weekly - 2 sets - 10 reps  ASSESSMENT:  CLINICAL IMPRESSION: Ms Howd presents to skilled PT reporting that she is starting to see slight improvements.  Patient able to progress in session and able to  performing increased range of motion in positions with gravity minimized.  With use of finger ladder, patient able to perform AA/ROM without assistance from contralateral limb.  Patient continues to progress towards goal related activities and improved mobility of right shoulder.  Patient is progressing overall with improved strength and improved functional mobility.   OBJECTIVE IMPAIRMENTS: decreased ROM, decreased strength, increased muscle spasms, impaired flexibility, postural dysfunction, and pain.   ACTIVITY LIMITATIONS: carrying, lifting, reach over head, and hygiene/grooming  PARTICIPATION LIMITATIONS: meal prep, cleaning, laundry, community activity, and occupation  PERSONAL FACTORS: Fitness and 1 comorbidity: s/p cervical ACDF on 02/21/2023  are also affecting patient's functional outcome.   REHAB POTENTIAL: Good  CLINICAL DECISION MAKING: Stable/uncomplicated  EVALUATION COMPLEXITY: Low   GOALS: Goals reviewed with patient? No  SHORT TERM GOALS: Target date: 04/01/2023  Pt will be independent with initial HEP. Baseline:  Goal status: MET  2.  Pt will report at least 25% decrease in symptoms. Baseline:  Goal status:  MET   LONG TERM GOALS: Target date: 05/06/2023  Pt will be independent with initial HEP. Baseline:  Goal status: Ongoing  2.  Pt will increase Quick DASH to no greater than 25 to demonstrate her ability to complete functional tasks. Baseline: 40.91 Goal status: INITIAL  3.  Pt will increase right shoulder strength to at least 4/5 to allow her to lift objects with her right hand and use for ADLs/IADLs. Baseline: 2/5 Goal status: INITIAL  4.  Patient will increase her right shoulder A/ROM to at least 110 degrees flexion and abduction to allow her to reach into overhead cabinets. Baseline: see above Goal status: INITIAL  5.  Patient will increase right grip strength to greater than 50 pounds to allow her to grab and hold items in the home. Baseline: 40 lbs Goal status: INITIAL  6.  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 Baseline:  Goal status: Ongoing   PLAN:  PT FREQUENCY: 2x/week  PT DURATION: 8 weeks  PLANNED INTERVENTIONS: Therapeutic exercises, Therapeutic activity, Neuromuscular re-education, Balance training, Gait training, Patient/Family education, Self Care, Joint mobilization, Joint manipulation, Aquatic Therapy, Dry Needling, Spinal manipulation, Spinal mobilization, Cryotherapy, Moist heat, scar mobilization, Taping, Traction, Ultrasound, Ionotophoresis 4mg /ml Dexamethasone, Manual therapy, and Re-evaluation  PLAN FOR NEXT SESSION: Assess and progress HEP as indicated, strengthening, postural stability   Reather Laurence, PT, DPT 04/14/23, 1:19 PM  [facility] [address], [address] [place], [place] [address] Phone # [phone] Fax [phone]

## 2023-04-15 DIAGNOSIS — H5212 Myopia, left eye: Secondary | ICD-10-CM | POA: Diagnosis not present

## 2023-04-19 ENCOUNTER — Encounter: Payer: Self-pay | Admitting: Rehabilitative and Restorative Service Providers"

## 2023-04-19 ENCOUNTER — Ambulatory Visit: Payer: Medicare Other | Admitting: Rehabilitative and Restorative Service Providers"

## 2023-04-19 DIAGNOSIS — R293 Abnormal posture: Secondary | ICD-10-CM

## 2023-04-19 DIAGNOSIS — R252 Cramp and spasm: Secondary | ICD-10-CM

## 2023-04-19 DIAGNOSIS — M6281 Muscle weakness (generalized): Secondary | ICD-10-CM

## 2023-04-19 NOTE — Therapy (Signed)
OUTPATIENT PHYSICAL THERAPY TREATMENT NOTE   Patient Name: Sandra Day MRN: [id number] DOB:06-29-1946, 77 y.o., female Today's Date: 04/19/2023  END OF SESSION:  PT End of Session - 04/19/23 1113     Visit Number 9    Date for PT Re-Evaluation 05/06/23    Authorization Type BC/BS Medicare    Progress Note Due on Visit 10    PT Start Time 1102    PT Stop Time 1140    PT Time Calculation (min) 38 min    Activity Tolerance Patient tolerated treatment well    Behavior During Therapy [facility] for tasks assessed/performed              Past Medical History:  Diagnosis Date   Anxiety    Arthritis of both knees 03/22/2017   Asthma    as a child, no issues in adulthood   Depression    Fatty liver    GERD (gastroesophageal reflux disease)    Hypertension    Multiple thyroid nodules    Osteoporosis 03/22/2017   Pancytopenia (HCC) 03/22/2017   Past Surgical History:  Procedure Laterality Date   ANTERIOR CERVICAL DECOMP/DISCECTOMY FUSION N/A 02/21/2023   Procedure: Cervical Three-Cervical Four, Cervical Four - Cervical Five, Cervical Five-Cervical Six  Anterior Cervical Discectomy Fusion;  Surgeon: Barnett Abu, MD;  Location: [facility] OR;  Service: Neurosurgery;  Laterality: N/A;  RM 21 3C   KNEE SURGERY Left    Patient Active Problem List   Diagnosis Date Noted   Cervical spondylosis with myelopathy 02/21/2023   Pancytopenia (HCC) 03/22/2017   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   Arthritis of both knees 03/22/2017   Osteoporosis 03/22/2017    PCP: Sigmund Hazel, MD  REFERRING PROVIDER: Barnett Abu, MD  REFERRING DIAG: G95.9 (ICD-10-CM) - Disease of spinal cord, unspecified  THERAPY DIAG:  Muscle weakness (generalized)  Cramp and spasm  Abnormal posture  Rationale for Evaluation and Treatment: Rehabilitation  ONSET DATE: Anterior cervical decompression C3-4 C4-5 C5-6 on 02/21/2023  SUBJECTIVE:                                                                                                                                                                                                          SUBJECTIVE STATEMENT: Pt reports having a little more pain today, but feels it may be due to nerves healing.  Hand dominance: Right  PERTINENT HISTORY:  Anterior cervical decompression C3-4 C4-5 C5-6 on 02/21/2023  PAIN:  Are you having pain? Yes: NPRS scale: 4/10 Pain location: upper arm Pain description: constricting Aggravating factors: movement  Relieving factors: rest  PRECAUTIONS: None  RED FLAGS: None     WEIGHT BEARING RESTRICTIONS: No  FALLS:  Has patient fallen in last 6 months? 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  NEXT MD VISIT: 2 months with Dr Danielle Dess  OBJECTIVE:   DIAGNOSTIC FINDINGS:  Cervical Radiograph on 02/21/2023: FINDINGS: Cross-table lateral images were obtained for surgical planning purposes. These demonstrate placement of a metallic marker overlying the Z6-1 disc space. Second image demonstrates placement of an ACDF with intervertebral spacers at C3-6.  Cervical CT Scan on 03/24/2023: IMPRESSION: 1. Uncomplicated C3-C6 ACDF with bridging bone seen through each cage. 2. No interval adjacent segment degeneration when compared to preoperative MRI. 3. Heterogeneous enlargement the left lobe thyroid. Recommend thyroid ultrasound (ref: J Am Coll Radiol. 2015 Feb;12(2): 143-50).  PATIENT SURVEYS:  Eval:  Quick Dash 40.91  COGNITION: Overall cognitive status: Within functional limits for tasks assessed  SENSATION: Reports minimal numbness and tingling in right hand, but has improved  since before surgery  POSTURE: rounded shoulders  CERVICAL ROM:   Active ROM A/ROM (deg) eval  Flexion 47  Extension 45  Right lateral flexion 32  Left lateral flexion 40  Right rotation 62  Left rotation 65   (Blank rows = not tested)  UPPER EXTREMITY ROM:  Active ROM Right eval Right 04/07/23 Right 04/19/23 Left eval  Shoulder flexion 35 57 60 160  Shoulder extension 65 65 65 80  Shoulder abduction 55 60 85 155  Shoulder adduction      Shoulder internal rotation      Shoulder external rotation       (Blank rows = not tested)  UPPER EXTREMITY MMT:  MMT Right eval Left eval  Shoulder flexion 2 5  Shoulder extension 3 5  Shoulder abduction 2 5  Shoulder adduction    Shoulder extension    Shoulder internal rotation    Shoulder external rotation    Middle trapezius    Lower trapezius    Elbow flexion    Elbow extension    Wrist flexion    Wrist extension    Wrist ulnar deviation    Wrist radial deviation    Wrist pronation    Wrist supination    Grip strength 40 lbs 47 lbs   (Blank rows = not tested)   TODAY'S TREATMENT:                                                                                                                               DATE: 04/19/2023  UBE level 1.0 x3 min each direction with PT present  to discuss status Standing UE Ranger for right shoulder flexion 2x10 Right shoulder A/ROM in standing Standing shoulder finger ladder for right shoulder flexion  x10 (to 31) Bilateral small yellow physioball up wall x10 Supine cervical retraction 2x10 Supine shoulder press into mat 2x10 Supine shoulder flexion 2# AA/ROM with cane 2x10 Supine chest press 2# with cane 2x10 Supine serratus punch with 1# 2x10 Supine finger tracing of alphabet with arm in 90 deg flexion A-Z   DATE: 04/14/2023  Nustep level 5 x6 min with PT present to discuss status Prone right shoulder:  flexion, horizontal abduction, extension.  2x10 each Prone shoulder row  with 2# 2x10 RUE Supine shoulder flexion 2# AA/ROM with cane 2x10 Supine chest press 2# with cane 2x10 Supine right shoulder abduction AA/ROM with cane 2x10 Supine serratus punch 2x10 Supine finger tracing of alphabet with arm in 90 deg flexion A-Z Standing shoulder rows with green tband 2x10 Standing shoulder extension with green tband 2x10 Standing shoulder finger ladder x10   DATE: 04/12/2023  Nustep level 5 x6 min with PT present to discuss status Prone right shoulder:  flexion, horizontal abduction, extension.  2x10 each Prone shoulder row with 2# 2x10 RUE Supine shoulder flexion 2# AA/ROM with cane 2x10 Supine chest press 2# with cane 2x10 Supine right shoulder abduction AA/ROM with cane 2x10 Left side-lying for right shoulder ER 2x10 (with rolled up towel under arm for improved posture) Supine serratus punch 2x10 Supine finger tracing of alphabet with arm in 90 deg flexion A-Z Seated posterior shoulder rolls 2x10 Standing shoulder rows with green tband 2x10 Standing shoulder extension with green tband 2x10 Standing shoulder finger ladder x10     PATIENT EDUCATION:  Education details: Issued HEP Person educated: Patient Education method: Explanation, Facilities manager, and Handouts Education comprehension: verbalized understanding and returned demonstration  HOME EXERCISE PROGRAM: Access Code: [email or web address] URL: [email or web address] Date: 04/07/2023 Prepared by: Reather Laurence  Exercises - Seated Shoulder Flexion Towel Slide at Table Top  - 1 x daily - 7 x weekly - 2 sets - 10 reps - Seated Shoulder Abduction Towel Slide at Table Top  - 1 x daily - 7 x weekly - 2 sets - 10 reps - Shoulder Flexion Wall Slide with Towel  - 1 x daily - 7 x weekly - 2 sets - 10 reps - Supine Shoulder Flexion Extension AAROM with Dowel  - 1 x daily - 7 x weekly - 2 sets - 10 reps - Supine Chest Press with Dumbbells  - 1 x daily - 7 x weekly - 2 sets - 10 reps - Supine Shoulder  Press AAROM in Abduction with Dowel  - 1 x daily - 7 x weekly - 2 sets - 10 reps - Sidelying Shoulder External Rotation  - 1 x daily - 7 x weekly - 2 sets - 10 reps - Standing Shoulder Row with Anchored Resistance  - 1 x daily - 7 x weekly - 2 sets - 10 reps - Shoulder extension with resistance - Neutral  - 1 x daily - 7 x weekly - 2 sets - 10 reps  ASSESSMENT:  CLINICAL IMPRESSION: Sandra Day presents to skilled PT reporting that she is noticing some increased tingling and pain and hopes that this is due to her nerves healing from surgery.  Patient with noted increased right shoulder A/ROM today and able to progress with weight in serratus punch.  Patient continues to have good AA/ROM with exercises with cane in supine.  Patient continues to require  skilled PT to progress towards goal related activities.   OBJECTIVE IMPAIRMENTS: decreased ROM, decreased strength, increased muscle spasms, impaired flexibility, postural dysfunction, and pain.   ACTIVITY LIMITATIONS: carrying, lifting, reach over head, and hygiene/grooming  PARTICIPATION LIMITATIONS: meal prep, cleaning, laundry, community activity, and occupation  PERSONAL FACTORS: Fitness and 1 comorbidity: s/p cervical ACDF on 02/21/2023  are also affecting patient's functional outcome.   REHAB POTENTIAL: Good  CLINICAL DECISION MAKING: Stable/uncomplicated  EVALUATION COMPLEXITY: Low   GOALS: Goals reviewed with patient? No  SHORT TERM GOALS: Target date: 04/01/2023  Pt will be independent with initial HEP. Baseline:  Goal status: MET  2.  Pt will report at least 25% decrease in symptoms. Baseline:  Goal status: MET   LONG TERM GOALS: Target date: 05/06/2023  Pt will be independent with initial HEP. Baseline:  Goal status: Ongoing  2.  Pt will increase Quick DASH to no greater than 25 to demonstrate her ability to complete functional tasks. Baseline: 40.91 Goal status: INITIAL  3.  Pt will increase right shoulder  strength to at least 4/5 to allow her to lift objects with her right hand and use for ADLs/IADLs. Baseline: 2/5 Goal status: INITIAL  4.  Patient will increase her right shoulder A/ROM to at least 110 degrees flexion and abduction to allow her to reach into overhead cabinets. Baseline: see above Goal status: INITIAL  5.  Patient will increase right grip strength to greater than 50 pounds to allow her to grab and hold items in the home. Baseline: 40 lbs Goal status: INITIAL  6.  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 Baseline:  Goal status: Ongoing   PLAN:  PT FREQUENCY: 2x/week  PT DURATION: 8 weeks  PLANNED INTERVENTIONS: Therapeutic exercises, Therapeutic activity, Neuromuscular re-education, Balance training, Gait training, Patient/Family education, Self Care, Joint mobilization, Joint manipulation, Aquatic Therapy, Dry Needling, Spinal manipulation, Spinal mobilization, Cryotherapy, Moist heat, scar mobilization, Taping, Traction, Ultrasound, Ionotophoresis 4mg /ml Dexamethasone, Manual therapy, and Re-evaluation  PLAN FOR NEXT SESSION: Assess and progress HEP as indicated, strengthening, postural stability   Reather Laurence, PT, DPT 04/19/23, 11:45 AM  [facility] [address] [place], [place] [address] Phone # [phone] Fax [phone]

## 2023-04-20 DIAGNOSIS — J069 Acute upper respiratory infection, unspecified: Secondary | ICD-10-CM | POA: Diagnosis not present

## 2023-04-20 DIAGNOSIS — E46 Unspecified protein-calorie malnutrition: Secondary | ICD-10-CM | POA: Diagnosis not present

## 2023-04-21 ENCOUNTER — Ambulatory Visit: Payer: Medicare Other | Admitting: Rehabilitative and Restorative Service Providers"

## 2023-04-21 ENCOUNTER — Encounter: Payer: Self-pay | Admitting: Rehabilitative and Restorative Service Providers"

## 2023-04-21 DIAGNOSIS — R293 Abnormal posture: Secondary | ICD-10-CM

## 2023-04-21 DIAGNOSIS — M6281 Muscle weakness (generalized): Secondary | ICD-10-CM | POA: Diagnosis not present

## 2023-04-21 DIAGNOSIS — R252 Cramp and spasm: Secondary | ICD-10-CM | POA: Diagnosis not present

## 2023-04-21 NOTE — Therapy (Signed)
OUTPATIENT PHYSICAL THERAPY TREATMENT NOTE   Patient Name: Sandra Day MRN: [id number] DOB:Jun 15, 1946, 77 y.o., female Today's Date: 04/21/2023  END OF SESSION:  PT End of Session - 04/21/23 1104     Visit Number 10    Date for PT Re-Evaluation 05/06/23    Authorization Type BC/BS Medicare    Progress Note Due on Visit 10    PT Start Time 1100    PT Stop Time 1140    PT Time Calculation (min) 40 min    Activity Tolerance Patient tolerated treatment well    Behavior During Therapy [facility] for tasks assessed/performed              Past Medical History:  Diagnosis Date   Anxiety    Arthritis of both knees 03/22/2017   Asthma    as a child, no issues in adulthood   Depression    Fatty liver    GERD (gastroesophageal reflux disease)    Hypertension    Multiple thyroid nodules    Osteoporosis 03/22/2017   Pancytopenia (HCC) 03/22/2017   Past Surgical History:  Procedure Laterality Date   ANTERIOR CERVICAL DECOMP/DISCECTOMY FUSION N/A 02/21/2023   Procedure: Cervical Three-Cervical Four, Cervical Four - Cervical Five, Cervical Five-Cervical Six  Anterior Cervical Discectomy Fusion;  Surgeon: Barnett Abu, MD;  Location: [facility] OR;  Service: Neurosurgery;  Laterality: N/A;  RM 21 3C   KNEE SURGERY Left    Patient Active Problem List   Diagnosis Date Noted   Cervical spondylosis with myelopathy 02/21/2023   Pancytopenia (HCC) 03/22/2017   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   Arthritis of both knees 03/22/2017   Osteoporosis 03/22/2017    PCP: Sigmund Hazel, MD  REFERRING PROVIDER: Barnett Abu, MD  REFERRING DIAG: G95.9 (ICD-10-CM) - Disease of spinal cord, unspecified  THERAPY DIAG:  Muscle weakness (generalized)  Cramp and spasm  Abnormal posture  Rationale for Evaluation and Treatment: Rehabilitation  ONSET DATE: Anterior cervical decompression C3-4 C4-5 C5-6 on 02/21/2023  SUBJECTIVE:                                                                                                                                                                                                          SUBJECTIVE STATEMENT: Pt reports pain still 4/10 and does state some tingling down left arm today.  Hand dominance: Right  PERTINENT HISTORY:  Anterior cervical decompression C3-4 C4-5 C5-6 on 02/21/2023  PAIN:  Are you having pain? Yes: NPRS scale: 4/10 Pain location: right upper arm primarily Pain description: sore Aggravating factors: movement Relieving  factors: rest  PRECAUTIONS: None  RED FLAGS: None     WEIGHT BEARING RESTRICTIONS: No  FALLS:  Has patient fallen in last 6 months? 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  NEXT MD VISIT: 2 months with Dr Danielle Dess  OBJECTIVE:   DIAGNOSTIC FINDINGS:  Cervical Radiograph on 02/21/2023: FINDINGS: Cross-table lateral images were obtained for surgical planning purposes. These demonstrate placement of a metallic marker overlying the Q6-5 disc space. Second image demonstrates placement of an ACDF with intervertebral spacers at C3-6.  Cervical CT Scan on 03/24/2023: IMPRESSION: 1. Uncomplicated C3-C6 ACDF with bridging bone seen through each cage. 2. No interval adjacent segment degeneration when compared to preoperative MRI. 3. Heterogeneous enlargement the left lobe thyroid. Recommend thyroid ultrasound (ref: J Am Coll Radiol. 2015 Feb;12(2): 143-50).  PATIENT SURVEYS:  Eval:  Quick Dash 40.91  COGNITION: Overall cognitive status: Within functional limits for tasks assessed  SENSATION: Reports minimal numbness and tingling in right hand, but has improved  since before surgery  POSTURE: rounded shoulders  CERVICAL ROM:   Active ROM A/ROM (deg) eval  Flexion 47  Extension 45  Right lateral flexion 32  Left lateral flexion 40  Right rotation 62  Left rotation 65   (Blank rows = not tested)  UPPER EXTREMITY ROM:  Active ROM Right eval Right 04/07/23 Right 04/19/23 Left eval  Shoulder flexion 35 57 60 160  Shoulder extension 65 65 65 80  Shoulder abduction 55 60 85 155  Shoulder adduction      Shoulder internal rotation      Shoulder external rotation       (Blank rows = not tested)  UPPER EXTREMITY MMT:  MMT Right eval Left eval  Shoulder flexion 2 5  Shoulder extension 3 5  Shoulder abduction 2 5  Shoulder adduction    Shoulder extension    Shoulder internal rotation    Shoulder external rotation    Middle trapezius    Lower trapezius    Elbow flexion    Elbow extension    Wrist flexion    Wrist extension    Wrist ulnar deviation    Wrist radial deviation    Wrist pronation    Wrist supination    Grip strength 40 lbs 47 lbs   (Blank rows = not tested)   TODAY'S TREATMENT:                                                                                                                               DATE: 04/21/2023  UBE level 1.0 x3 min each direction with PT present to  discuss status Supine cervical retraction 2x10 Supine shoulder press into mat 2x10 Supine shoulder flexion 3# AA/ROM with cane 2x10 Supine chest press 3# with cane 2x10 Supine serratus punch with 2# 2x10 Supine finger tracing of alphabet with arm in 90 deg flexion and 2# A-Z Standing with green pball right flexion and right abduction x10 each Standing green pball bouncing 2x10 Standing UE Ranger for right shoulder flexion 2x10 Standing right finger ladder for shoulder flexion and abduction x10 each   DATE: 04/19/2023  UBE level 1.0 x3 min each direction with PT present to discuss status Standing UE Ranger for right shoulder flexion  2x10 Right shoulder A/ROM in standing Standing shoulder finger ladder for right shoulder flexion  x10 (to 31) Bilateral small yellow physioball up wall x10 Supine cervical retraction 2x10 Supine shoulder press into mat 2x10 Supine shoulder flexion 2# AA/ROM with cane 2x10 Supine chest press 2# with cane 2x10 Supine serratus punch with 1# 2x10 Supine finger tracing of alphabet with arm in 90 deg flexion A-Z   DATE: 04/14/2023  Nustep level 5 x6 min with PT present to discuss status Prone right shoulder:  flexion, horizontal abduction, extension.  2x10 each Prone shoulder row with 2# 2x10 RUE Supine shoulder flexion 2# AA/ROM with cane 2x10 Supine chest press 2# with cane 2x10 Supine right shoulder abduction AA/ROM with cane 2x10 Supine serratus punch 2x10 Supine finger tracing of alphabet with arm in 90 deg flexion A-Z Standing shoulder rows with green tband 2x10 Standing shoulder extension with green tband 2x10 Standing shoulder finger ladder x10    PATIENT EDUCATION:  Education details: Issued HEP Person educated: Patient Education method: Explanation, Facilities manager, and Handouts Education comprehension: verbalized understanding and returned demonstration  HOME EXERCISE PROGRAM: Access Code: [email or web address] URL: [email or web address] Date: 04/07/2023 Prepared by: Reather Laurence  Exercises - Seated Shoulder Flexion Towel Slide at Table Top  - 1 x daily - 7 x weekly - 2 sets - 10 reps - Seated Shoulder Abduction Towel Slide at Table Top  - 1 x daily - 7 x weekly - 2 sets - 10 reps - Shoulder Flexion Wall Slide with Towel  - 1 x daily - 7 x weekly - 2 sets - 10 reps - Supine Shoulder Flexion Extension AAROM with Dowel  - 1 x daily - 7 x weekly - 2 sets - 10 reps - Supine Chest Press with Dumbbells  - 1 x daily - 7 x weekly - 2 sets - 10 reps - Supine Shoulder Press AAROM in Abduction with Dowel  - 1 x daily - 7 x weekly - 2 sets - 10 reps - Sidelying Shoulder External  Rotation  - 1 x daily - 7 x weekly - 2 sets - 10 reps - Standing Shoulder Row with Anchored Resistance  - 1 x daily - 7 x weekly - 2 sets - 10 reps - Shoulder extension with resistance - Neutral  - 1 x daily - 7 x weekly - 2 sets - 10 reps  ASSESSMENT:  CLINICAL IMPRESSION: Ms Zelada presents to skilled PT reporting continued progress and reports that she can feel that she is gaining more use of her right arm.  Patient able to progress with weights during session today.  Patient with some reports of muscle fatigue with bouncing green pball on the floor.  Patient does continue to have some pain, but does not increase during treatment session.   OBJECTIVE IMPAIRMENTS: decreased ROM, decreased strength, increased muscle spasms, impaired flexibility, postural dysfunction, and  pain.   ACTIVITY LIMITATIONS: carrying, lifting, reach over head, and hygiene/grooming  PARTICIPATION LIMITATIONS: meal prep, cleaning, laundry, community activity, and occupation  PERSONAL FACTORS: Fitness and 1 comorbidity: s/p cervical ACDF on 02/21/2023  are also affecting patient's functional outcome.   REHAB POTENTIAL: Good  CLINICAL DECISION MAKING: Stable/uncomplicated  EVALUATION COMPLEXITY: Low   GOALS: Goals reviewed with patient? No  SHORT TERM GOALS: Target date: 04/01/2023  Pt will be independent with initial HEP. Baseline:  Goal status: MET  2.  Pt will report at least 25% decrease in symptoms. Baseline:  Goal status: MET   LONG TERM GOALS: Target date: 05/06/2023  Pt will be independent with initial HEP. Baseline:  Goal status: Ongoing  2.  Pt will increase Quick DASH to no greater than 25 to demonstrate her ability to complete functional tasks. Baseline: 40.91 Goal status: INITIAL  3.  Pt will increase right shoulder strength to at least 4/5 to allow her to lift objects with her right hand and use for ADLs/IADLs. Baseline: 2/5 Goal status: Ongoing  4.  Patient will increase her right  shoulder A/ROM to at least 110 degrees flexion and abduction to allow her to reach into overhead cabinets. Baseline: see above Goal status: Ongoing  5.  Patient will increase right grip strength to greater than 50 pounds to allow her to grab and hold items in the home. Baseline: 40 lbs Goal status: INITIAL  6.  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 Baseline:  Goal status: Ongoing   PLAN:  PT FREQUENCY: 2x/week  PT DURATION: 8 weeks  PLANNED INTERVENTIONS: Therapeutic exercises, Therapeutic activity, Neuromuscular re-education, Balance training, Gait training, Patient/Family education, Self Care, Joint mobilization, Joint manipulation, Aquatic Therapy, Dry Needling, Spinal manipulation, Spinal mobilization, Cryotherapy, Moist heat, scar mobilization, Taping, Traction, Ultrasound, Ionotophoresis 4mg /ml Dexamethasone, Manual therapy, and Re-evaluation  PLAN FOR NEXT SESSION: Assess and progress HEP as indicated, strengthening, postural stability   Reather Laurence, PT, DPT 04/21/23, 12:51 PM  [facility] [address], [address] [place], [place] [address] Phone # [phone] Fax [phone]

## 2023-04-26 ENCOUNTER — Ambulatory Visit: Payer: Medicare Other | Admitting: Rehabilitative and Restorative Service Providers"

## 2023-04-26 ENCOUNTER — Encounter: Payer: Self-pay | Admitting: Rehabilitative and Restorative Service Providers"

## 2023-04-26 DIAGNOSIS — M6281 Muscle weakness (generalized): Secondary | ICD-10-CM | POA: Diagnosis not present

## 2023-04-26 DIAGNOSIS — R293 Abnormal posture: Secondary | ICD-10-CM

## 2023-04-26 DIAGNOSIS — R252 Cramp and spasm: Secondary | ICD-10-CM

## 2023-04-26 NOTE — Therapy (Signed)
OUTPATIENT PHYSICAL THERAPY TREATMENT NOTE   Patient Name: Sandra Day MRN: [id number] DOB:[birth year], 77 y.o., female Today's Date: 04/26/2023   Progress Note Reporting Period 03/15/2023 to 04/26/2023  See note below for Objective Data and Assessment of Progress/Goals.       END OF SESSION:  PT End of Session - 04/26/23 1108     Visit Number 11    Date for PT Re-Evaluation 05/06/23    Authorization Type BC/BS Medicare    Progress Note Due on Visit 20    PT Start Time 1102    PT Stop Time 1140    PT Time Calculation (min) 38 min    Activity Tolerance Patient tolerated treatment well    Behavior During Therapy WFL for tasks assessed/performed              Past Medical History:  Diagnosis Date   Anxiety    Arthritis of both knees 03/22/2017   Asthma    as a child, no issues in adulthood   Depression    Fatty liver    GERD (gastroesophageal reflux disease)    Hypertension    Multiple thyroid nodules    Osteoporosis 03/22/2017   Pancytopenia (HCC) 03/22/2017   Past Surgical History:  Procedure Laterality Date   ANTERIOR CERVICAL DECOMP/DISCECTOMY FUSION N/A 02/21/2023   Procedure: Cervical Three-Cervical Four, Cervical Four - Cervical Five, Cervical Five-Cervical Six  Anterior Cervical Discectomy Fusion;  Surgeon: Barnett Abu, MD;  Location: [facility] OR;  Service: Neurosurgery;  Laterality: N/A;  RM 21 3C   KNEE SURGERY Left    Patient Active Problem List   Diagnosis Date Noted   Cervical spondylosis with myelopathy 02/21/2023   Pancytopenia (HCC) 03/22/2017   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   Arthritis of both knees 03/22/2017   Osteoporosis 03/22/2017    PCP: Sigmund Hazel, MD  REFERRING PROVIDER: Barnett Abu, MD  REFERRING DIAG: G95.9 (ICD-10-CM) - Disease of spinal cord, unspecified  THERAPY DIAG:  Muscle weakness (generalized)  Cramp and spasm  Abnormal posture  Rationale for Evaluation and Treatment: Rehabilitation  ONSET  DATE: Anterior cervical decompression C3-4 C4-5 C5-6 on 02/21/2023  SUBJECTIVE:                                                                                                                                                                                                         SUBJECTIVE STATEMENT: Pt reports pain is feeling similar to last visit.  Hand dominance: Right  PERTINENT HISTORY:  Anterior cervical decompression C3-4 C4-5 C5-6 on 02/21/2023  PAIN:  Are  you having pain? Yes: NPRS scale: 4/10 Pain location: right upper arm primarily Pain description: sore Aggravating factors: movement Relieving factors: rest  PRECAUTIONS: None  RED FLAGS: None     WEIGHT BEARING RESTRICTIONS: No  FALLS:  Has patient fallen in last 6 months? 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  NEXT MD VISIT: 2 months with Dr Danielle Dess  OBJECTIVE:   DIAGNOSTIC FINDINGS:  Cervical Radiograph on 02/21/2023: FINDINGS: Cross-table lateral images were obtained for surgical planning purposes. These demonstrate placement of a metallic marker overlying the N8-2 disc space. Second image demonstrates placement of an ACDF with intervertebral spacers at C3-6.  Cervical CT Scan on 03/24/2023: IMPRESSION: 1. Uncomplicated C3-C6 ACDF with bridging bone seen through each cage. 2. No interval adjacent segment degeneration when compared to preoperative MRI. 3. Heterogeneous enlargement the left lobe thyroid. Recommend thyroid ultrasound (ref: J Am Coll Radiol. 2015 Feb;12(2): 143-50).  PATIENT SURVEYS:  Eval:  Quick Dash 40.91  COGNITION: Overall cognitive status: Within  functional limits for tasks assessed  SENSATION: Reports minimal numbness and tingling in right hand, but has improved since before surgery  POSTURE: rounded shoulders  CERVICAL ROM:   Active ROM A/ROM (deg) eval  Flexion 47  Extension 45  Right lateral flexion 32  Left lateral flexion 40  Right rotation 62  Left rotation 65   (Blank rows = not tested)  UPPER EXTREMITY ROM:  Active ROM Right eval Right 04/07/23 Right 04/19/23 Left eval  Shoulder flexion 35 57 60 160  Shoulder extension 65 65 65 80  Shoulder abduction 55 60 85 155  Shoulder adduction      Shoulder internal rotation      Shoulder external rotation       (Blank rows = not tested)  UPPER EXTREMITY MMT:  MMT Right eval Right 04/26/23 Left eval  Shoulder flexion 2 2+ 5  Shoulder extension 3 3+ 5  Shoulder abduction 2 2+ 5  Grip strength 40 lbs 47 lbs 47 lbs   (Blank rows = not tested)   TODAY'S TREATMENT:                                                                                                                               DATE: 04/26/2023  UBE level 1.0 x3 min each direction with PT present to discuss status Standing right finger ladder for shoulder flexion 2x10  Standing UE Ranger for right shoulder flexion 2x10 Bilateral small yellow physioball up wall x15 Supine serratus punch with 3# 2x10 Supine finger tracing of alphabet with arm in 90 deg flexion and 3# A-Z Supine  shoulder flexion 3# AA/ROM with cane 2x10 Supine chest press 3# with cane 2x10   DATE: 04/21/2023  UBE level 1.0 x3 min each direction with PT present to discuss status Supine cervical retraction 2x10 Supine shoulder press into mat 2x10 Supine shoulder flexion 3# AA/ROM with cane 2x10 Supine chest press 3# with cane 2x10 Supine serratus punch with 2# 2x10 Supine finger tracing of alphabet with arm in 90 deg flexion and 2# A-Z Standing with green pball right flexion and right abduction x10 each Standing green pball  bouncing 2x10 Standing UE Ranger for right shoulder flexion 2x10 Standing right finger ladder for shoulder flexion and abduction x10 each   DATE: 04/19/2023  UBE level 1.0 x3 min each direction with PT present to discuss status Standing UE Ranger for right shoulder flexion 2x10 Right shoulder A/ROM in standing Standing shoulder finger ladder for right shoulder flexion  x10 (to 31) Bilateral small yellow physioball up wall x10 Supine cervical retraction 2x10 Supine shoulder press into mat 2x10 Supine shoulder flexion 2# AA/ROM with cane 2x10 Supine chest press 2# with cane 2x10 Supine serratus punch with 1# 2x10 Supine finger tracing of alphabet with arm in 90 deg flexion A-Z     PATIENT EDUCATION:  Education details: Issued HEP Person educated: Patient Education method: Explanation, Facilities manager, and Handouts Education comprehension: verbalized understanding and returned demonstration  HOME EXERCISE PROGRAM: Access Code: [email or web address] URL: [email or web address] Date: 04/07/2023 Prepared by: Reather Laurence  Exercises - Seated Shoulder Flexion Towel Slide at Table Top  - 1 x daily - 7 x weekly - 2 sets - 10 reps - Seated Shoulder Abduction Towel Slide at Table Top  - 1 x daily - 7 x weekly - 2 sets - 10 reps - Shoulder Flexion Wall Slide with Towel  - 1 x daily - 7 x weekly - 2 sets - 10 reps - Supine Shoulder Flexion Extension AAROM with Dowel  - 1 x daily - 7 x weekly - 2 sets - 10 reps - Supine Chest Press with Dumbbells  - 1 x daily - 7 x weekly - 2 sets - 10 reps - Supine Shoulder Press AAROM in Abduction with Dowel  - 1 x daily - 7 x weekly - 2 sets - 10 reps - Sidelying Shoulder External Rotation  - 1 x daily - 7 x weekly - 2 sets - 10 reps - Standing Shoulder Row with Anchored Resistance  - 1 x daily - 7 x weekly - 2 sets - 10 reps - Shoulder extension with resistance - Neutral  - 1 x daily - 7 x weekly - 2 sets - 10 reps  ASSESSMENT:  CLINICAL  IMPRESSION: Ms Feldhaus presents to skilled PT reporting that she can tell that she is slowly getting stronger and having more use of her arm.  Patient with increased right grip strength noted at session today.  Patient continues to progress and improve in her ability to perform assisted overhead activities.  Patient continues to progress with goal related activities.   OBJECTIVE IMPAIRMENTS: decreased ROM, decreased strength, increased muscle spasms, impaired flexibility, postural dysfunction, and pain.   ACTIVITY LIMITATIONS: carrying, lifting, reach over head, and hygiene/grooming  PARTICIPATION LIMITATIONS: meal prep, cleaning, laundry, community activity, and occupation  PERSONAL FACTORS: Fitness and 1 comorbidity: s/p cervical ACDF on 02/21/2023  are also affecting patient's functional outcome.   REHAB POTENTIAL: Good  CLINICAL DECISION MAKING: Stable/uncomplicated  EVALUATION COMPLEXITY: Low   GOALS: Goals reviewed with patient? No  SHORT TERM  GOALS: Target date: 04/01/2023  Pt will be independent with initial HEP. Baseline:  Goal status: MET  2.  Pt will report at least 25% decrease in symptoms. Baseline:  Goal status: MET   LONG TERM GOALS: Target date: 05/06/2023  Pt will be independent with initial HEP. Baseline:  Goal status: Ongoing  2.  Pt will increase Quick DASH to no greater than 25 to demonstrate her ability to complete functional tasks. Baseline: 40.91 Goal status: INITIAL  3.  Pt will increase right shoulder strength to at least 4/5 to allow her to lift objects with her right hand and use for ADLs/IADLs. Baseline: 2/5 Goal status: Ongoing  4.  Patient will increase her right shoulder A/ROM to at least 110 degrees flexion and abduction to allow her to reach into overhead cabinets. Baseline: see above Goal status: Ongoing  5.  Patient will increase right grip strength to greater than 50 pounds to allow her to grab and hold items in the home. Baseline: 40  lbs Goal status: Ongoing  6.  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 Baseline:  Goal status: Ongoing   PLAN:  PT FREQUENCY: 2x/week  PT DURATION: 8 weeks  PLANNED INTERVENTIONS: Therapeutic exercises, Therapeutic activity, Neuromuscular re-education, Balance training, Gait training, Patient/Family education, Self Care, Joint mobilization, Joint manipulation, Aquatic Therapy, Dry Needling, Spinal manipulation, Spinal mobilization, Cryotherapy, Moist heat, scar mobilization, Taping, Traction, Ultrasound, Ionotophoresis 4mg /ml Dexamethasone, Manual therapy, and Re-evaluation  PLAN FOR NEXT SESSION: Assess and progress HEP as indicated, strengthening, postural stability   Reather Laurence, PT, DPT 04/26/23, 11:43 AM  [facility] [address], [address] [place], [place] [address] Phone # [phone] Fax [phone]

## 2023-04-28 ENCOUNTER — Ambulatory Visit: Payer: Medicare Other | Admitting: Rehabilitative and Restorative Service Providers"

## 2023-04-28 ENCOUNTER — Encounter: Payer: Self-pay | Admitting: Rehabilitative and Restorative Service Providers"

## 2023-04-28 DIAGNOSIS — R293 Abnormal posture: Secondary | ICD-10-CM | POA: Diagnosis not present

## 2023-04-28 DIAGNOSIS — M6281 Muscle weakness (generalized): Secondary | ICD-10-CM | POA: Diagnosis not present

## 2023-04-28 DIAGNOSIS — R252 Cramp and spasm: Secondary | ICD-10-CM

## 2023-04-28 NOTE — Therapy (Signed)
OUTPATIENT PHYSICAL THERAPY TREATMENT NOTE   Patient Name: Sandra Day MRN: [id number] DOB:10-Oct-1945, 77 y.o., female Today's Date: 04/28/2023   Progress Note Reporting Period 03/15/2023 to 04/26/2023  See note below for Objective Data and Assessment of Progress/Goals.       END OF SESSION:  PT End of Session - 04/28/23 1104     Visit Number 12    Date for PT Re-Evaluation 05/06/23    Authorization Type BC/BS Medicare    Progress Note Due on Visit 20    PT Start Time 1059    PT Stop Time 1140    PT Time Calculation (min) 41 min    Activity Tolerance Patient tolerated treatment well    Behavior During Therapy WFL for tasks assessed/performed              Past Medical History:  Diagnosis Date   Anxiety    Arthritis of both knees 03/22/2017   Asthma    as a child, no issues in adulthood   Depression    Fatty liver    GERD (gastroesophageal reflux disease)    Hypertension    Multiple thyroid nodules    Osteoporosis 03/22/2017   Pancytopenia (HCC) 03/22/2017   Past Surgical History:  Procedure Laterality Date   ANTERIOR CERVICAL DECOMP/DISCECTOMY FUSION N/A 02/21/2023   Procedure: Cervical Three-Cervical Four, Cervical Four - Cervical Five, Cervical Five-Cervical Six  Anterior Cervical Discectomy Fusion;  Surgeon: Barnett Abu, MD;  Location: [facility] OR;  Service: Neurosurgery;  Laterality: N/A;  RM 21 3C   KNEE SURGERY Left    Patient Active Problem List   Diagnosis Date Noted   Cervical spondylosis with myelopathy 02/21/2023   Pancytopenia (HCC) 03/22/2017   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   Arthritis of both knees 03/22/2017   Osteoporosis 03/22/2017    PCP: Sigmund Hazel, MD  REFERRING PROVIDER: Barnett Abu, MD  REFERRING DIAG: G95.9 (ICD-10-CM) - Disease of spinal cord, unspecified  THERAPY DIAG:  Muscle weakness (generalized)  Cramp and spasm  Abnormal posture  Rationale for Evaluation and Treatment: Rehabilitation  ONSET  DATE: Anterior cervical decompression C3-4 C4-5 C5-6 on 02/21/2023  SUBJECTIVE:                                                                                                                                                                                                         SUBJECTIVE STATEMENT: Pt reports pain is similar to previously, but states that she seems to be getting more motion in her shoulder.  Hand dominance: Right  PERTINENT HISTORY:  Anterior  cervical decompression C3-4 C4-5 C5-6 on 02/21/2023  PAIN:  Are you having pain? Yes: NPRS scale: 4/10 Pain location: right upper arm primarily Pain description: sore Aggravating factors: movement Relieving factors: rest  PRECAUTIONS: None  RED FLAGS: None     WEIGHT BEARING RESTRICTIONS: No  FALLS:  Has patient fallen in last 6 months? 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  NEXT MD VISIT: 2 months with Dr Danielle Dess  OBJECTIVE:   DIAGNOSTIC FINDINGS:  Cervical Radiograph on 02/21/2023: FINDINGS: Cross-table lateral images were obtained for surgical planning purposes. These demonstrate placement of a metallic marker overlying the Z6-6 disc space. Second image demonstrates placement of an ACDF with intervertebral spacers at C3-6.  Cervical CT Scan on 03/24/2023: IMPRESSION: 1. Uncomplicated C3-C6 ACDF with bridging bone seen through each cage. 2. No interval adjacent segment degeneration when compared to preoperative MRI. 3. Heterogeneous enlargement the left lobe thyroid. Recommend thyroid ultrasound (ref: J Am Coll Radiol. 2015 Feb;12(2): 143-50).  PATIENT SURVEYS:  Eval:  Quick Dash  40.91  COGNITION: Overall cognitive status: Within functional limits for tasks assessed  SENSATION: Reports minimal numbness and tingling in right hand, but has improved since before surgery  POSTURE: rounded shoulders  CERVICAL ROM:   Active ROM A/ROM (deg) eval  Flexion 47  Extension 45  Right lateral flexion 32  Left lateral flexion 40  Right rotation 62  Left rotation 65   (Blank rows = not tested)  UPPER EXTREMITY ROM:  Active ROM Right eval Right 04/07/23 Right 04/19/23 Left eval  Shoulder flexion 35 57 60 160  Shoulder extension 65 65 65 80  Shoulder abduction 55 60 85 155  Shoulder adduction      Shoulder internal rotation      Shoulder external rotation       (Blank rows = not tested)  UPPER EXTREMITY MMT:  MMT Right eval Right 04/26/23 Left eval  Shoulder flexion 2 2+ 5  Shoulder extension 3 3+ 5  Shoulder abduction 2 2+ 5  Grip strength 40 lbs 47 lbs 47 lbs   (Blank rows = not tested)   TODAY'S TREATMENT:                                                                                                                               DATE: 04/28/2023  Nustep level 5 x6 min with PT present to discuss status Supine shoulder flexion 3# AA/ROM with cane 2x10 Supine chest press 3# with cane 2x10 Supine serratus punch with 3# 2x10 Supine finger tracing of alphabet with arm in 90 deg flexion and 3# A-Z Left  side-lying right shoulder ER 2x10 Barre push-ups 2x10 Bilateral small yellow physioball up wall with 1# on right wrist 2x10 Standing UE Ranger for right shoulder flexion with 1# on right wrist 2x10 Finger ladder for right shoulder abduction x15   DATE: 04/26/2023  UBE level 1.0 x3 min each direction with PT present to discuss status Standing right finger ladder for shoulder flexion 2x10  Standing UE Ranger for right shoulder flexion 2x10 Bilateral small yellow physioball up wall x15 Supine serratus punch with 3# 2x10 Supine finger tracing of alphabet  with arm in 90 deg flexion and 3# A-Z Supine shoulder flexion 3# AA/ROM with cane 2x10 Supine chest press 3# with cane 2x10   DATE: 04/21/2023  UBE level 1.0 x3 min each direction with PT present to discuss status Supine cervical retraction 2x10 Supine shoulder press into mat 2x10 Supine shoulder flexion 3# AA/ROM with cane 2x10 Supine chest press 3# with cane 2x10 Supine serratus punch with 2# 2x10 Supine finger tracing of alphabet with arm in 90 deg flexion and 2# A-Z Standing with green pball right flexion and right abduction x10 each Standing green pball bouncing 2x10 Standing UE Ranger for right shoulder flexion 2x10 Standing right finger ladder for shoulder flexion and abduction x10 each     PATIENT EDUCATION:  Education details: Issued HEP Person educated: Patient Education method: Programmer, multimedia, Facilities manager, and Handouts Education comprehension: verbalized understanding and returned demonstration  HOME EXERCISE PROGRAM: Access Code: [email or web address] URL: [email or web address] Date: 04/07/2023 Prepared by: Reather Laurence  Exercises - Seated Shoulder Flexion Towel Slide at Table Top  - 1 x daily - 7 x weekly - 2 sets - 10 reps - Seated Shoulder Abduction Towel Slide at Table Top  - 1 x daily - 7 x weekly - 2 sets - 10 reps - Shoulder Flexion Wall Slide with Towel  - 1 x daily - 7 x weekly - 2 sets - 10 reps - Supine Shoulder Flexion Extension AAROM with Dowel  - 1 x daily - 7 x weekly - 2 sets - 10 reps - Supine Chest Press with Dumbbells  - 1 x daily - 7 x weekly - 2 sets - 10 reps - Supine Shoulder Press AAROM in Abduction with Dowel  - 1 x daily - 7 x weekly - 2 sets - 10 reps - Sidelying Shoulder External Rotation  - 1 x daily - 7 x weekly - 2 sets - 10 reps - Standing Shoulder Row with Anchored Resistance  - 1 x daily - 7 x weekly - 2 sets - 10 reps - Shoulder extension with resistance - Neutral  - 1 x daily - 7 x weekly - 2 sets - 10  reps  ASSESSMENT:  CLINICAL IMPRESSION: Sandra Day presents to skilled PT reporting overall progress towards goal related activities and stating that she can feel that her shoulder motion is improving.  Patient able to add more weight with supported AA/ROM activities in clinic in standing.  Pt without reports of increased pain during session, but did report muscle fatigue.  Patient continues to progress towards goal related activities.   OBJECTIVE IMPAIRMENTS: decreased ROM, decreased strength, increased muscle spasms, impaired flexibility, postural dysfunction, and pain.   ACTIVITY LIMITATIONS: carrying, lifting, reach over head, and hygiene/grooming  PARTICIPATION LIMITATIONS: meal prep, cleaning, laundry, community activity, and occupation  PERSONAL FACTORS: Fitness and 1 comorbidity: s/p cervical ACDF on 02/21/2023  are also affecting patient's functional outcome.   REHAB POTENTIAL: Good  CLINICAL DECISION MAKING: Stable/uncomplicated  EVALUATION COMPLEXITY: Low   GOALS: Goals reviewed with patient? No  SHORT TERM GOALS: Target date: 04/01/2023  Pt will be independent with initial HEP. Baseline:  Goal status: MET  2.  Pt will report at least 25% decrease in symptoms. Baseline:  Goal status: MET   LONG TERM GOALS: Target date: 05/06/2023  Pt will be independent with initial HEP. Baseline:  Goal status: Ongoing  2.  Pt will increase Quick DASH to no greater than 25 to demonstrate her ability to complete functional tasks. Baseline: 40.91 Goal status: INITIAL  3.  Pt will increase right shoulder strength to at least 4/5 to allow her to lift objects with her right hand and use for ADLs/IADLs. Baseline: 2/5 Goal status: Ongoing  4.  Patient will increase her right shoulder A/ROM to at least 110 degrees flexion and abduction to allow her to reach into overhead cabinets. Baseline: see above Goal status: Ongoing  5.  Patient will increase right grip strength to greater  than 50 pounds to allow her to grab and hold items in the home. Baseline: 40 lbs Goal status: Ongoing  6.  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 Baseline:  Goal status: Ongoing   PLAN:  PT FREQUENCY: 2x/week  PT DURATION: 8 weeks  PLANNED INTERVENTIONS: Therapeutic exercises, Therapeutic activity, Neuromuscular re-education, Balance training, Gait training, Patient/Family education, Self Care, Joint mobilization, Joint manipulation, Aquatic Therapy, Dry Needling, Spinal manipulation, Spinal mobilization, Cryotherapy, Moist heat, scar mobilization, Taping, Traction, Ultrasound, Ionotophoresis 4mg /ml Dexamethasone, Manual therapy, and Re-evaluation  PLAN FOR NEXT SESSION: Assess and progress HEP as indicated, strengthening, postural stability   Reather Laurence, PT, DPT 04/28/23, 11:43 AM  [facility] [address] [place], [place] [address] Phone # [phone] Fax [phone]

## 2023-05-03 ENCOUNTER — Encounter: Payer: Self-pay | Admitting: Rehabilitative and Restorative Service Providers"

## 2023-05-03 ENCOUNTER — Ambulatory Visit: Payer: Medicare Other | Attending: Neurological Surgery | Admitting: Rehabilitative and Restorative Service Providers"

## 2023-05-03 DIAGNOSIS — M6281 Muscle weakness (generalized): Secondary | ICD-10-CM | POA: Insufficient documentation

## 2023-05-03 DIAGNOSIS — R252 Cramp and spasm: Secondary | ICD-10-CM | POA: Diagnosis not present

## 2023-05-03 DIAGNOSIS — R293 Abnormal posture: Secondary | ICD-10-CM | POA: Insufficient documentation

## 2023-05-03 NOTE — Therapy (Signed)
OUTPATIENT PHYSICAL THERAPY TREATMENT NOTE AND REASSESSMENT NOTE   Patient Name: Sandra Day MRN: [id number] DOB:May 24, 1946, 77 y.o., female Today's Date: 05/03/2023    END OF SESSION:  PT End of Session - 05/03/23 1144     Visit Number 13    Date for PT Re-Evaluation 07/01/23    Authorization Type BC/BS Medicare    Progress Note Due on Visit 20    PT Start Time 1145    PT Stop Time 1225    PT Time Calculation (min) 40 min    Activity Tolerance Patient tolerated treatment well    Behavior During Therapy [facility] for tasks assessed/performed              Past Medical History:  Diagnosis Date   Anxiety    Arthritis of both knees 03/22/2017   Asthma    as a child, no issues in adulthood   Depression    Fatty liver    GERD (gastroesophageal reflux disease)    Hypertension    Multiple thyroid nodules    Osteoporosis 03/22/2017   Pancytopenia (HCC) 03/22/2017   Past Surgical History:  Procedure Laterality Date   ANTERIOR CERVICAL DECOMP/DISCECTOMY FUSION N/A 02/21/2023   Procedure: Cervical Three-Cervical Four, Cervical Four - Cervical Five, Cervical Five-Cervical Six  Anterior Cervical Discectomy Fusion;  Surgeon: Barnett Abu, MD;  Location: [facility] OR;  Service: Neurosurgery;  Laterality: N/A;  RM 21 3C   KNEE SURGERY Left    Patient Active Problem List   Diagnosis Date Noted   Cervical spondylosis with myelopathy 02/21/2023   Pancytopenia (HCC) 03/22/2017   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   Arthritis of both knees 03/22/2017   Osteoporosis 03/22/2017    PCP: Sigmund Hazel, MD  REFERRING PROVIDER: Barnett Abu, MD  REFERRING DIAG: G95.9 (ICD-10-CM) - Disease of spinal cord, unspecified  THERAPY DIAG:  Muscle weakness (generalized) - Plan: PT plan of care cert/re-cert  Cramp and spasm - Plan: PT plan of care cert/re-cert  Abnormal posture - Plan: PT plan of care cert/re-cert  Rationale for Evaluation and Treatment: Rehabilitation  ONSET DATE:  Anterior cervical decompression C3-4 C4-5 C5-6 on 02/21/2023  SUBJECTIVE:                                                                                                                                                                                                         SUBJECTIVE STATEMENT: Pt reports that this morning, she can tell that she is having more arm motion now.  Hand dominance: Right  PERTINENT HISTORY:  Anterior cervical decompression C3-4  C4-5 C5-6 on 02/21/2023  PAIN:  Are you having pain? Yes: NPRS scale: 2/10 Pain location: left shoulder primarily Pain description: sore Aggravating factors: movement Relieving factors: rest  PRECAUTIONS: None  RED FLAGS: None     WEIGHT BEARING RESTRICTIONS: No  FALLS:  Has patient fallen in last 6 months? 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  NEXT MD VISIT: 2 months with Dr Danielle Dess  OBJECTIVE:   DIAGNOSTIC FINDINGS:  Cervical Radiograph on 02/21/2023: FINDINGS: Cross-table lateral images were obtained for surgical planning purposes. These demonstrate placement of a metallic marker overlying the J4-7 disc space. Second image demonstrates placement of an ACDF with intervertebral spacers at C3-6.  Cervical CT Scan on 03/24/2023: IMPRESSION: 1. Uncomplicated C3-C6 ACDF with bridging bone seen through each cage. 2. No interval adjacent segment degeneration when compared to preoperative MRI. 3. Heterogeneous enlargement the left lobe thyroid. Recommend thyroid ultrasound (ref: J Am Coll Radiol. 2015 Feb;12(2): 143-50).  PATIENT SURVEYS:  Eval:  Quick Sharilyn Sites 40.91 05/03/2023:  Quick DASH 27.27    COGNITION: Overall cognitive status: Within functional limits for tasks assessed  SENSATION: Reports minimal numbness and tingling in right hand, but has improved since before surgery  POSTURE: rounded shoulders  CERVICAL ROM:   Active ROM A/ROM (deg) eval  Flexion 47  Extension 45  Right lateral flexion 32  Left lateral flexion 40  Right rotation 62  Left rotation 65   (Blank rows = not tested)  UPPER EXTREMITY ROM:  Active ROM Right eval Right 04/07/23 Right 04/19/23 Right 05/03/23 Left eval  Shoulder flexion 35 57 60 78 160  Shoulder extension 65 65 65 68 80  Shoulder abduction 55 60 85 88 155  Shoulder adduction       Shoulder internal rotation       Shoulder external rotation        (Blank rows = not tested)  UPPER EXTREMITY MMT:  MMT Right eval Right 04/26/23 Left eval  Shoulder flexion 2 2+ 5  Shoulder extension 3 3+ 5  Shoulder abduction 2 2+ 5  Grip strength 40 lbs 47 lbs 47 lbs   (Blank rows = not tested)   TODAY'S TREATMENT:                                                                                                                               DATE: 05/03/2023  UBE level 1.0 x2 min each direction with PT present to discuss status Nustep level 5 x4 min with PT present to discuss status Measure A/ROM in sitting Supine shoulder flexion 3# AA/ROM with cane 2x10 Supine  chest press 3# with cane 2x10 Supine serratus punch with 3# 2x10 bilat Supine finger tracing of alphabet with arm in 90 deg flexion and 3# A-Z bilat Supine cervical retraction 2x10 Standing UE Ranger for right shoulder flexion 2x10 Bilateral small yellow physioball up wall with 1# on right wrist 2x10   DATE: 04/28/2023  Nustep level 5 x6 min with PT present to discuss status Supine shoulder flexion 3# AA/ROM with cane 2x10 Supine chest press 3# with cane 2x10 Supine serratus punch with 3# 2x10 Supine finger tracing of alphabet with arm in 90 deg flexion and 3# A-Z Left  side-lying right shoulder ER 2x10 Barre push-ups 2x10 Bilateral small yellow physioball up wall with 1# on right wrist 2x10 Standing UE Ranger for right shoulder flexion with 1# on right wrist 2x10 Finger ladder for right shoulder abduction x15   DATE: 04/26/2023  UBE level 1.0 x3 min each direction with PT present to discuss status Standing right finger ladder for shoulder flexion 2x10  Standing UE Ranger for right shoulder flexion 2x10 Bilateral small yellow physioball up wall x15 Supine serratus punch with 3# 2x10 Supine finger tracing of alphabet with arm in 90 deg flexion and 3# A-Z Supine shoulder flexion 3# AA/ROM with cane 2x10 Supine chest press 3# with cane 2x10    PATIENT EDUCATION:  Education details: Issued HEP Person educated: Patient Education method: Explanation, Facilities manager, and Handouts Education comprehension: verbalized understanding and returned demonstration  HOME EXERCISE PROGRAM: Access Code: [email or web address] URL: [email or web address] Date: 04/07/2023 Prepared by: Reather Laurence  Exercises - Seated Shoulder Flexion Towel Slide at Table Top  - 1 x daily - 7 x weekly - 2 sets - 10 reps - Seated Shoulder Abduction Towel Slide at Table Top  - 1 x daily - 7 x weekly - 2 sets - 10 reps - Shoulder Flexion Wall Slide with Towel  - 1 x daily - 7 x weekly - 2 sets - 10 reps - Supine Shoulder Flexion Extension AAROM with Dowel  - 1 x daily - 7 x weekly - 2 sets - 10 reps - Supine Chest Press with Dumbbells  - 1 x daily - 7 x weekly - 2 sets - 10 reps - Supine Shoulder Press AAROM in Abduction with Dowel  - 1 x daily - 7 x weekly - 2 sets - 10 reps - Sidelying Shoulder External Rotation  - 1 x daily - 7 x weekly - 2 sets - 10 reps - Standing Shoulder Row with Anchored Resistance  - 1 x daily - 7 x weekly - 2 sets - 10 reps - Shoulder extension with resistance - Neutral  - 1 x daily - 7 x weekly - 2 sets - 10 reps  ASSESSMENT:  CLINICAL IMPRESSION: Ms  Blong presents to skilled PT with reports of feeling like she has increased range of motion this morning.  Patient is noted to have increased right shoulder A/ROM during session today in standing.  Patient continues to have right AA/ROM that is [facility] during session.  Patient continues to make progress towards goals, but has not yet met goals.  Patient has made improvements with Neldon Mc and has nearly met that goal.  Patient would benefit from additional PT of 2x/week for 8 weeks to continue to progress towards goals and improved right shoulder A/ROM and strength to allow her to complete functional tasks with increased ease.   OBJECTIVE IMPAIRMENTS: decreased ROM, decreased strength, increased muscle spasms, impaired flexibility, postural dysfunction, and pain.  ACTIVITY LIMITATIONS: carrying, lifting, reach over head, and hygiene/grooming  PARTICIPATION LIMITATIONS: meal prep, cleaning, laundry, community activity, and occupation  PERSONAL FACTORS: Fitness and 1 comorbidity: s/p cervical ACDF on 02/21/2023  are also affecting patient's functional outcome.   REHAB POTENTIAL: Good  CLINICAL DECISION MAKING: Stable/uncomplicated  EVALUATION COMPLEXITY: Low   GOALS: Goals reviewed with patient? Yes  SHORT TERM GOALS: Target date: 04/01/2023  Pt will be independent with initial HEP. Baseline:  Goal status: MET  2.  Pt will report at least 25% decrease in symptoms. Baseline:  Goal status: MET   LONG TERM GOALS: Target date: 07/01/2023  Pt will be independent with initial HEP. Baseline:  Goal status: Ongoing  2.  Pt will increase Quick DASH to no greater than 25 to demonstrate her ability to complete functional tasks. Baseline: 40.91 Goal status: Ongoing  3.  Pt will increase right shoulder strength to at least 4/5 to allow her to lift objects with her right hand and use for ADLs/IADLs. Baseline: 2/5 Goal status: Ongoing  4.  Patient will increase her right shoulder A/ROM to at  least 110 degrees flexion and abduction to allow her to reach into overhead cabinets. Baseline: see above Goal status: Ongoing  5.  Patient will increase right grip strength to greater than 50 pounds to allow her to grab and hold items in the home. Baseline: 40 lbs Goal status: Ongoing  6.  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 Baseline:  Goal status: Ongoing   PLAN:  PT FREQUENCY: 2x/week  PT DURATION: 8 weeks  PLANNED INTERVENTIONS: Therapeutic exercises, Therapeutic activity, Neuromuscular re-education, Balance training, Gait training, Patient/Family education, Self Care, Joint mobilization, Joint manipulation, Aquatic Therapy, Dry Needling, Spinal manipulation, Spinal mobilization, Cryotherapy, Moist heat, scar mobilization, Taping, Traction, Ultrasound, Ionotophoresis 4mg /ml Dexamethasone, Manual therapy, and Re-evaluation  PLAN FOR NEXT SESSION: Assess and progress HEP as indicated, strengthening, postural stability   Reather Laurence, PT, DPT 05/03/23, 12:37 PM  [facility] [address], [address] [place], [place] [address] Phone # [phone] Fax [phone]

## 2023-05-05 ENCOUNTER — Encounter: Payer: Self-pay | Admitting: Rehabilitative and Restorative Service Providers"

## 2023-05-05 ENCOUNTER — Ambulatory Visit: Payer: Medicare Other | Admitting: Rehabilitative and Restorative Service Providers"

## 2023-05-05 DIAGNOSIS — R252 Cramp and spasm: Secondary | ICD-10-CM

## 2023-05-05 DIAGNOSIS — M6281 Muscle weakness (generalized): Secondary | ICD-10-CM

## 2023-05-05 DIAGNOSIS — R293 Abnormal posture: Secondary | ICD-10-CM | POA: Diagnosis not present

## 2023-05-05 NOTE — Therapy (Signed)
OUTPATIENT PHYSICAL THERAPY TREATMENT NOTE   Patient Name: Sandra Day MRN: [id number] DOB:[birth year], 77 y.o., female Today's Date: 05/05/2023    END OF SESSION:  PT End of Session - 05/05/23 1104     Visit Number 14    Date for PT Re-Evaluation 07/01/23    Authorization Type BC/BS Medicare    Progress Note Due on Visit 20    PT Start Time 1100    PT Stop Time 1140    PT Time Calculation (min) 40 min    Activity Tolerance Patient tolerated treatment well    Behavior During Therapy [facility] for tasks assessed/performed              Past Medical History:  Diagnosis Date   Anxiety    Arthritis of both knees 03/22/2017   Asthma    as a child, no issues in adulthood   Depression    Fatty liver    GERD (gastroesophageal reflux disease)    Hypertension    Multiple thyroid nodules    Osteoporosis 03/22/2017   Pancytopenia (HCC) 03/22/2017   Past Surgical History:  Procedure Laterality Date   ANTERIOR CERVICAL DECOMP/DISCECTOMY FUSION N/A 02/21/2023   Procedure: Cervical Three-Cervical Four, Cervical Four - Cervical Five, Cervical Five-Cervical Six  Anterior Cervical Discectomy Fusion;  Surgeon: Barnett Abu, MD;  Location: [facility] OR;  Service: Neurosurgery;  Laterality: N/A;  RM 21 3C   KNEE SURGERY Left    Patient Active Problem List   Diagnosis Date Noted   Cervical spondylosis with myelopathy 02/21/2023   Pancytopenia (HCC) 03/22/2017   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   Arthritis of both knees 03/22/2017   Osteoporosis 03/22/2017    PCP: Sigmund Hazel, MD  REFERRING PROVIDER: Barnett Abu, MD  REFERRING DIAG: G95.9 (ICD-10-CM) - Disease of spinal cord, unspecified  THERAPY DIAG:  Muscle weakness (generalized)  Cramp and spasm  Abnormal posture  Rationale for Evaluation and Treatment: Rehabilitation  ONSET DATE: Anterior cervical decompression C3-4 C4-5 C5-6 on 02/21/2023  SUBJECTIVE:                                                                                                                                                                                                          SUBJECTIVE STATEMENT: Pt reports no new complaints.  Hand dominance: Right  PERTINENT HISTORY:  Anterior cervical decompression C3-4 C4-5 C5-6 on 02/21/2023  PAIN:  Are you having pain? Yes: NPRS scale: reports 2/10 Pain location: left shoulder primarily Pain description: sore Aggravating factors: movement Relieving factors: rest  PRECAUTIONS: None  RED  FLAGS: None     WEIGHT BEARING RESTRICTIONS: No  FALLS:  Has patient fallen in last 6 months? 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  NEXT MD VISIT: 2 months with Dr Danielle Dess  OBJECTIVE:   DIAGNOSTIC FINDINGS:  Cervical Radiograph on 02/21/2023: FINDINGS: Cross-table lateral images were obtained for surgical planning purposes. These demonstrate placement of a metallic marker overlying the E4-5 disc space. Second image demonstrates placement of an ACDF with intervertebral spacers at C3-6.  Cervical CT Scan on 03/24/2023: IMPRESSION: 1. Uncomplicated C3-C6 ACDF with bridging bone seen through each cage. 2. No interval adjacent segment degeneration when compared to preoperative MRI. 3. Heterogeneous enlargement the left lobe thyroid. Recommend thyroid ultrasound (ref: J Am Coll Radiol. 2015 Feb;12(2): 143-50).  PATIENT SURVEYS:  Eval:  Quick Sharilyn Sites 40.91 05/03/2023:  Quick DASH 27.27   COGNITION: Overall cognitive status: Within functional limits for tasks assessed  SENSATION: Reports minimal numbness and tingling in right hand, but has improved since  before surgery  POSTURE: rounded shoulders  CERVICAL ROM:   Active ROM A/ROM (deg) eval  Flexion 47  Extension 45  Right lateral flexion 32  Left lateral flexion 40  Right rotation 62  Left rotation 65   (Blank rows = not tested)  UPPER EXTREMITY ROM:  Active ROM Right eval Right 04/07/23 Right 04/19/23 Right 05/03/23 Left eval  Shoulder flexion 35 57 60 78 160  Shoulder extension 65 65 65 68 80  Shoulder abduction 55 60 85 88 155  Shoulder adduction       Shoulder internal rotation       Shoulder external rotation        (Blank rows = not tested)  UPPER EXTREMITY MMT:  MMT Right eval Right 04/26/23 Left eval  Shoulder flexion 2 2+ 5  Shoulder extension 3 3+ 5  Shoulder abduction 2 2+ 5  Grip strength 40 lbs 47 lbs 47 lbs   (Blank rows = not tested)   TODAY'S TREATMENT:                                                                                                                               DATE: 05/03/2023 Nustep level 5 x6 min with PT present to discuss status Supine serratus punch with 3# 2x10 bilat Supine finger tracing of alphabet with arm in 90 deg flexion and 3# A-Z bilat Supine shoulder flexion x10 Supine shoulder flexion 3# AA/ROM with cane 2x10 Supine chest press 3# with cane 2x10 Barre push-up 2x10 Bilateral small yellow physioball up wall with 1# on right wrist 2x10 4D scapular stabilization on wall  with 2# blue ball x20 each direction   DATE: 05/03/2023  UBE level 1.0 x2 min each direction with PT present to discuss status Nustep level 5 x4 min with PT present to discuss status Measure A/ROM in sitting Supine shoulder flexion 3# AA/ROM with cane 2x10 Supine chest press 3# with cane 2x10 Supine serratus punch with 3# 2x10 bilat Supine finger tracing of alphabet with arm in 90 deg flexion and 3# A-Z bilat Supine cervical retraction 2x10 Standing UE Ranger for right shoulder flexion 2x10 Bilateral small yellow physioball up wall with 1# on right  wrist 2x10   DATE: 04/28/2023  Nustep level 5 x6 min with PT present to discuss status Supine shoulder flexion 3# AA/ROM with cane 2x10 Supine chest press 3# with cane 2x10 Supine serratus punch with 3# 2x10 Supine finger tracing of alphabet with arm in 90 deg flexion and 3# A-Z Left side-lying right shoulder ER 2x10 Barre push-ups 2x10 Bilateral small yellow physioball up wall with 1# on right wrist 2x10 Standing UE Ranger for right shoulder flexion with 1# on right wrist 2x10 Finger ladder for right shoulder abduction x15     PATIENT EDUCATION:  Education details: Issued HEP Person educated: Patient Education method: Programmer, multimedia, Facilities manager, and Handouts Education comprehension: verbalized understanding and returned demonstration  HOME EXERCISE PROGRAM: Access Code: [email or web address] URL: [email or web address] Date: 04/07/2023 Prepared by: Reather Laurence  Exercises - Seated Shoulder Flexion Towel Slide at Table Top  - 1 x daily - 7 x weekly - 2 sets - 10 reps - Seated Shoulder Abduction Towel Slide at Table Top  - 1 x daily - 7 x weekly - 2 sets - 10 reps - Shoulder Flexion Wall Slide with Towel  - 1 x daily - 7 x weekly - 2 sets - 10 reps - Supine Shoulder Flexion Extension AAROM with Dowel  - 1 x daily - 7 x weekly - 2 sets - 10 reps - Supine Chest Press with Dumbbells  - 1 x daily - 7 x weekly - 2 sets - 10 reps - Supine Shoulder Press AAROM in Abduction with Dowel  - 1 x daily - 7 x weekly - 2 sets - 10 reps - Sidelying Shoulder External Rotation  - 1 x daily - 7 x weekly - 2 sets - 10 reps - Standing Shoulder Row with Anchored Resistance  - 1 x daily - 7 x weekly - 2 sets - 10 reps - Shoulder extension with resistance - Neutral  - 1 x daily - 7 x weekly - 2 sets - 10 reps  ASSESSMENT:  CLINICAL IMPRESSION: Ms Slavey presents to skilled PT with reports of continued overall progress.  Patient able to complete supine right shoulder flexion A/ROM during session  today.  Patient continues to progress with decreased pain with active range of motion with shoulder motion.  Pt did have some difficulty with maintaining 2# ball on wall during 4D scapular stabilization.   OBJECTIVE IMPAIRMENTS: decreased ROM, decreased strength, increased muscle spasms, impaired flexibility, postural dysfunction, and pain.   ACTIVITY LIMITATIONS: carrying, lifting, reach over head, and hygiene/grooming  PARTICIPATION LIMITATIONS: meal prep, cleaning, laundry, community activity, and occupation  PERSONAL FACTORS: Fitness and 1 comorbidity: s/p cervical ACDF on 02/21/2023  are also affecting patient's functional outcome.   REHAB POTENTIAL: Good  CLINICAL DECISION MAKING: Stable/uncomplicated  EVALUATION COMPLEXITY: Low   GOALS: Goals reviewed with patient? Yes  SHORT TERM GOALS: Target date: 04/01/2023  Pt will be independent with initial HEP. Baseline:  Goal status: MET  2.  Pt will report at least 25% decrease in symptoms. Baseline:  Goal status: MET   LONG TERM GOALS: Target date: 07/01/2023  Pt will be independent with initial HEP. Baseline:  Goal status: Ongoing  2.  Pt will increase Quick DASH to no greater than 25 to demonstrate her ability to complete functional tasks. Baseline: 40.91 Goal status: Ongoing  3.  Pt will increase right shoulder strength to at least 4/5 to allow her to lift objects with her right hand and use for ADLs/IADLs. Baseline: 2/5 Goal status: Ongoing  4.  Patient will increase her right shoulder A/ROM to at least 110 degrees flexion and abduction to allow her to reach into overhead cabinets. Baseline: see above Goal status: Ongoing  5.  Patient will increase right grip strength to greater than 50 pounds to allow her to grab and hold items in the home. Baseline: 40 lbs Goal status: Ongoing  6.  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 Baseline:   Goal status: Ongoing   PLAN:  PT FREQUENCY: 2x/week  PT DURATION: 8 weeks  PLANNED INTERVENTIONS: Therapeutic exercises, Therapeutic activity, Neuromuscular re-education, Balance training, Gait training, Patient/Family education, Self Care, Joint mobilization, Joint manipulation, Aquatic Therapy, Dry Needling, Spinal manipulation, Spinal mobilization, Cryotherapy, Moist heat, scar mobilization, Taping, Traction, Ultrasound, Ionotophoresis 4mg /ml Dexamethasone, Manual therapy, and Re-evaluation  PLAN FOR NEXT SESSION: Assess and progress HEP as indicated, strengthening, postural stability   Reather Laurence, PT, DPT 05/05/23, 11:48 AM  [facility] [address] [place], [place] [address] Phone # [phone] Fax [phone]

## 2023-05-11 ENCOUNTER — Ambulatory Visit: Payer: Medicare Other | Admitting: Rehabilitative and Restorative Service Providers"

## 2023-05-11 ENCOUNTER — Encounter: Payer: Self-pay | Admitting: Rehabilitative and Restorative Service Providers"

## 2023-05-11 DIAGNOSIS — R293 Abnormal posture: Secondary | ICD-10-CM

## 2023-05-11 DIAGNOSIS — R252 Cramp and spasm: Secondary | ICD-10-CM

## 2023-05-11 DIAGNOSIS — M6281 Muscle weakness (generalized): Secondary | ICD-10-CM | POA: Diagnosis not present

## 2023-05-11 NOTE — Therapy (Signed)
OUTPATIENT PHYSICAL THERAPY TREATMENT NOTE   Patient Name: Sandra Day MRN: [id number] DOB:[birth year], 77 y.o., female Today's Date: 05/11/2023    END OF SESSION:  PT End of Session - 05/11/23 1103     Visit Number 15    Date for PT Re-Evaluation 07/01/23    Authorization Type BC/BS Medicare    Progress Note Due on Visit 20    PT Start Time 1100    PT Stop Time 1140    PT Time Calculation (min) 40 min    Activity Tolerance Patient tolerated treatment well    Behavior During Therapy [facility] for tasks assessed/performed              Past Medical History:  Diagnosis Date   Anxiety    Arthritis of both knees 03/22/2017   Asthma    as a child, no issues in adulthood   Depression    Fatty liver    GERD (gastroesophageal reflux disease)    Hypertension    Multiple thyroid nodules    Osteoporosis 03/22/2017   Pancytopenia (HCC) 03/22/2017   Past Surgical History:  Procedure Laterality Date   ANTERIOR CERVICAL DECOMP/DISCECTOMY FUSION N/A 02/21/2023   Procedure: Cervical Three-Cervical Four, Cervical Four - Cervical Five, Cervical Five-Cervical Six  Anterior Cervical Discectomy Fusion;  Surgeon: Barnett Abu, MD;  Location: [facility] OR;  Service: Neurosurgery;  Laterality: N/A;  RM 21 3C   KNEE SURGERY Left    Patient Active Problem List   Diagnosis Date Noted   Cervical spondylosis with myelopathy 02/21/2023   Pancytopenia (HCC) 03/22/2017   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   Arthritis of both knees 03/22/2017   Osteoporosis 03/22/2017    PCP: Sigmund Hazel, MD  REFERRING PROVIDER: Barnett Abu, MD  REFERRING DIAG: G95.9 (ICD-10-CM) - Disease of spinal cord, unspecified  THERAPY DIAG:  Muscle weakness (generalized)  Cramp and spasm  Abnormal posture  Rationale for Evaluation and Treatment: Rehabilitation  ONSET DATE: Anterior cervical decompression C3-4 C4-5 C5-6 on 02/21/2023  SUBJECTIVE:                                                                                                                                                                                                          SUBJECTIVE STATEMENT: Pt reports no new complaints.  States that she was able to assist at an adoption fair over the weekend.  Hand dominance: Right  PERTINENT HISTORY:  Anterior cervical decompression C3-4 C4-5 C5-6 on 02/21/2023  PAIN:  Are you having pain? Yes: NPRS scale: reports 1-2/10 Pain location: left shoulder  primarily Pain description: sore Aggravating factors: movement Relieving factors: rest  PRECAUTIONS: None  RED FLAGS: None     WEIGHT BEARING RESTRICTIONS: No  FALLS:  Has patient fallen in last 6 months? 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  NEXT MD VISIT: 2 months with Dr Danielle Dess  OBJECTIVE:   DIAGNOSTIC FINDINGS:  Cervical Radiograph on 02/21/2023: FINDINGS: Cross-table lateral images were obtained for surgical planning purposes. These demonstrate placement of a metallic marker overlying the H3-7 disc space. Second image demonstrates placement of an ACDF with intervertebral spacers at C3-6.  Cervical CT Scan on 03/24/2023: IMPRESSION: 1. Uncomplicated C3-C6 ACDF with bridging bone seen through each cage. 2. No interval adjacent segment degeneration when compared to preoperative MRI. 3. Heterogeneous enlargement the left lobe thyroid. Recommend thyroid ultrasound (ref: J Am Coll Radiol. 2015 Feb;12(2): 143-50).  PATIENT SURVEYS:  Eval:  Quick Sharilyn Sites 40.91 05/03/2023:  Quick DASH 27.27   COGNITION: Overall cognitive status: Within functional limits for tasks  assessed  SENSATION: Reports minimal numbness and tingling in right hand, but has improved since before surgery  POSTURE: rounded shoulders  CERVICAL ROM:   Active ROM A/ROM (deg) eval  Flexion 47  Extension 45  Right lateral flexion 32  Left lateral flexion 40  Right rotation 62  Left rotation 65   (Blank rows = not tested)  UPPER EXTREMITY ROM:  Active ROM Right eval Right 04/07/23 Right 04/19/23 Right 05/03/23 Left eval  Shoulder flexion 35 57 60 78 160  Shoulder extension 65 65 65 68 80  Shoulder abduction 55 60 85 88 155  Shoulder adduction       Shoulder internal rotation       Shoulder external rotation        (Blank rows = not tested)  UPPER EXTREMITY MMT:  MMT Right eval Right 04/26/23 Left eval  Shoulder flexion 2 2+ 5  Shoulder extension 3 3+ 5  Shoulder abduction 2 2+ 5  Grip strength 40 lbs 47 lbs 47 lbs   (Blank rows = not tested)   TODAY'S TREATMENT:                                                                                                                               DATE: 05/11/2023 Nustep level 5 x6 min with PT present to discuss status Supine serratus punch with 3# 2x10 bilat Supine finger tracing of alphabet with arm in 90 deg flexion and 3# A-Z bilat Supine shoulder flexion 4# AA/ROM with cane 2x10 Supine chest press 4# with cane 2x10 Standing 3 way scapular retraction with green loop x5  bilat Bilateral small yellow physioball up wall with 1# on right wrist 2x10 Standing UE Ranger for right shoulder flexion with 1# on right wrist 2x10 Barre push-up 2x10 Supine shoulder flexion with 1# wrist weight 2x10  Left side-lying right shoulder abduction 2x10   DATE: 05/05/2023 Nustep level 5 x6 min with PT present to discuss status Supine serratus punch with 3# 2x10 bilat Supine finger tracing of alphabet with arm in 90 deg flexion and 3# A-Z bilat Supine shoulder flexion x10 Supine shoulder flexion 3# AA/ROM with cane 2x10 Supine chest  press 3# with cane 2x10 Barre push-up 2x10 Bilateral small yellow physioball up wall with 1# on right wrist 2x10 4D scapular stabilization on wall with 2# blue ball x20 each direction   DATE: 05/03/2023  UBE level 1.0 x2 min each direction with PT present to discuss status Nustep level 5 x4 min with PT present to discuss status Measure A/ROM in sitting Supine shoulder flexion 3# AA/ROM with cane 2x10 Supine chest press 3# with cane 2x10 Supine serratus punch with 3# 2x10 bilat Supine finger tracing of alphabet with arm in 90 deg flexion and 3# A-Z bilat Supine cervical retraction 2x10 Standing UE Ranger for right shoulder flexion 2x10 Bilateral small yellow physioball up wall with 1# on right wrist 2x10    PATIENT EDUCATION:  Education details: Issued HEP Person educated: Patient Education method: Explanation, Facilities manager, and Handouts Education comprehension: verbalized understanding and returned demonstration  HOME EXERCISE PROGRAM: Access Code: [email or web address] URL: [email or web address] Date: 04/07/2023 Prepared by: Reather Laurence  Exercises - Seated Shoulder Flexion Towel Slide at Table Top  - 1 x daily - 7 x weekly - 2 sets - 10 reps - Seated Shoulder Abduction Towel Slide at Table Top  - 1 x daily - 7 x weekly - 2 sets - 10 reps - Shoulder Flexion Wall Slide with Towel  - 1 x daily - 7 x weekly - 2 sets - 10 reps - Supine Shoulder Flexion Extension AAROM with Dowel  - 1 x daily - 7 x weekly - 2 sets - 10 reps - Supine Chest Press with Dumbbells  - 1 x daily - 7 x weekly - 2 sets - 10 reps - Supine Shoulder Press AAROM in Abduction with Dowel  - 1 x daily - 7 x weekly - 2 sets - 10 reps - Sidelying Shoulder External Rotation  - 1 x daily - 7 x weekly - 2 sets - 10 reps - Standing Shoulder Row with Anchored Resistance  - 1 x daily - 7 x weekly - 2 sets - 10 reps - Shoulder extension with resistance - Neutral  - 1 x daily - 7 x weekly - 2 sets - 10  reps  ASSESSMENT:  CLINICAL IMPRESSION: Ms Renew presents to skilled PT with reports of feeling stronger each day.  Patient is progressing with increased strength and able to increased weight today.  Patient able to complete right shoulder sidelying abduction during session today with less compensation and less overall pain.  Patient with great participation and motivation throughout visit and requires less cuing for technique.  Patient with difficulty with standing 3 way scapular stabilization on wall with green loop, so only 5 completed.  Patient continues to require skilled PT to progress towards goal related activities.   OBJECTIVE IMPAIRMENTS: decreased ROM, decreased strength, increased muscle spasms, impaired flexibility, postural dysfunction, and pain.   ACTIVITY LIMITATIONS: carrying, lifting, reach over head, and hygiene/grooming  PARTICIPATION LIMITATIONS: meal prep, cleaning, laundry,  community activity, and occupation  PERSONAL FACTORS: Fitness and 1 comorbidity: s/p cervical ACDF on 02/21/2023  are also affecting patient's functional outcome.   REHAB POTENTIAL: Good  CLINICAL DECISION MAKING: Stable/uncomplicated  EVALUATION COMPLEXITY: Low   GOALS: Goals reviewed with patient? Yes  SHORT TERM GOALS: Target date: 04/01/2023  Pt will be independent with initial HEP. Baseline:  Goal status: MET  2.  Pt will report at least 25% decrease in symptoms. Baseline:  Goal status: MET   LONG TERM GOALS: Target date: 07/01/2023  Pt will be independent with initial HEP. Baseline:  Goal status: Ongoing  2.  Pt will increase Quick DASH to no greater than 25 to demonstrate her ability to complete functional tasks. Baseline: 40.91 Goal status: Ongoing  3.  Pt will increase right shoulder strength to at least 4/5 to allow her to lift objects with her right hand and use for ADLs/IADLs. Baseline: 2/5 Goal status: Ongoing  4.  Patient will increase her right shoulder A/ROM to  at least 110 degrees flexion and abduction to allow her to reach into overhead cabinets. Baseline: see above Goal status: Ongoing  5.  Patient will increase right grip strength to greater than 50 pounds to allow her to grab and hold items in the home. Baseline: 40 lbs Goal status: Ongoing  6.  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 Baseline:  Goal status: Ongoing   PLAN:  PT FREQUENCY: 2x/week  PT DURATION: 8 weeks  PLANNED INTERVENTIONS: Therapeutic exercises, Therapeutic activity, Neuromuscular re-education, Balance training, Gait training, Patient/Family education, Self Care, Joint mobilization, Joint manipulation, Aquatic Therapy, Dry Needling, Spinal manipulation, Spinal mobilization, Cryotherapy, Moist heat, scar mobilization, Taping, Traction, Ultrasound, Ionotophoresis 4mg /ml Dexamethasone, Manual therapy, and Re-evaluation  PLAN FOR NEXT SESSION: Assess and progress HEP as indicated, strengthening, postural stability   Reather Laurence, PT, DPT 05/11/23, 11:48 AM  [facility] [address] [place], [place] [address] Phone # [phone] Fax [phone]

## 2023-05-13 ENCOUNTER — Ambulatory Visit: Payer: Medicare Other | Admitting: Rehabilitative and Restorative Service Providers"

## 2023-05-13 ENCOUNTER — Encounter: Payer: Self-pay | Admitting: Rehabilitative and Restorative Service Providers"

## 2023-05-13 DIAGNOSIS — R252 Cramp and spasm: Secondary | ICD-10-CM | POA: Diagnosis not present

## 2023-05-13 DIAGNOSIS — M6281 Muscle weakness (generalized): Secondary | ICD-10-CM

## 2023-05-13 DIAGNOSIS — R293 Abnormal posture: Secondary | ICD-10-CM

## 2023-05-13 NOTE — Therapy (Signed)
OUTPATIENT PHYSICAL THERAPY TREATMENT NOTE   Patient Name: Sandra Day MRN: [id number] DOB:August 10, 1946, 77 y.o., female Today's Date: 05/13/2023    END OF SESSION:  PT End of Session - 05/13/23 1145     Visit Number 16    Date for PT Re-Evaluation 07/01/23    Authorization Type BC/BS Medicare    Progress Note Due on Visit 20    PT Start Time 1100    PT Stop Time 1140    PT Time Calculation (min) 40 min    Activity Tolerance Patient tolerated treatment well    Behavior During Therapy [facility] for tasks assessed/performed              Past Medical History:  Diagnosis Date   Anxiety    Arthritis of both knees 03/22/2017   Asthma    as a child, no issues in adulthood   Depression    Fatty liver    GERD (gastroesophageal reflux disease)    Hypertension    Multiple thyroid nodules    Osteoporosis 03/22/2017   Pancytopenia (HCC) 03/22/2017   Past Surgical History:  Procedure Laterality Date   ANTERIOR CERVICAL DECOMP/DISCECTOMY FUSION N/A 02/21/2023   Procedure: Cervical Three-Cervical Four, Cervical Four - Cervical Five, Cervical Five-Cervical Six  Anterior Cervical Discectomy Fusion;  Surgeon: Barnett Abu, MD;  Location: [facility] OR;  Service: Neurosurgery;  Laterality: N/A;  RM 21 3C   KNEE SURGERY Left    Patient Active Problem List   Diagnosis Date Noted   Cervical spondylosis with myelopathy 02/21/2023   Pancytopenia (HCC) 03/22/2017   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   Arthritis of both knees 03/22/2017   Osteoporosis 03/22/2017    PCP: Sigmund Hazel, MD  REFERRING PROVIDER: Barnett Abu, MD  REFERRING DIAG: G95.9 (ICD-10-CM) - Disease of spinal cord, unspecified  THERAPY DIAG:  Muscle weakness (generalized)  Cramp and spasm  Abnormal posture  Rationale for Evaluation and Treatment: Rehabilitation  ONSET DATE: Anterior cervical decompression C3-4 C4-5 C5-6 on 02/21/2023  SUBJECTIVE:                                                                                                                                                                                                          SUBJECTIVE STATEMENT: Pt reports she is doing good today. She is having 2/10 pain today in her Lt shoulder.  Hand dominance: Right  PERTINENT HISTORY:  Anterior cervical decompression C3-4 C4-5 C5-6 on 02/21/2023  PAIN:  Are you having pain? Yes: NPRS scale: reports 2/10 Pain location: left shoulder primarily Pain description:  sore Aggravating factors: movement Relieving factors: rest  PRECAUTIONS: None  RED FLAGS: None     WEIGHT BEARING RESTRICTIONS: No  FALLS:  Has patient fallen in last 6 months? 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  NEXT MD VISIT: 2 months with Dr Danielle Dess  OBJECTIVE:   DIAGNOSTIC FINDINGS:  Cervical Radiograph on 02/21/2023: FINDINGS: Cross-table lateral images were obtained for surgical planning purposes. These demonstrate placement of a metallic marker overlying the W0-9 disc space. Second image demonstrates placement of an ACDF with intervertebral spacers at C3-6.  Cervical CT Scan on 03/24/2023: IMPRESSION: 1. Uncomplicated C3-C6 ACDF with bridging bone seen through each cage. 2. No interval adjacent segment degeneration when compared to preoperative MRI. 3. Heterogeneous enlargement the left lobe thyroid. Recommend thyroid ultrasound (ref: J Am Coll Radiol. 2015 Feb;12(2): 143-50).  PATIENT SURVEYS:  Eval:  Quick Sharilyn Sites 40.91 05/03/2023:  Quick DASH 27.27   COGNITION: Overall cognitive status: Within functional limits for tasks assessed  SENSATION: Reports minimal  numbness and tingling in right hand, but has improved since before surgery  POSTURE: rounded shoulders  CERVICAL ROM:   Active ROM A/ROM (deg) eval  Flexion 47  Extension 45  Right lateral flexion 32  Left lateral flexion 40  Right rotation 62  Left rotation 65   (Blank rows = not tested)  UPPER EXTREMITY ROM:  Active ROM Right eval Right 04/07/23 Right 04/19/23 Right 05/03/23 Left eval  Shoulder flexion 35 57 60 78 160  Shoulder extension 65 65 65 68 80  Shoulder abduction 55 60 85 88 155  Shoulder adduction       Shoulder internal rotation       Shoulder external rotation        (Blank rows = not tested)  UPPER EXTREMITY MMT:  MMT Right eval Right 04/26/23 Left eval  Shoulder flexion 2 2+ 5  Shoulder extension 3 3+ 5  Shoulder abduction 2 2+ 5  Grip strength 40 lbs 47 lbs 47 lbs   (Blank rows = not tested)   TODAY'S TREATMENT:                                                                                                                               DATE: 05/13/2023 Nustep level 5 x6 min with PT present to discuss status AAROM Shoulder Abduction and flexion with towel on wall 2 x 10 each - abduction more difficult  Standing Forearm 3 way scapular retraction with green loop x5 bilat Wall push-up 2x10 Supine shoulder flexion 4# AA/ROM with dowel 2x10 Supine chest press 4# with cane 2x10 Supine serratus  punch with 3# 2x10 bilat Supine shoulder flexion x 10 Lt Side lying shoulder abduction 2 x 10 Supine finger tracing of alphabet with arm in 90 deg flexion and 3# A-Z bilat   DATE: 05/11/2023 Nustep level 5 x6 min with PT present to discuss status Supine serratus punch with 3# 2x10 bilat Supine finger tracing of alphabet with arm in 90 deg flexion and 3# A-Z bilat Supine shoulder flexion 4# AA/ROM with cane 2x10 Supine chest press 4# with cane 2x10 Standing 3 way scapular retraction with green loop x5 bilat Bilateral small yellow physioball up wall with 1# on  right wrist 2x10 Standing UE Ranger for right shoulder flexion with 1# on right wrist 2x10 Barre push-up 2x10 Supine shoulder flexion with 1# wrist weight 2x10  Left side-lying right shoulder abduction 2x10   DATE: 05/05/2023 Nustep level 5 x6 min with PT present to discuss status Supine serratus punch with 3# 2x10 bilat Supine finger tracing of alphabet with arm in 90 deg flexion and 3# A-Z bilat Supine shoulder flexion x10 Supine shoulder flexion 3# AA/ROM with cane 2x10 Supine chest press 3# with cane 2x10 Barre push-up 2x10 Bilateral small yellow physioball up wall with 1# on right wrist 2x10 4D scapular stabilization on wall with 2# blue ball x20 each direction    PATIENT EDUCATION:  Education details: Issued HEP Person educated: Patient Education method: Explanation, Demonstration, and Handouts Education comprehension: verbalized understanding and returned demonstration  HOME EXERCISE PROGRAM: Access Code: [email or web address] URL: [email or web address] Date: 04/07/2023 Prepared by: Reather Laurence  Exercises - Seated Shoulder Flexion Towel Slide at Table Top  - 1 x daily - 7 x weekly - 2 sets - 10 reps - Seated Shoulder Abduction Towel Slide at Table Top  - 1 x daily - 7 x weekly - 2 sets - 10 reps - Shoulder Flexion Wall Slide with Towel  - 1 x daily - 7 x weekly - 2 sets - 10 reps - Supine Shoulder Flexion Extension AAROM with Dowel  - 1 x daily - 7 x weekly - 2 sets - 10 reps - Supine Chest Press with Dumbbells  - 1 x daily - 7 x weekly - 2 sets - 10 reps - Supine Shoulder Press AAROM in Abduction with Dowel  - 1 x daily - 7 x weekly - 2 sets - 10 reps - Sidelying Shoulder External Rotation  - 1 x daily - 7 x weekly - 2 sets - 10 reps - Standing Shoulder Row with Anchored Resistance  - 1 x daily - 7 x weekly - 2 sets - 10 reps - Shoulder extension with resistance - Neutral  - 1 x daily - 7 x weekly - 2 sets - 10 reps  ASSESSMENT:  CLINICAL IMPRESSION: Today's  treatment session focused on shoulder stability and ROM. Incorporated shoulder abduction and flexion towel slides against wall for improved ROM. Pt required verbal and tactile cues to decrease upper trap activation while performing exercises. As patient fatigued noted increased compensations and PT provided verbal and tactile cues. Patient will continue to benefit from skilled therapy to address current impairments and functional limitations.   OBJECTIVE IMPAIRMENTS: decreased ROM, decreased strength, increased muscle spasms, impaired flexibility, postural dysfunction, and pain.   ACTIVITY LIMITATIONS: carrying, lifting, reach over head, and hygiene/grooming  PARTICIPATION LIMITATIONS: meal prep, cleaning, laundry, community activity, and occupation  PERSONAL FACTORS: Fitness and 1 comorbidity: s/p cervical ACDF on 02/21/2023  are also affecting patient's functional outcome.   REHAB POTENTIAL: Good  CLINICAL DECISION MAKING: Stable/uncomplicated  EVALUATION COMPLEXITY: Low   GOALS: Goals reviewed with patient? Yes  SHORT TERM GOALS: Target date: 04/01/2023  Pt will be independent with initial HEP. Baseline:  Goal status: MET  2.  Pt will report at least 25% decrease in symptoms. Baseline:  Goal status: MET   LONG TERM GOALS: Target date: 07/01/2023  Pt will be independent with initial HEP. Baseline:  Goal status: Ongoing  2.  Pt will increase Quick DASH to no greater than 25 to demonstrate her ability to complete functional tasks. Baseline: 40.91 Goal status: Ongoing (see above)  3.  Pt will increase right shoulder strength to at least 4/5 to allow her to lift objects with her right hand and use for ADLs/IADLs. Baseline: 2/5 Goal status: Ongoing (see above)  4.  Patient will increase her right shoulder A/ROM to at least 110 degrees flexion and abduction to allow her to reach into overhead cabinets. Baseline: see above Goal status: Ongoing  5.  Patient will increase right  grip strength to greater than 50 pounds to allow her to grab and hold items in the home. Baseline: 40 lbs Goal status: Ongoing  6.  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 Baseline:  Goal status: Ongoing   PLAN:  PT FREQUENCY: 2x/week  PT DURATION: 8 weeks  PLANNED INTERVENTIONS: Therapeutic exercises, Therapeutic activity, Neuromuscular re-education, Balance training, Gait training, Patient/Family education, Self Care, Joint mobilization, Joint manipulation, Aquatic Therapy, Dry Needling, Spinal manipulation, Spinal mobilization, Cryotherapy, Moist heat, scar mobilization, Taping, Traction, Ultrasound, Ionotophoresis 4mg /ml Dexamethasone, Manual therapy, and Re-evaluation  PLAN FOR NEXT SESSION: Shoulder stability, periscapular strengthening   Claude Manges, PT, DPT Reather Laurence, PT, DPT 05/13/23, 11:47 AM  [facility] [address] [place], [place] [address] Phone # [phone] Fax [phone]

## 2023-05-19 ENCOUNTER — Ambulatory Visit: Payer: Medicare Other | Admitting: Rehabilitative and Restorative Service Providers"

## 2023-05-19 ENCOUNTER — Encounter: Payer: Self-pay | Admitting: Rehabilitative and Restorative Service Providers"

## 2023-05-19 DIAGNOSIS — R293 Abnormal posture: Secondary | ICD-10-CM

## 2023-05-19 DIAGNOSIS — M6281 Muscle weakness (generalized): Secondary | ICD-10-CM | POA: Diagnosis not present

## 2023-05-19 DIAGNOSIS — R252 Cramp and spasm: Secondary | ICD-10-CM

## 2023-05-19 NOTE — Therapy (Signed)
OUTPATIENT PHYSICAL THERAPY TREATMENT NOTE   Patient Name: Sandra Day MRN: [id number] DOB:24-May-1946, 77 y.o., female Today's Date: 05/19/2023    END OF SESSION:  PT End of Session - 05/19/23 1022     Visit Number 17    Date for PT Re-Evaluation 07/01/23    Authorization Type BC/BS Medicare    Progress Note Due on Visit 20    PT Start Time 1015    PT Stop Time 1055    PT Time Calculation (min) 40 min    Activity Tolerance Patient tolerated treatment well    Behavior During Therapy [facility] for tasks assessed/performed              Past Medical History:  Diagnosis Date   Anxiety    Arthritis of both knees 03/22/2017   Asthma    as a child, no issues in adulthood   Depression    Fatty liver    GERD (gastroesophageal reflux disease)    Hypertension    Multiple thyroid nodules    Osteoporosis 03/22/2017   Pancytopenia (HCC) 03/22/2017   Past Surgical History:  Procedure Laterality Date   ANTERIOR CERVICAL DECOMP/DISCECTOMY FUSION N/A 02/21/2023   Procedure: Cervical Three-Cervical Four, Cervical Four - Cervical Five, Cervical Five-Cervical Six  Anterior Cervical Discectomy Fusion;  Surgeon: Barnett Abu, MD;  Location: [facility] OR;  Service: Neurosurgery;  Laterality: N/A;  RM 21 3C   KNEE SURGERY Left    Patient Active Problem List   Diagnosis Date Noted   Cervical spondylosis with myelopathy 02/21/2023   Pancytopenia (HCC) 03/22/2017   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   Arthritis of both knees 03/22/2017   Osteoporosis 03/22/2017    PCP: Sigmund Hazel, MD  REFERRING PROVIDER: Barnett Abu, MD  REFERRING DIAG: G95.9 (ICD-10-CM) - Disease of spinal cord, unspecified  THERAPY DIAG:  Muscle weakness (generalized)  Cramp and spasm  Abnormal posture  Rationale for Evaluation and Treatment: Rehabilitation  ONSET DATE: Anterior cervical decompression C3-4 C4-5 C5-6 on 02/21/2023  SUBJECTIVE:                                                                                                                                                                                                          SUBJECTIVE STATEMENT: Pt reports she has been feeling some tightness in her cervical/shoulder region.  Hand dominance: Right  PERTINENT HISTORY:  Anterior cervical decompression C3-4 C4-5 C5-6 on 02/21/2023  PAIN:  Are you having pain? Yes: NPRS scale: 3/10 Pain location: left shoulder primarily Pain description: sore Aggravating factors: movement Relieving factors:  rest  PRECAUTIONS: None  RED FLAGS: None     WEIGHT BEARING RESTRICTIONS: No  FALLS:  Has patient fallen in last 6 months? 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  NEXT MD VISIT: 2 months with Dr Danielle Dess  OBJECTIVE:   DIAGNOSTIC FINDINGS:  Cervical Radiograph on 02/21/2023: FINDINGS: Cross-table lateral images were obtained for surgical planning purposes. These demonstrate placement of a metallic marker overlying the Y8-6 disc space. Second image demonstrates placement of an ACDF with intervertebral spacers at C3-6.  Cervical CT Scan on 03/24/2023: IMPRESSION: 1. Uncomplicated C3-C6 ACDF with bridging bone seen through each cage. 2. No interval adjacent segment degeneration when compared to preoperative MRI. 3. Heterogeneous enlargement the left lobe thyroid. Recommend thyroid ultrasound (ref: J Am Coll Radiol. 2015 Feb;12(2): 143-50).  PATIENT SURVEYS:  Eval:  Quick Sharilyn Sites 40.91 05/03/2023:  Quick DASH 27.27   COGNITION: Overall cognitive status: Within functional limits for tasks assessed  SENSATION: Reports minimal numbness and  tingling in right hand, but has improved since before surgery  POSTURE: rounded shoulders  CERVICAL ROM:   Active ROM A/ROM (deg) eval  Flexion 47  Extension 45  Right lateral flexion 32  Left lateral flexion 40  Right rotation 62  Left rotation 65   (Blank rows = not tested)  UPPER EXTREMITY ROM:  Active ROM Right eval Right 04/07/23 Right 04/19/23 Right 05/03/23 Left eval  Shoulder flexion 35 57 60 78 160  Shoulder extension 65 65 65 68 80  Shoulder abduction 55 60 85 88 155  Shoulder adduction       Shoulder internal rotation       Shoulder external rotation        (Blank rows = not tested)  UPPER EXTREMITY MMT:  MMT Right eval Right 04/26/23 Left eval  Shoulder flexion 2 2+ 5  Shoulder extension 3 3+ 5  Shoulder abduction 2 2+ 5  Grip strength 40 lbs 47 lbs 47 lbs   (Blank rows = not tested)   TODAY'S TREATMENT:                                                                                                                               DATE: 05/19/2023 Nustep level 5 x6 min with PT present to discuss status Standing facing wall performing wall CW and CCW circles with hand on towel 2x10 each Standing Forearm 3 way scapular retraction with green loop x5 bilat Wall push-up 2x10 Bilateral small yellow physioball up wall with 1# on right wrist 2x10 Standing UE Ranger for right shoulder flexion and abduction with 1# on right wrist  2x10 each Supine shoulder flexion 4# AA/ROM with dowel 2x10 Supine chest press 4# with cane 2x10 Supine serratus punch with 3# 2x10 bilat    DATE: 05/13/2023 Nustep level 5 x6 min with PT present to discuss status AAROM Shoulder Abduction and flexion with towel on wall 2 x 10 each - abduction more difficult  Standing Forearm 3 way scapular retraction with green loop x5 bilat Wall push-up 2x10 Supine shoulder flexion 4# AA/ROM with dowel 2x10 Supine chest press 4# with cane 2x10 Supine serratus punch with 3# 2x10 bilat Supine  shoulder flexion x 10 Lt Side lying shoulder abduction 2 x 10 Supine finger tracing of alphabet with arm in 90 deg flexion and 3# A-Z bilat   DATE: 05/11/2023 Nustep level 5 x6 min with PT present to discuss status Supine serratus punch with 3# 2x10 bilat Supine finger tracing of alphabet with arm in 90 deg flexion and 3# A-Z bilat Supine shoulder flexion 4# AA/ROM with cane 2x10 Supine chest press 4# with cane 2x10 Standing 3 way scapular retraction with green loop x5 bilat Bilateral small yellow physioball up wall with 1# on right wrist 2x10 Standing UE Ranger for right shoulder flexion with 1# on right wrist 2x10 Barre push-up 2x10 Supine shoulder flexion with 1# wrist weight 2x10  Left side-lying right shoulder abduction 2x10    PATIENT EDUCATION:  Education details: Issued HEP Person educated: Patient Education method: Programmer, multimedia, Facilities manager, and Handouts Education comprehension: verbalized understanding and returned demonstration  HOME EXERCISE PROGRAM: Access Code: [email or web address] URL: [email or web address] Date: 04/07/2023 Prepared by: Reather Laurence  Exercises - Seated Shoulder Flexion Towel Slide at Table Top  - 1 x daily - 7 x weekly - 2 sets - 10 reps - Seated Shoulder Abduction Towel Slide at Table Top  - 1 x daily - 7 x weekly - 2 sets - 10 reps - Shoulder Flexion Wall Slide with Towel  - 1 x daily - 7 x weekly - 2 sets - 10 reps - Supine Shoulder Flexion Extension AAROM with Dowel  - 1 x daily - 7 x weekly - 2 sets - 10 reps - Supine Chest Press with Dumbbells  - 1 x daily - 7 x weekly - 2 sets - 10 reps - Supine Shoulder Press AAROM in Abduction with Dowel  - 1 x daily - 7 x weekly - 2 sets - 10 reps - Sidelying Shoulder External Rotation  - 1 x daily - 7 x weekly - 2 sets - 10 reps - Standing Shoulder Row with Anchored Resistance  - 1 x daily - 7 x weekly - 2 sets - 10 reps - Shoulder extension with resistance - Neutral  - 1 x daily - 7 x weekly - 2  sets - 10 reps  ASSESSMENT:  CLINICAL IMPRESSION: Ms Lango presents to skilled PT reporting that she is feeling 70% better since starting skilled PT.  Patient states that she is able to use her shoulder more, but is still having difficulty with tasks like taking pans out of her oven.  Patient reports that when she is washing her hair, she is able to use her right shoulder more.  Patient is making progress with increased strengthening and improved A/ROM in right shoulder.  OBJECTIVE IMPAIRMENTS: decreased ROM, decreased strength, increased muscle spasms, impaired flexibility, postural dysfunction, and pain.   ACTIVITY LIMITATIONS: carrying, lifting, reach over head, and hygiene/grooming  PARTICIPATION LIMITATIONS: meal prep, cleaning, laundry, community activity, and occupation  PERSONAL FACTORS: Fitness and 1 comorbidity:  s/p cervical ACDF on 02/21/2023  are also affecting patient's functional outcome.   REHAB POTENTIAL: Good  CLINICAL DECISION MAKING: Stable/uncomplicated  EVALUATION COMPLEXITY: Low   GOALS: Goals reviewed with patient? Yes  SHORT TERM GOALS: Target date: 04/01/2023  Pt will be independent with initial HEP. Baseline:  Goal status: MET  2.  Pt will report at least 25% decrease in symptoms. Baseline:  Goal status: MET   LONG TERM GOALS: Target date: 07/01/2023  Pt will be independent with initial HEP. Baseline:  Goal status: Ongoing  2.  Pt will increase Quick DASH to no greater than 25 to demonstrate her ability to complete functional tasks. Baseline: 40.91 Goal status: Ongoing (see above)  3.  Pt will increase right shoulder strength to at least 4/5 to allow her to lift objects with her right hand and use for ADLs/IADLs. Baseline: 2/5 Goal status: Ongoing (see above)  4.  Patient will increase her right shoulder A/ROM to at least 110 degrees flexion and abduction to allow her to reach into overhead cabinets. Baseline: see above Goal status:  Ongoing  5.  Patient will increase right grip strength to greater than 50 pounds to allow her to grab and hold items in the home. Baseline: 40 lbs Goal status: Ongoing  6.  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 Baseline:  Goal status: Ongoing   PLAN:  PT FREQUENCY: 2x/week  PT DURATION: 8 weeks  PLANNED INTERVENTIONS: Therapeutic exercises, Therapeutic activity, Neuromuscular re-education, Balance training, Gait training, Patient/Family education, Self Care, Joint mobilization, Joint manipulation, Aquatic Therapy, Dry Needling, Spinal manipulation, Spinal mobilization, Cryotherapy, Moist heat, scar mobilization, Taping, Traction, Ultrasound, Ionotophoresis 4mg /ml Dexamethasone, Manual therapy, and Re-evaluation  PLAN FOR NEXT SESSION: Shoulder stability, periscapular strengthening    Reather Laurence, PT, DPT 05/19/23, 11:00 AM  [facility] [address], [address] [place], [place] [address] Phone # [phone] Fax [phone]

## 2023-05-24 ENCOUNTER — Ambulatory Visit: Payer: Medicare Other | Admitting: Rehabilitative and Restorative Service Providers"

## 2023-05-24 ENCOUNTER — Encounter: Payer: Self-pay | Admitting: Rehabilitative and Restorative Service Providers"

## 2023-05-24 DIAGNOSIS — M6281 Muscle weakness (generalized): Secondary | ICD-10-CM

## 2023-05-24 DIAGNOSIS — R252 Cramp and spasm: Secondary | ICD-10-CM

## 2023-05-24 DIAGNOSIS — R293 Abnormal posture: Secondary | ICD-10-CM

## 2023-05-24 NOTE — Therapy (Signed)
OUTPATIENT PHYSICAL THERAPY TREATMENT NOTE   Patient Name: Zaydee Gouin MRN: [id number] DOB:06-26-1946, 77 y.o., female Today's Date: 05/24/2023    END OF SESSION:  PT End of Session - 05/24/23 1232     Visit Number 18    Date for PT Re-Evaluation 07/01/23    Authorization Type BC/BS Medicare    Progress Note Due on Visit 20    PT Start Time 1230    PT Stop Time 1310    PT Time Calculation (min) 40 min    Activity Tolerance Patient tolerated treatment well    Behavior During Therapy [facility] for tasks assessed/performed              Past Medical History:  Diagnosis Date   Anxiety    Arthritis of both knees 03/22/2017   Asthma    as a child, no issues in adulthood   Depression    Fatty liver    GERD (gastroesophageal reflux disease)    Hypertension    Multiple thyroid nodules    Osteoporosis 03/22/2017   Pancytopenia (HCC) 03/22/2017   Past Surgical History:  Procedure Laterality Date   ANTERIOR CERVICAL DECOMP/DISCECTOMY FUSION N/A 02/21/2023   Procedure: Cervical Three-Cervical Four, Cervical Four - Cervical Five, Cervical Five-Cervical Six  Anterior Cervical Discectomy Fusion;  Surgeon: Barnett Abu, MD;  Location: [facility] OR;  Service: Neurosurgery;  Laterality: N/A;  RM 21 3C   KNEE SURGERY Left    Patient Active Problem List   Diagnosis Date Noted   Cervical spondylosis with myelopathy 02/21/2023   Pancytopenia (HCC) 03/22/2017   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   Arthritis of both knees 03/22/2017   Osteoporosis 03/22/2017    PCP: Sigmund Hazel, MD  REFERRING PROVIDER: Barnett Abu, MD  REFERRING DIAG: G95.9 (ICD-10-CM) - Disease of spinal cord, unspecified  THERAPY DIAG:  Muscle weakness (generalized)  Cramp and spasm  Abnormal posture  Rationale for Evaluation and Treatment: Rehabilitation  ONSET DATE: Anterior cervical decompression C3-4 C4-5 C5-6 on 02/21/2023  SUBJECTIVE:                                                                                                                                                                                                          SUBJECTIVE STATEMENT: Pt reports she has been feeling some tightness in her cervical/shoulder region.  Hand dominance: Right  PERTINENT HISTORY:  Anterior cervical decompression C3-4 C4-5 C5-6 on 02/21/2023  PAIN:  Are you having pain? Yes: NPRS scale: 3/10 Pain location: left shoulder primarily Pain description: sore Aggravating factors: movement Relieving factors:  rest  PRECAUTIONS: None  RED FLAGS: None     WEIGHT BEARING RESTRICTIONS: No  FALLS:  Has patient fallen in last 6 months? 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  NEXT MD VISIT: 2 months with Dr Danielle Dess  OBJECTIVE:   DIAGNOSTIC FINDINGS:  Cervical Radiograph on 02/21/2023: FINDINGS: Cross-table lateral images were obtained for surgical planning purposes. These demonstrate placement of a metallic marker overlying the U1-3 disc space. Second image demonstrates placement of an ACDF with intervertebral spacers at C3-6.  Cervical CT Scan on 03/24/2023: IMPRESSION: 1. Uncomplicated C3-C6 ACDF with bridging bone seen through each cage. 2. No interval adjacent segment degeneration when compared to preoperative MRI. 3. Heterogeneous enlargement the left lobe thyroid. Recommend thyroid ultrasound (ref: J Am Coll Radiol. 2015 Feb;12(2): 143-50).  PATIENT SURVEYS:  Eval:  Quick Sharilyn Sites 40.91 05/03/2023:  Quick DASH 27.27   COGNITION: Overall cognitive status: Within functional limits for tasks assessed  SENSATION: Reports minimal numbness and  tingling in right hand, but has improved since before surgery  POSTURE: rounded shoulders  CERVICAL ROM:   Active ROM A/ROM (deg) eval  Flexion 47  Extension 45  Right lateral flexion 32  Left lateral flexion 40  Right rotation 62  Left rotation 65   (Blank rows = not tested)  UPPER EXTREMITY ROM:  Active ROM Right eval Right 04/07/23 Right 04/19/23 Right 05/03/23 Left eval  Shoulder flexion 35 57 60 78 160  Shoulder extension 65 65 65 68 80  Shoulder abduction 55 60 85 88 155  Shoulder adduction       Shoulder internal rotation       Shoulder external rotation        (Blank rows = not tested)  UPPER EXTREMITY MMT:  MMT Right eval Right 04/26/23 Left eval  Shoulder flexion 2 2+ 5  Shoulder extension 3 3+ 5  Shoulder abduction 2 2+ 5  Grip strength 40 lbs 47 lbs 47 lbs   (Blank rows = not tested)   TODAY'S TREATMENT:                                                                                                                               DATE: 05/24/2023 Nustep level 5 x6 min with PT present to discuss status Standing facing wall performing wall CW and CCW circles with hand on towel 2x10 each Standing 3 way scapular stabilization with green loop x10 bilat Wall push-up 2x10 Standing UE Ranger for right shoulder flexion and abduction with 1# on right wrist 2x10 each Bilateral small yellow physioball up wall with 1.5# on right wrist  2x10 Supine shoulder flexion 4# AA/ROM with dowel 2x10 Supine chest press 4# with cane 2x10 Supine serratus punch with 3# 2x10 bilat Supine finger tracing of alphabet with arm in 90 deg flexion and 3# A-Z bilat Left side-lying right shoulder abduction 2x10   DATE: 05/19/2023 Nustep level 5 x6 min with PT present to discuss status Standing facing wall performing wall CW and CCW circles with hand on towel 2x10 each Standing Forearm 3 way scapular retraction with green loop x5 bilat Wall push-up 2x10 Bilateral small yellow physioball  up wall with 1# on right wrist 2x10 Standing UE Ranger for right shoulder flexion and abduction with 1# on right wrist 2x10 each Supine shoulder flexion 4# AA/ROM with dowel 2x10 Supine chest press 4# with cane 2x10 Supine serratus punch with 3# 2x10 bilat    DATE: 05/13/2023 Nustep level 5 x6 min with PT present to discuss status AAROM Shoulder Abduction and flexion with towel on wall 2 x 10 each - abduction more difficult  Standing Forearm 3 way scapular retraction with green loop x5 bilat Wall push-up 2x10 Supine shoulder flexion 4# AA/ROM with dowel 2x10 Supine chest press 4# with cane 2x10 Supine serratus punch with 3# 2x10 bilat Supine shoulder flexion x 10 Lt Side lying shoulder abduction 2 x 10 Supine finger tracing of alphabet with arm in 90 deg flexion and 3# A-Z bilat    PATIENT EDUCATION:  Education details: Issued HEP Person educated: Patient Education method: Explanation, Facilities manager, and Handouts Education comprehension: verbalized understanding and returned demonstration  HOME EXERCISE PROGRAM: Access Code: [email or web address] URL: [email or web address] Date: 04/07/2023 Prepared by: Reather Laurence  Exercises - Seated Shoulder Flexion Towel Slide at Table Top  - 1 x daily - 7 x weekly - 2 sets - 10 reps - Seated Shoulder Abduction Towel Slide at Table Top  - 1 x daily - 7 x weekly - 2 sets - 10 reps - Shoulder Flexion Wall Slide with Towel  - 1 x daily - 7 x weekly - 2 sets - 10 reps - Supine Shoulder Flexion Extension AAROM with Dowel  - 1 x daily - 7 x weekly - 2 sets - 10 reps - Supine Chest Press with Dumbbells  - 1 x daily - 7 x weekly - 2 sets - 10 reps - Supine Shoulder Press AAROM in Abduction with Dowel  - 1 x daily - 7 x weekly - 2 sets - 10 reps - Sidelying Shoulder External Rotation  - 1 x daily - 7 x weekly - 2 sets - 10 reps - Standing Shoulder Row with Anchored Resistance  - 1 x daily - 7 x weekly - 2 sets - 10 reps - Shoulder extension with  resistance - Neutral  - 1 x daily - 7 x weekly - 2 sets - 10 reps  ASSESSMENT:  CLINICAL IMPRESSION: Ms Eckersley presents to skilled PT reporting continued progress during functional tasks at home.  Patient continues to progress with increased strengthening and able to increase some weight today.  Patient continues to work towards decreased right shoulder shrugging compensations during exercises.  Patient continues to require continued strengthening to progress towards functional goals and ability to do more tasks, such as picking up objects out of her oven.  OBJECTIVE IMPAIRMENTS: decreased ROM, decreased strength, increased muscle spasms, impaired flexibility, postural dysfunction, and pain.   ACTIVITY LIMITATIONS: carrying, lifting, reach over head, and hygiene/grooming  PARTICIPATION LIMITATIONS: meal prep, cleaning, laundry, community activity, and occupation  PERSONAL FACTORS: Fitness and  1 comorbidity: s/p cervical ACDF on 02/21/2023  are also affecting patient's functional outcome.   REHAB POTENTIAL: Good  CLINICAL DECISION MAKING: Stable/uncomplicated  EVALUATION COMPLEXITY: Low   GOALS: Goals reviewed with patient? Yes  SHORT TERM GOALS: Target date: 04/01/2023  Pt will be independent with initial HEP. Baseline:  Goal status: MET  2.  Pt will report at least 25% decrease in symptoms. Baseline:  Goal status: MET   LONG TERM GOALS: Target date: 07/01/2023  Pt will be independent with initial HEP. Baseline:  Goal status: Ongoing  2.  Pt will increase Quick DASH to no greater than 25 to demonstrate her ability to complete functional tasks. Baseline: 40.91 Goal status: Ongoing (see above)  3.  Pt will increase right shoulder strength to at least 4/5 to allow her to lift objects with her right hand and use for ADLs/IADLs. Baseline: 2/5 Goal status: Ongoing (see above)  4.  Patient will increase her right shoulder A/ROM to at least 110 degrees flexion and abduction to  allow her to reach into overhead cabinets. Baseline: see above Goal status: Ongoing  5.  Patient will increase right grip strength to greater than 50 pounds to allow her to grab and hold items in the home. Baseline: 40 lbs Goal status: Ongoing  6.  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 Baseline:  Goal status: Ongoing   PLAN:  PT FREQUENCY: 2x/week  PT DURATION: 8 weeks  PLANNED INTERVENTIONS: Therapeutic exercises, Therapeutic activity, Neuromuscular re-education, Balance training, Gait training, Patient/Family education, Self Care, Joint mobilization, Joint manipulation, Aquatic Therapy, Dry Needling, Spinal manipulation, Spinal mobilization, Cryotherapy, Moist heat, scar mobilization, Taping, Traction, Ultrasound, Ionotophoresis 4mg /ml Dexamethasone, Manual therapy, and Re-evaluation  PLAN FOR NEXT SESSION: Shoulder stability, periscapular strengthening    Reather Laurence, PT, DPT 05/24/23, 1:17 PM  [facility] [address], [address] [place], [place] [address] Phone # [phone] Fax [phone]

## 2023-05-26 ENCOUNTER — Ambulatory Visit: Payer: Medicare Other | Admitting: Rehabilitative and Restorative Service Providers"

## 2023-05-26 ENCOUNTER — Encounter: Payer: Self-pay | Admitting: Rehabilitative and Restorative Service Providers"

## 2023-05-26 DIAGNOSIS — R252 Cramp and spasm: Secondary | ICD-10-CM | POA: Diagnosis not present

## 2023-05-26 DIAGNOSIS — R293 Abnormal posture: Secondary | ICD-10-CM

## 2023-05-26 DIAGNOSIS — M6281 Muscle weakness (generalized): Secondary | ICD-10-CM

## 2023-05-26 NOTE — Therapy (Signed)
OUTPATIENT PHYSICAL THERAPY TREATMENT NOTE   Patient Name: Sandra Day MRN: [id number] DOB:[birth year], 77 y.o., female Today's Date: 05/26/2023    END OF SESSION:  PT End of Session - 05/26/23 1017     Visit Number 19    Date for PT Re-Evaluation 07/01/23    Authorization Type BC/BS Medicare    Progress Note Due on Visit 20    PT Start Time 1015    PT Stop Time 1055    PT Time Calculation (min) 40 min    Activity Tolerance Patient tolerated treatment well    Behavior During Therapy [facility] for tasks assessed/performed              Past Medical History:  Diagnosis Date   Anxiety    Arthritis of both knees 03/22/2017   Asthma    as a child, no issues in adulthood   Depression    Fatty liver    GERD (gastroesophageal reflux disease)    Hypertension    Multiple thyroid nodules    Osteoporosis 03/22/2017   Pancytopenia (HCC) 03/22/2017   Past Surgical History:  Procedure Laterality Date   ANTERIOR CERVICAL DECOMP/DISCECTOMY FUSION N/A 02/21/2023   Procedure: Cervical Three-Cervical Four, Cervical Four - Cervical Five, Cervical Five-Cervical Six  Anterior Cervical Discectomy Fusion;  Surgeon: Barnett Abu, MD;  Location: [facility] OR;  Service: Neurosurgery;  Laterality: N/A;  RM 21 3C   KNEE SURGERY Left    Patient Active Problem List   Diagnosis Date Noted   Cervical spondylosis with myelopathy 02/21/2023   Pancytopenia (HCC) 03/22/2017   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   Arthritis of both knees 03/22/2017   Osteoporosis 03/22/2017    PCP: Sigmund Hazel, MD  REFERRING PROVIDER: Barnett Abu, MD  REFERRING DIAG: G95.9 (ICD-10-CM) - Disease of spinal cord, unspecified  THERAPY DIAG:  Muscle weakness (generalized)  Cramp and spasm  Abnormal posture  Rationale for Evaluation and Treatment: Rehabilitation  ONSET DATE: Anterior cervical decompression C3-4 C4-5 C5-6 on 02/21/2023  SUBJECTIVE:                                                                                                                                                                                                          SUBJECTIVE STATEMENT: Pt reports similar feelings to last session.  Hand dominance: Right  PERTINENT HISTORY:  Anterior cervical decompression C3-4 C4-5 C5-6 on 02/21/2023  PAIN:  Are you having pain? Yes: NPRS scale: 2/10 Pain location: left shoulder primarily Pain description: sore Aggravating factors: movement Relieving factors: rest  PRECAUTIONS: None  RED FLAGS: None     WEIGHT BEARING RESTRICTIONS: No  FALLS:  Has patient fallen in last 6 months? 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  NEXT MD VISIT: 2 months with Dr Danielle Dess  OBJECTIVE:   DIAGNOSTIC FINDINGS:  Cervical Radiograph on 02/21/2023: FINDINGS: Cross-table lateral images were obtained for surgical planning purposes. These demonstrate placement of a metallic marker overlying the Y8-6 disc space. Second image demonstrates placement of an ACDF with intervertebral spacers at C3-6.  Cervical CT Scan on 03/24/2023: IMPRESSION: 1. Uncomplicated C3-C6 ACDF with bridging bone seen through each cage. 2. No interval adjacent segment degeneration when compared to preoperative MRI. 3. Heterogeneous enlargement the left lobe thyroid. Recommend thyroid ultrasound (ref: J Am Coll Radiol. 2015 Feb;12(2): 143-50).  PATIENT SURVEYS:  Eval:  Quick Sharilyn Sites 40.91 05/03/2023:  Quick DASH 27.27   COGNITION: Overall cognitive status: Within functional limits for tasks assessed  SENSATION: Reports minimal numbness and tingling in right hand, but has improved  since before surgery  POSTURE: rounded shoulders  CERVICAL ROM:   Active ROM A/ROM (deg) eval  Flexion 47  Extension 45  Right lateral flexion 32  Left lateral flexion 40  Right rotation 62  Left rotation 65   (Blank rows = not tested)  UPPER EXTREMITY ROM:  Active ROM Right eval Right 04/07/23 Right 04/19/23 Right 05/03/23 Left eval  Shoulder flexion 35 57 60 78 160  Shoulder extension 65 65 65 68 80  Shoulder abduction 55 60 85 88 155  Shoulder adduction       Shoulder internal rotation       Shoulder external rotation        (Blank rows = not tested)  UPPER EXTREMITY MMT:  MMT Right eval Right 04/26/23 Left eval  Shoulder flexion 2 2+ 5  Shoulder extension 3 3+ 5  Shoulder abduction 2 2+ 5  Grip strength 40 lbs 47 lbs 47 lbs   (Blank rows = not tested)   TODAY'S TREATMENT:                                                                                                                               DATE: 05/26/2023 Nustep level 5 x6 min with PT present to discuss status Standing facing wall performing wall CW and CCW circles with hand on towel 2x10 each Standing 3 way scapular stabilization with green loop x10 bilat Standing shoulder ER with green loop 2x10 Wall push-up 2x10 Standing UE Ranger for right shoulder flexion and abduction with 1.5# on right wrist 2x10 each Bilateral small yellow physioball up wall with 1.5# on  right wrist 2x10 Supine shoulder flexion 4# AA/ROM with dowel 2x10 Supine chest press 4# with cane 2x10 Supine serratus punch with 3# 2x10 bilat Supine finger tracing of alphabet with arm in 90 deg flexion and 3# A-Z bilat   DATE: 05/24/2023 Nustep level 5 x6 min with PT present to discuss status Standing facing wall performing wall CW and CCW circles with hand on towel 2x10 each Standing 3 way scapular stabilization with green loop x10 bilat Wall push-up 2x10 Standing UE Ranger for right shoulder flexion and abduction with 1# on right  wrist 2x10 each Bilateral small yellow physioball up wall with 1.5# on right wrist 2x10 Supine shoulder flexion 4# AA/ROM with dowel 2x10 Supine chest press 4# with cane 2x10 Supine serratus punch with 3# 2x10 bilat Supine finger tracing of alphabet with arm in 90 deg flexion and 3# A-Z bilat Left side-lying right shoulder abduction 2x10   DATE: 05/19/2023 Nustep level 5 x6 min with PT present to discuss status Standing facing wall performing wall CW and CCW circles with hand on towel 2x10 each Standing Forearm 3 way scapular retraction with green loop x5 bilat Wall push-up 2x10 Bilateral small yellow physioball up wall with 1# on right wrist 2x10 Standing UE Ranger for right shoulder flexion and abduction with 1# on right wrist 2x10 each Supine shoulder flexion 4# AA/ROM with dowel 2x10 Supine chest press 4# with cane 2x10 Supine serratus punch with 3# 2x10 bilat     PATIENT EDUCATION:  Education details: Issued HEP Person educated: Patient Education method: Programmer, multimedia, Facilities manager, and Handouts Education comprehension: verbalized understanding and returned demonstration  HOME EXERCISE PROGRAM: Access Code: [email or web address] URL: [email or web address] Date: 04/07/2023 Prepared by: Reather Laurence  Exercises - Seated Shoulder Flexion Towel Slide at Table Top  - 1 x daily - 7 x weekly - 2 sets - 10 reps - Seated Shoulder Abduction Towel Slide at Table Top  - 1 x daily - 7 x weekly - 2 sets - 10 reps - Shoulder Flexion Wall Slide with Towel  - 1 x daily - 7 x weekly - 2 sets - 10 reps - Supine Shoulder Flexion Extension AAROM with Dowel  - 1 x daily - 7 x weekly - 2 sets - 10 reps - Supine Chest Press with Dumbbells  - 1 x daily - 7 x weekly - 2 sets - 10 reps - Supine Shoulder Press AAROM in Abduction with Dowel  - 1 x daily - 7 x weekly - 2 sets - 10 reps - Sidelying Shoulder External Rotation  - 1 x daily - 7 x weekly - 2 sets - 10 reps - Standing Shoulder Row with  Anchored Resistance  - 1 x daily - 7 x weekly - 2 sets - 10 reps - Shoulder extension with resistance - Neutral  - 1 x daily - 7 x weekly - 2 sets - 10 reps  ASSESSMENT:  CLINICAL IMPRESSION: Ms Albach presents to skilled PT reporting continued progress with improved overall strength.  Patient is progressing overall with continued strength.  Patient continues to progress with exercises and requires minimal cuing for technique.  Patient without complaints of increased pain throughout session.    OBJECTIVE IMPAIRMENTS: decreased ROM, decreased strength, increased muscle spasms, impaired flexibility, postural dysfunction, and pain.   ACTIVITY LIMITATIONS: carrying, lifting, reach over head, and hygiene/grooming  PARTICIPATION LIMITATIONS: meal prep, cleaning, laundry, community activity, and occupation  PERSONAL FACTORS: Fitness and 1 comorbidity: s/p cervical ACDF on 02/21/2023  are also affecting  patient's functional outcome.   REHAB POTENTIAL: Good  CLINICAL DECISION MAKING: Stable/uncomplicated  EVALUATION COMPLEXITY: Low   GOALS: Goals reviewed with patient? Yes  SHORT TERM GOALS: Target date: 04/01/2023  Pt will be independent with initial HEP. Baseline:  Goal status: MET  2.  Pt will report at least 25% decrease in symptoms. Baseline:  Goal status: MET   LONG TERM GOALS: Target date: 07/01/2023  Pt will be independent with initial HEP. Baseline:  Goal status: Ongoing  2.  Pt will increase Quick DASH to no greater than 25 to demonstrate her ability to complete functional tasks. Baseline: 40.91 Goal status: Ongoing (see above)  3.  Pt will increase right shoulder strength to at least 4/5 to allow her to lift objects with her right hand and use for ADLs/IADLs. Baseline: 2/5 Goal status: Ongoing (see above)  4.  Patient will increase her right shoulder A/ROM to at least 110 degrees flexion and abduction to allow her to reach into overhead cabinets. Baseline: see  above Goal status: Ongoing  5.  Patient will increase right grip strength to greater than 50 pounds to allow her to grab and hold items in the home. Baseline: 40 lbs Goal status: Ongoing  6.  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 Baseline:  Goal status: Ongoing   PLAN:  PT FREQUENCY: 2x/week  PT DURATION: 8 weeks  PLANNED INTERVENTIONS: Therapeutic exercises, Therapeutic activity, Neuromuscular re-education, Balance training, Gait training, Patient/Family education, Self Care, Joint mobilization, Joint manipulation, Aquatic Therapy, Dry Needling, Spinal manipulation, Spinal mobilization, Cryotherapy, Moist heat, scar mobilization, Taping, Traction, Ultrasound, Ionotophoresis 4mg /ml Dexamethasone, Manual therapy, and Re-evaluation  PLAN FOR NEXT SESSION: Shoulder stability, periscapular strengthening    Reather Laurence, PT, DPT 05/26/23, 12:30 PM  [facility] [address] [place], [place] [address] Phone # [phone] Fax [phone]

## 2023-05-31 ENCOUNTER — Encounter: Payer: Self-pay | Admitting: Rehabilitative and Restorative Service Providers"

## 2023-05-31 ENCOUNTER — Ambulatory Visit: Payer: Medicare Other | Attending: Neurological Surgery | Admitting: Rehabilitative and Restorative Service Providers"

## 2023-05-31 DIAGNOSIS — R252 Cramp and spasm: Secondary | ICD-10-CM | POA: Diagnosis not present

## 2023-05-31 DIAGNOSIS — R293 Abnormal posture: Secondary | ICD-10-CM | POA: Insufficient documentation

## 2023-05-31 DIAGNOSIS — M6281 Muscle weakness (generalized): Secondary | ICD-10-CM | POA: Insufficient documentation

## 2023-05-31 NOTE — Therapy (Signed)
OUTPATIENT PHYSICAL THERAPY TREATMENT NOTE   Patient Name: Sandra Day MRN: [id number] DOB:02/24/1946, 77 y.o., female Today's Date: 05/31/2023   Progress Note Reporting Period 05/03/2023 to 05/31/2023  See note below for Objective Data and Assessment of Progress/Goals.     END OF SESSION:  PT End of Session - 05/31/23 1018     Visit Number 20    Date for PT Re-Evaluation 07/01/23    Authorization Type BC/BS Medicare    Progress Note Due on Visit 30    PT Start Time 1016    PT Stop Time 1055    PT Time Calculation (min) 39 min    Activity Tolerance Patient tolerated treatment well    Behavior During Therapy WFL for tasks assessed/performed              Past Medical History:  Diagnosis Date   Anxiety    Arthritis of both knees 03/22/2017   Asthma    as a child, no issues in adulthood   Depression    Fatty liver    GERD (gastroesophageal reflux disease)    Hypertension    Multiple thyroid nodules    Osteoporosis 03/22/2017   Pancytopenia (HCC) 03/22/2017   Past Surgical History:  Procedure Laterality Date   ANTERIOR CERVICAL DECOMP/DISCECTOMY FUSION N/A 02/21/2023   Procedure: Cervical Three-Cervical Four, Cervical Four - Cervical Five, Cervical Five-Cervical Six  Anterior Cervical Discectomy Fusion;  Surgeon: Barnett Abu, MD;  Location: [facility] OR;  Service: Neurosurgery;  Laterality: N/A;  RM 21 3C   KNEE SURGERY Left    Patient Active Problem List   Diagnosis Date Noted   Cervical spondylosis with myelopathy 02/21/2023   Pancytopenia (HCC) 03/22/2017   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   Arthritis of both knees 03/22/2017   Osteoporosis 03/22/2017    PCP: Sigmund Hazel, MD  REFERRING PROVIDER: Barnett Abu, MD  REFERRING DIAG: G95.9 (ICD-10-CM) - Disease of spinal cord, unspecified  THERAPY DIAG:  Muscle weakness (generalized)  Cramp and spasm  Abnormal posture  Rationale for Evaluation and Treatment: Rehabilitation  ONSET  DATE: Anterior cervical decompression C3-4 C4-5 C5-6 on 02/21/2023  SUBJECTIVE:                                                                                                                                                                                                         SUBJECTIVE STATEMENT: Pt reports she is now fostering a chocolate lab.  She states that she has not tried to take him on a walk yet.  Hand dominance: Right  PERTINENT HISTORY:  Anterior cervical decompression C3-4 C4-5 C5-6 on 02/21/2023  PAIN:  Are you having pain? Yes: NPRS scale: 2/10 Pain location: left shoulder primarily Pain description: sore Aggravating factors: movement Relieving factors: rest  PRECAUTIONS: None  RED FLAGS: None     WEIGHT BEARING RESTRICTIONS: No  FALLS:  Has patient fallen in last 6 months? 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  NEXT MD VISIT: 2 months with Dr Danielle Dess  OBJECTIVE:   DIAGNOSTIC FINDINGS:  Cervical Radiograph on 02/21/2023: FINDINGS: Cross-table lateral images were obtained for surgical planning purposes. These demonstrate placement of a metallic marker overlying the N5-6 disc space. Second image demonstrates placement of an ACDF with intervertebral spacers at C3-6.  Cervical CT Scan on 03/24/2023: IMPRESSION: 1. Uncomplicated C3-C6 ACDF with bridging bone seen through each cage. 2. No interval adjacent segment degeneration when compared to preoperative MRI. 3. Heterogeneous enlargement the left lobe thyroid. Recommend thyroid ultrasound (ref: J Am Coll Radiol. 2015 Feb;12(2): 143-50).  PATIENT SURVEYS:  Eval:  Quick Sharilyn Sites  40.91 05/03/2023:  Quick DASH 27.27   COGNITION: Overall cognitive status: Within functional limits for tasks assessed  SENSATION: Reports minimal numbness and tingling in right hand, but has improved since before surgery  POSTURE: rounded shoulders  CERVICAL ROM:   Active ROM A/ROM (deg) eval  Flexion 47  Extension 45  Right lateral flexion 32  Left lateral flexion 40  Right rotation 62  Left rotation 65   (Blank rows = not tested)  UPPER EXTREMITY ROM:  Active ROM Right eval Right 04/07/23 Right 04/19/23 Right 05/03/23 Right 05/31/23 Left eval  Shoulder flexion 35 57 60 78 86 160  Shoulder extension 65 65 65 68 74 80  Shoulder abduction 55 60 85 88 100 155  Shoulder adduction        Shoulder internal rotation        Shoulder external rotation         (Blank rows = not tested)  UPPER EXTREMITY MMT:  MMT Right eval Right 04/26/23 Right 05/31/23 Left eval  Shoulder flexion 2 2+ 3- 5  Shoulder extension 3 3+ 3+ 5  Shoulder abduction 2 2+ 3- 5  Grip strength 40 lbs 47 lbs 52 lbs 47 lbs   (Blank rows = not tested)   TODAY'S TREATMENT:                                                                                                                               DATE: 05/31/2023 Nustep level 5 x6 min with PT present to discuss status Standing facing wall performing wall CW and CCW  circles with hand on towel 2x10 each Standing 3 way scapular stabilization with green loop x10 bilat Standing shoulder ER with green loop 2x10 Wall push-up 2x10 Supine shoulder flexion with 1# 2x10 RUE Supine chest press with 1# 2x10 RUE Supine cervical retraction 2x10 Left side-lying right shoulder abduction 2x10 Prone with 1#:  shoulder flexion, shoulder extension, shoulder row.  Right UE 2x10 each   DATE: 05/26/2023 Nustep level 5 x6 min with PT present to discuss status Standing facing wall performing wall CW and CCW circles with hand on towel 2x10 each Standing 3 way scapular  stabilization with green loop x10 bilat Standing shoulder ER with green loop 2x10 Wall push-up 2x10 Standing UE Ranger for right shoulder flexion and abduction with 1.5# on right wrist 2x10 each Bilateral small yellow physioball up wall with 1.5# on right wrist 2x10 Supine shoulder flexion 4# AA/ROM with dowel 2x10 Supine chest press 4# with cane 2x10 Supine serratus punch with 3# 2x10 bilat Supine finger tracing of alphabet with arm in 90 deg flexion and 3# A-Z bilat   DATE: 05/24/2023 Nustep level 5 x6 min with PT present to discuss status Standing facing wall performing wall CW and CCW circles with hand on towel 2x10 each Standing 3 way scapular stabilization with green loop x10 bilat Wall push-up 2x10 Standing UE Ranger for right shoulder flexion and abduction with 1# on right wrist 2x10 each Bilateral small yellow physioball up wall with 1.5# on right wrist 2x10 Supine shoulder flexion 4# AA/ROM with dowel 2x10 Supine chest press 4# with cane 2x10 Supine serratus punch with 3# 2x10 bilat Supine finger tracing of alphabet with arm in 90 deg flexion and 3# A-Z bilat Left side-lying right shoulder abduction 2x10     PATIENT EDUCATION:  Education details: Issued HEP Person educated: Patient Education method: Explanation, Facilities manager, and Handouts Education comprehension: verbalized understanding and returned demonstration  HOME EXERCISE PROGRAM: Access Code: [email or web address] URL: [email or web address] Date: 04/07/2023 Prepared by: Reather Laurence  Exercises - Seated Shoulder Flexion Towel Slide at Table Top  - 1 x daily - 7 x weekly - 2 sets - 10 reps - Seated Shoulder Abduction Towel Slide at Table Top  - 1 x daily - 7 x weekly - 2 sets - 10 reps - Shoulder Flexion Wall Slide with Towel  - 1 x daily - 7 x weekly - 2 sets - 10 reps - Supine Shoulder Flexion Extension AAROM with Dowel  - 1 x daily - 7 x weekly - 2 sets - 10 reps - Supine Chest Press with Dumbbells  -  1 x daily - 7 x weekly - 2 sets - 10 reps - Supine Shoulder Press AAROM in Abduction with Dowel  - 1 x daily - 7 x weekly - 2 sets - 10 reps - Sidelying Shoulder External Rotation  - 1 x daily - 7 x weekly - 2 sets - 10 reps - Standing Shoulder Row with Anchored Resistance  - 1 x daily - 7 x weekly - 2 sets - 10 reps - Shoulder extension with resistance - Neutral  - 1 x daily - 7 x weekly - 2 sets - 10 reps  ASSESSMENT:  CLINICAL IMPRESSION: Ms Mehlhorn presents to skilled PT for 20th visit reassessment today.  Patient with increased grip strength noted today with overall improved shoulder strength. Patient is progressing well towards goals and able to increase unilateral weight training today.  Patient continues to progress overall goals and has met grip strength goal at  this time.  OBJECTIVE IMPAIRMENTS: decreased ROM, decreased strength, increased muscle spasms, impaired flexibility, postural dysfunction, and pain.   ACTIVITY LIMITATIONS: carrying, lifting, reach over head, and hygiene/grooming  PARTICIPATION LIMITATIONS: meal prep, cleaning, laundry, community activity, and occupation  PERSONAL FACTORS: Fitness and 1 comorbidity: s/p cervical ACDF on 02/21/2023  are also affecting patient's functional outcome.   REHAB POTENTIAL: Good  CLINICAL DECISION MAKING: Stable/uncomplicated  EVALUATION COMPLEXITY: Low   GOALS: Goals reviewed with patient? Yes  SHORT TERM GOALS: Target date: 04/01/2023  Pt will be independent with initial HEP. Baseline:  Goal status: MET  2.  Pt will report at least 25% decrease in symptoms. Baseline:  Goal status: MET   LONG TERM GOALS: Target date: 07/01/2023  Pt will be independent with initial HEP. Baseline:  Goal status: Ongoing  2.  Pt will increase Quick DASH to no greater than 25 to demonstrate her ability to complete functional tasks. Baseline: 40.91 Goal status: Ongoing (see above)  3.  Pt will increase right shoulder strength to at  least 4/5 to allow her to lift objects with her right hand and use for ADLs/IADLs. Baseline: 2/5 Goal status: Ongoing (see above)  4.  Patient will increase her right shoulder A/ROM to at least 110 degrees flexion and abduction to allow her to reach into overhead cabinets. Baseline: see above Goal status: Ongoing (see above)  5.  Patient will increase right grip strength to greater than 50 pounds to allow her to grab and hold items in the home. Baseline: 40 lbs Goal status: Met 05/31/2023  6.  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 Baseline:  Goal status: Ongoing   PLAN:  PT FREQUENCY: 2x/week  PT DURATION: 8 weeks  PLANNED INTERVENTIONS: Therapeutic exercises, Therapeutic activity, Neuromuscular re-education, Balance training, Gait training, Patient/Family education, Self Care, Joint mobilization, Joint manipulation, Aquatic Therapy, Dry Needling, Spinal manipulation, Spinal mobilization, Cryotherapy, Moist heat, scar mobilization, Taping, Traction, Ultrasound, Ionotophoresis 4mg /ml Dexamethasone, Manual therapy, and Re-evaluation  PLAN FOR NEXT SESSION: Shoulder stability, periscapular strengthening    Reather Laurence, PT, DPT 05/31/23, 11:02 AM  [facility] [address], [address] [place], [place] [address] Phone # [phone] Fax [phone]

## 2023-06-02 ENCOUNTER — Ambulatory Visit: Payer: Medicare Other | Admitting: Rehabilitative and Restorative Service Providers"

## 2023-06-02 ENCOUNTER — Encounter: Payer: Self-pay | Admitting: Rehabilitative and Restorative Service Providers"

## 2023-06-02 DIAGNOSIS — M6281 Muscle weakness (generalized): Secondary | ICD-10-CM

## 2023-06-02 DIAGNOSIS — R252 Cramp and spasm: Secondary | ICD-10-CM | POA: Diagnosis not present

## 2023-06-02 DIAGNOSIS — R293 Abnormal posture: Secondary | ICD-10-CM

## 2023-06-02 NOTE — Therapy (Signed)
OUTPATIENT PHYSICAL THERAPY TREATMENT NOTE   Patient Name: Sandra Day MRN: [id number] DOB:[birth year], 77 y.o., female Today's Date: 06/02/2023    END OF SESSION:  PT End of Session - 06/02/23 0936     Visit Number 21    Date for PT Re-Evaluation 07/01/23    Authorization Type BC/BS Medicare    Progress Note Due on Visit 30    PT Start Time 0930    PT Stop Time 1010    PT Time Calculation (min) 40 min    Activity Tolerance Patient tolerated treatment well    Behavior During Therapy [facility] for tasks assessed/performed              Past Medical History:  Diagnosis Date   Anxiety    Arthritis of both knees 03/22/2017   Asthma    as a child, no issues in adulthood   Depression    Fatty liver    GERD (gastroesophageal reflux disease)    Hypertension    Multiple thyroid nodules    Osteoporosis 03/22/2017   Pancytopenia (HCC) 03/22/2017   Past Surgical History:  Procedure Laterality Date   ANTERIOR CERVICAL DECOMP/DISCECTOMY FUSION N/A 02/21/2023   Procedure: Cervical Three-Cervical Four, Cervical Four - Cervical Five, Cervical Five-Cervical Six  Anterior Cervical Discectomy Fusion;  Surgeon: Barnett Abu, MD;  Location: [facility] OR;  Service: Neurosurgery;  Laterality: N/A;  RM 21 3C   KNEE SURGERY Left    Patient Active Problem List   Diagnosis Date Noted   Cervical spondylosis with myelopathy 02/21/2023   Pancytopenia (HCC) 03/22/2017   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   Arthritis of both knees 03/22/2017   Osteoporosis 03/22/2017    PCP: Sigmund Hazel, MD  REFERRING PROVIDER: Barnett Abu, MD  REFERRING DIAG: G95.9 (ICD-10-CM) - Disease of spinal cord, unspecified  THERAPY DIAG:  Muscle weakness (generalized)  Cramp and spasm  Abnormal posture  Rationale for Evaluation and Treatment: Rehabilitation  ONSET DATE: Anterior cervical decompression C3-4 C4-5 C5-6 on 02/21/2023  SUBJECTIVE:                                                                                                                                                                                                          SUBJECTIVE STATEMENT: Pt reports that things are going well for her fostering the dog and she has been able to go on walks.  Hand dominance: Right  PERTINENT HISTORY:  Anterior cervical decompression C3-4 C4-5 C5-6 on 02/21/2023  PAIN:  Are you having pain? Yes: NPRS scale: 4/10 Pain location: left shoulder  primarily Pain description: sore Aggravating factors: movement Relieving factors: rest  PRECAUTIONS: None  RED FLAGS: None     WEIGHT BEARING RESTRICTIONS: No  FALLS:  Has patient fallen in last 6 months? 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  NEXT MD VISIT: 2 months with Dr Danielle Dess  OBJECTIVE:   DIAGNOSTIC FINDINGS:  Cervical Radiograph on 02/21/2023: FINDINGS: Cross-table lateral images were obtained for surgical planning purposes. These demonstrate placement of a metallic marker overlying the Z6-1 disc space. Second image demonstrates placement of an ACDF with intervertebral spacers at C3-6.  Cervical CT Scan on 03/24/2023: IMPRESSION: 1. Uncomplicated C3-C6 ACDF with bridging bone seen through each cage. 2. No interval adjacent segment degeneration when compared to preoperative MRI. 3. Heterogeneous enlargement the left lobe thyroid. Recommend thyroid ultrasound (ref: J Am Coll Radiol. 2015 Feb;12(2): 143-50).  PATIENT SURVEYS:  Eval:  Quick Sharilyn Sites 40.91 05/03/2023:  Quick DASH 27.27   COGNITION: Overall cognitive status: Within functional limits for tasks assessed  SENSATION: Reports  minimal numbness and tingling in right hand, but has improved since before surgery  POSTURE: rounded shoulders  CERVICAL ROM:   Active ROM A/ROM (deg) eval  Flexion 47  Extension 45  Right lateral flexion 32  Left lateral flexion 40  Right rotation 62  Left rotation 65   (Blank rows = not tested)  UPPER EXTREMITY ROM:  Active ROM Right eval Right 04/07/23 Right 04/19/23 Right 05/03/23 Right 05/31/23 Left eval  Shoulder flexion 35 57 60 78 86 160  Shoulder extension 65 65 65 68 74 80  Shoulder abduction 55 60 85 88 100 155  Shoulder adduction        Shoulder internal rotation        Shoulder external rotation         (Blank rows = not tested)  UPPER EXTREMITY MMT:  MMT Right eval Right 04/26/23 Right 05/31/23 Left eval  Shoulder flexion 2 2+ 3- 5  Shoulder extension 3 3+ 3+ 5  Shoulder abduction 2 2+ 3- 5  Grip strength 40 lbs 47 lbs 52 lbs 47 lbs   (Blank rows = not tested)   TODAY'S TREATMENT:                                                                                                                               DATE: 06/02/2023 Nustep level 5 x6 min with PT present to discuss status Standing facing wall performing wall CW and CCW circles with hand on towel 2x10 each Standing 3 way scapular stabilization with green loop x10 bilat Standing shoulder ER with green loop  2x10 Wall push-up 2x10 Supine shoulder flexion with 2# 2x10 Supine serratus punch 2# 2x10 Left side-lying right shoulder abduction 2x10 Prone with 1#:  shoulder flexion, shoulder extension, shoulder row.  Right UE 2x10 each   DATE: 05/31/2023 Nustep level 5 x6 min with PT present to discuss status Standing facing wall performing wall CW and CCW circles with hand on towel 2x10 each Standing 3 way scapular stabilization with green loop x10 bilat Standing shoulder ER with green loop 2x10 Wall push-up 2x10 Supine shoulder flexion with 1# 2x10 RUE Supine chest press with 1# 2x10 RUE Supine  cervical retraction 2x10 Left side-lying right shoulder abduction 2x10 Prone with 1#:  shoulder flexion, shoulder extension, shoulder row.  Right UE 2x10 each   DATE: 05/26/2023 Nustep level 5 x6 min with PT present to discuss status Standing facing wall performing wall CW and CCW circles with hand on towel 2x10 each Standing 3 way scapular stabilization with green loop x10 bilat Standing shoulder ER with green loop 2x10 Wall push-up 2x10 Standing UE Ranger for right shoulder flexion and abduction with 1.5# on right wrist 2x10 each Bilateral small yellow physioball up wall with 1.5# on right wrist 2x10 Supine shoulder flexion 4# AA/ROM with dowel 2x10 Supine chest press 4# with cane 2x10 Supine serratus punch with 3# 2x10 bilat Supine finger tracing of alphabet with arm in 90 deg flexion and 3# A-Z bilat     PATIENT EDUCATION:  Education details: Issued HEP Person educated: Patient Education method: Explanation, Demonstration, and Handouts Education comprehension: verbalized understanding and returned demonstration  HOME EXERCISE PROGRAM: Access Code: [email or web address] URL: [email or web address] Date: 04/07/2023 Prepared by: Reather Laurence  Exercises - Seated Shoulder Flexion Towel Slide at Table Top  - 1 x daily - 7 x weekly - 2 sets - 10 reps - Seated Shoulder Abduction Towel Slide at Table Top  - 1 x daily - 7 x weekly - 2 sets - 10 reps - Shoulder Flexion Wall Slide with Towel  - 1 x daily - 7 x weekly - 2 sets - 10 reps - Supine Shoulder Flexion Extension AAROM with Dowel  - 1 x daily - 7 x weekly - 2 sets - 10 reps - Supine Chest Press with Dumbbells  - 1 x daily - 7 x weekly - 2 sets - 10 reps - Supine Shoulder Press AAROM in Abduction with Dowel  - 1 x daily - 7 x weekly - 2 sets - 10 reps - Sidelying Shoulder External Rotation  - 1 x daily - 7 x weekly - 2 sets - 10 reps - Standing Shoulder Row with Anchored Resistance  - 1 x daily - 7 x weekly - 2 sets - 10  reps - Shoulder extension with resistance - Neutral  - 1 x daily - 7 x weekly - 2 sets - 10 reps  ASSESSMENT:  CLINICAL IMPRESSION: Ms Dooms presents to skilled PT reporting continued progress towards goals.  Patient able to progress weight on supine exercises and no longer requires active assistive help during exercises.  Patient without complaints of pain during exercises.  Patient continues to require skilled PT to progress towards goal related activities.  OBJECTIVE IMPAIRMENTS: decreased ROM, decreased strength, increased muscle spasms, impaired flexibility, postural dysfunction, and pain.   ACTIVITY LIMITATIONS: carrying, lifting, reach over head, and hygiene/grooming  PARTICIPATION LIMITATIONS: meal prep, cleaning, laundry, community activity, and occupation  PERSONAL FACTORS: Fitness and 1 comorbidity: s/p cervical ACDF on 02/21/2023  are also affecting patient's functional outcome.  REHAB POTENTIAL: Good  CLINICAL DECISION MAKING: Stable/uncomplicated  EVALUATION COMPLEXITY: Low   GOALS: Goals reviewed with patient? Yes  SHORT TERM GOALS: Target date: 04/01/2023  Pt will be independent with initial HEP. Baseline:  Goal status: MET  2.  Pt will report at least 25% decrease in symptoms. Baseline:  Goal status: MET   LONG TERM GOALS: Target date: 07/01/2023  Pt will be independent with initial HEP. Baseline:  Goal status: Ongoing  2.  Pt will increase Quick DASH to no greater than 25 to demonstrate her ability to complete functional tasks. Baseline: 40.91 Goal status: Ongoing (see above)  3.  Pt will increase right shoulder strength to at least 4/5 to allow her to lift objects with her right hand and use for ADLs/IADLs. Baseline: 2/5 Goal status: Ongoing (see above)  4.  Patient will increase her right shoulder A/ROM to at least 110 degrees flexion and abduction to allow her to reach into overhead cabinets. Baseline: see above Goal status: Ongoing (see  above)  5.  Patient will increase right grip strength to greater than 50 pounds to allow her to grab and hold items in the home. Baseline: 40 lbs Goal status: Met 05/31/2023  6.  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 Baseline:  Goal status: Ongoing   PLAN:  PT FREQUENCY: 2x/week  PT DURATION: 8 weeks  PLANNED INTERVENTIONS: Therapeutic exercises, Therapeutic activity, Neuromuscular re-education, Balance training, Gait training, Patient/Family education, Self Care, Joint mobilization, Joint manipulation, Aquatic Therapy, Dry Needling, Spinal manipulation, Spinal mobilization, Cryotherapy, Moist heat, scar mobilization, Taping, Traction, Ultrasound, Ionotophoresis 4mg /ml Dexamethasone, Manual therapy, and Re-evaluation  PLAN FOR NEXT SESSION: Shoulder stability, periscapular strengthening    Reather Laurence, PT, DPT 06/02/23, 10:37 AM  [facility] [address], [address] [place], [place] [address] Phone # [phone] Fax [phone]

## 2023-06-06 ENCOUNTER — Other Ambulatory Visit: Payer: Self-pay | Admitting: Family Medicine

## 2023-06-06 DIAGNOSIS — Z1231 Encounter for screening mammogram for malignant neoplasm of breast: Secondary | ICD-10-CM

## 2023-06-07 ENCOUNTER — Encounter: Payer: Medicare Other | Admitting: Rehabilitative and Restorative Service Providers"

## 2023-06-07 DIAGNOSIS — K080 Exfoliation of teeth due to systemic causes: Secondary | ICD-10-CM | POA: Diagnosis not present

## 2023-06-09 ENCOUNTER — Encounter: Payer: Self-pay | Admitting: Rehabilitative and Restorative Service Providers"

## 2023-06-09 ENCOUNTER — Ambulatory Visit: Payer: Medicare Other | Admitting: Rehabilitative and Restorative Service Providers"

## 2023-06-09 DIAGNOSIS — R293 Abnormal posture: Secondary | ICD-10-CM

## 2023-06-09 DIAGNOSIS — R252 Cramp and spasm: Secondary | ICD-10-CM | POA: Diagnosis not present

## 2023-06-09 DIAGNOSIS — M6281 Muscle weakness (generalized): Secondary | ICD-10-CM | POA: Diagnosis not present

## 2023-06-09 NOTE — Therapy (Signed)
OUTPATIENT PHYSICAL THERAPY TREATMENT NOTE   Patient Name: Sandra Day MRN: [id number] DOB:[birth year], 77 y.o., female Today's Date: 06/09/2023    END OF SESSION:  PT End of Session - 06/09/23 1018     Visit Number 22    Date for PT Re-Evaluation 07/01/23    Authorization Type BC/BS Medicare    Progress Note Due on Visit 30    PT Start Time 1015    PT Stop Time 1055    PT Time Calculation (min) 40 min    Activity Tolerance Patient tolerated treatment well    Behavior During Therapy [facility] for tasks assessed/performed              Past Medical History:  Diagnosis Date   Anxiety    Arthritis of both knees 03/22/2017   Asthma    as a child, no issues in adulthood   Depression    Fatty liver    GERD (gastroesophageal reflux disease)    Hypertension    Multiple thyroid nodules    Osteoporosis 03/22/2017   Pancytopenia (HCC) 03/22/2017   Past Surgical History:  Procedure Laterality Date   ANTERIOR CERVICAL DECOMP/DISCECTOMY FUSION N/A 02/21/2023   Procedure: Cervical Three-Cervical Four, Cervical Four - Cervical Five, Cervical Five-Cervical Six  Anterior Cervical Discectomy Fusion;  Surgeon: Barnett Abu, MD;  Location: [facility] OR;  Service: Neurosurgery;  Laterality: N/A;  RM 21 3C   KNEE SURGERY Left    Patient Active Problem List   Diagnosis Date Noted   Cervical spondylosis with myelopathy 02/21/2023   Pancytopenia (HCC) 03/22/2017   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   Arthritis of both knees 03/22/2017   Osteoporosis 03/22/2017    PCP: Sigmund Hazel, MD  REFERRING PROVIDER: Barnett Abu, MD  REFERRING DIAG: G95.9 (ICD-10-CM) - Disease of spinal cord, unspecified  THERAPY DIAG:  Muscle weakness (generalized)  Cramp and spasm  Abnormal posture  Rationale for Evaluation and Treatment: Rehabilitation  ONSET DATE: Anterior cervical decompression C3-4 C4-5 C5-6 on 02/21/2023  SUBJECTIVE:                                                                                                                                                                                                          SUBJECTIVE STATEMENT: Pt reports that the dog that she was fostering was starting to feel better, so he was pulling a little more on the leash which did cause some increased pain.  Hand dominance: Right  PERTINENT HISTORY:  Anterior cervical decompression C3-4 C4-5 C5-6 on 02/21/2023  PAIN:  Are you having  pain? Yes: NPRS scale: currently 4/10 Pain location: cervical and right UE Pain description: sore Aggravating factors: movement Relieving factors: rest  PRECAUTIONS: None  RED FLAGS: None     WEIGHT BEARING RESTRICTIONS: No  FALLS:  Has patient fallen in last 6 months? 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  NEXT MD VISIT: 2 months with Dr Danielle Dess  OBJECTIVE:   DIAGNOSTIC FINDINGS:  Cervical Radiograph on 02/21/2023: FINDINGS: Cross-table lateral images were obtained for surgical planning purposes. These demonstrate placement of a metallic marker overlying the Z6-1 disc space. Second image demonstrates placement of an ACDF with intervertebral spacers at C3-6.  Cervical CT Scan on 03/24/2023: IMPRESSION: 1. Uncomplicated C3-C6 ACDF with bridging bone seen through each cage. 2. No interval adjacent segment degeneration when compared to preoperative MRI. 3. Heterogeneous enlargement the left lobe thyroid. Recommend thyroid ultrasound (ref: J Am Coll Radiol. 2015 Feb;12(2): 143-50).  PATIENT SURVEYS:  Eval:  Quick Sharilyn Sites 40.91 05/03/2023:  Quick DASH 27.27   COGNITION: Overall cognitive status: Within  functional limits for tasks assessed  SENSATION: Reports minimal numbness and tingling in right hand, but has improved since before surgery  POSTURE: rounded shoulders  CERVICAL ROM:   Active ROM A/ROM (deg) eval  Flexion 47  Extension 45  Right lateral flexion 32  Left lateral flexion 40  Right rotation 62  Left rotation 65   (Blank rows = not tested)  UPPER EXTREMITY ROM:  Active ROM Right eval Right 04/07/23 Right 04/19/23 Right 05/03/23 Right 05/31/23 Left eval  Shoulder flexion 35 57 60 78 86 160  Shoulder extension 65 65 65 68 74 80  Shoulder abduction 55 60 85 88 100 155  Shoulder adduction        Shoulder internal rotation        Shoulder external rotation         (Blank rows = not tested)  UPPER EXTREMITY MMT:  MMT Right eval Right 04/26/23 Right 05/31/23 Left eval  Shoulder flexion 2 2+ 3- 5  Shoulder extension 3 3+ 3+ 5  Shoulder abduction 2 2+ 3- 5  Grip strength 40 lbs 47 lbs 52 lbs 47 lbs   (Blank rows = not tested)   TODAY'S TREATMENT:                                                                                                                               DATE: 06/09/2023 Nustep level 6 x6 min with PT present to discuss status Standing facing wall performing wall CW and CCW circles with hand on towel 2x10 each Standing 3 way scapular stabilization  with green loop x10 bilat Standing shoulder ER with green loop 2x10 Supine shoulder flexion with 2# 2x10 Supine serratus punch 2# 2x10 Left side-lying right shoulder abduction 2x10 Prone with 1#:  shoulder flexion, shoulder extension, shoulder row, shoulder horizontal abduction.  Right UE 2x10 each Standing triceps pressdown 10# 2x10   DATE: 06/02/2023 Nustep level 5 x6 min with PT present to discuss status Standing facing wall performing wall CW and CCW circles with hand on towel 2x10 each Standing 3 way scapular stabilization with green loop x10 bilat Standing shoulder ER with green loop  2x10 Wall push-up 2x10 Supine shoulder flexion with 2# 2x10 Supine serratus punch 2# 2x10 Left side-lying right shoulder abduction 2x10 Prone with 1#:  shoulder flexion, shoulder extension, shoulder row.  Right UE 2x10 each   DATE: 05/31/2023 Nustep level 5 x6 min with PT present to discuss status Standing facing wall performing wall CW and CCW circles with hand on towel 2x10 each Standing 3 way scapular stabilization with green loop x10 bilat Standing shoulder ER with green loop 2x10 Wall push-up 2x10 Supine shoulder flexion with 1# 2x10 RUE Supine chest press with 1# 2x10 RUE Supine cervical retraction 2x10 Left side-lying right shoulder abduction 2x10 Prone with 1#:  shoulder flexion, shoulder extension, shoulder row.  Right UE 2x10 each      PATIENT EDUCATION:  Education details: Issued HEP Person educated: Patient Education method: Explanation, Demonstration, and Handouts Education comprehension: verbalized understanding and returned demonstration  HOME EXERCISE PROGRAM: Access Code: [email or web address] URL: [email or web address] Date: 04/07/2023 Prepared by: Reather Laurence  Exercises - Seated Shoulder Flexion Towel Slide at Table Top  - 1 x daily - 7 x weekly - 2 sets - 10 reps - Seated Shoulder Abduction Towel Slide at Table Top  - 1 x daily - 7 x weekly - 2 sets - 10 reps - Shoulder Flexion Wall Slide with Towel  - 1 x daily - 7 x weekly - 2 sets - 10 reps - Supine Shoulder Flexion Extension AAROM with Dowel  - 1 x daily - 7 x weekly - 2 sets - 10 reps - Supine Chest Press with Dumbbells  - 1 x daily - 7 x weekly - 2 sets - 10 reps - Supine Shoulder Press AAROM in Abduction with Dowel  - 1 x daily - 7 x weekly - 2 sets - 10 reps - Sidelying Shoulder External Rotation  - 1 x daily - 7 x weekly - 2 sets - 10 reps - Standing Shoulder Row with Anchored Resistance  - 1 x daily - 7 x weekly - 2 sets - 10 reps - Shoulder extension with resistance - Neutral  - 1 x daily  - 7 x weekly - 2 sets - 10 reps  ASSESSMENT:  CLINICAL IMPRESSION: Ms Gabehart presents to skilled PT reporting some increased cervical pain today secondary to the dog that she was temporarily fostering feeling better from his illness and pulling more on the leash.  Patient able to progress through session without complaints of increased pain.  Patient continues to report compliance with HEP.  Patient will be going out of town tomorrow for an extended weekend trip.  Patient continues to require skilled PT to continue to progress towards goal related activities.  OBJECTIVE IMPAIRMENTS: decreased ROM, decreased strength, increased muscle spasms, impaired flexibility, postural dysfunction, and pain.   ACTIVITY LIMITATIONS: carrying, lifting, reach over head, and hygiene/grooming  PARTICIPATION LIMITATIONS: meal prep, cleaning, laundry, community activity, and occupation  PERSONAL FACTORS: Fitness  and 1 comorbidity: s/p cervical ACDF on 02/21/2023  are also affecting patient's functional outcome.   REHAB POTENTIAL: Good  CLINICAL DECISION MAKING: Stable/uncomplicated  EVALUATION COMPLEXITY: Low   GOALS: Goals reviewed with patient? Yes  SHORT TERM GOALS: Target date: 04/01/2023  Pt will be independent with initial HEP. Baseline:  Goal status: MET  2.  Pt will report at least 25% decrease in symptoms. Baseline:  Goal status: MET   LONG TERM GOALS: Target date: 07/01/2023  Pt will be independent with initial HEP. Baseline:  Goal status: Ongoing  2.  Pt will increase Quick DASH to no greater than 25 to demonstrate her ability to complete functional tasks. Baseline: 40.91 Goal status: Ongoing (see above)  3.  Pt will increase right shoulder strength to at least 4/5 to allow her to lift objects with her right hand and use for ADLs/IADLs. Baseline: 2/5 Goal status: Ongoing (see above)  4.  Patient will increase her right shoulder A/ROM to at least 110 degrees flexion and abduction  to allow her to reach into overhead cabinets. Baseline: see above Goal status: Ongoing (see above)  5.  Patient will increase right grip strength to greater than 50 pounds to allow her to grab and hold items in the home. Baseline: 40 lbs Goal status: Met 05/31/2023  6.  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 Baseline:  Goal status: Ongoing   PLAN:  PT FREQUENCY: 2x/week  PT DURATION: 8 weeks  PLANNED INTERVENTIONS: Therapeutic exercises, Therapeutic activity, Neuromuscular re-education, Balance training, Gait training, Patient/Family education, Self Care, Joint mobilization, Joint manipulation, Aquatic Therapy, Dry Needling, Spinal manipulation, Spinal mobilization, Cryotherapy, Moist heat, scar mobilization, Taping, Traction, Ultrasound, Ionotophoresis 4mg /ml Dexamethasone, Manual therapy, and Re-evaluation  PLAN FOR NEXT SESSION: Shoulder stability, periscapular strengthening    Reather Laurence, PT, DPT 06/09/23, 11:00 AM  [facility] [address] [place], [place] [address] Phone # [phone] Fax [phone]

## 2023-06-13 ENCOUNTER — Encounter: Payer: Medicare Other | Admitting: Rehabilitative and Restorative Service Providers"

## 2023-06-15 ENCOUNTER — Encounter: Payer: Medicare Other | Admitting: Rehabilitative and Restorative Service Providers"

## 2023-06-15 DIAGNOSIS — G959 Disease of spinal cord, unspecified: Secondary | ICD-10-CM | POA: Diagnosis not present

## 2023-06-17 DIAGNOSIS — D2271 Melanocytic nevi of right lower limb, including hip: Secondary | ICD-10-CM | POA: Diagnosis not present

## 2023-06-17 DIAGNOSIS — L821 Other seborrheic keratosis: Secondary | ICD-10-CM | POA: Diagnosis not present

## 2023-06-17 DIAGNOSIS — L905 Scar conditions and fibrosis of skin: Secondary | ICD-10-CM | POA: Diagnosis not present

## 2023-06-17 DIAGNOSIS — D485 Neoplasm of uncertain behavior of skin: Secondary | ICD-10-CM | POA: Diagnosis not present

## 2023-06-17 DIAGNOSIS — L570 Actinic keratosis: Secondary | ICD-10-CM | POA: Diagnosis not present

## 2023-06-17 DIAGNOSIS — D2272 Melanocytic nevi of left lower limb, including hip: Secondary | ICD-10-CM | POA: Diagnosis not present

## 2023-06-17 DIAGNOSIS — D2262 Melanocytic nevi of left upper limb, including shoulder: Secondary | ICD-10-CM | POA: Diagnosis not present

## 2023-06-17 DIAGNOSIS — L820 Inflamed seborrheic keratosis: Secondary | ICD-10-CM | POA: Diagnosis not present

## 2023-06-21 ENCOUNTER — Encounter: Payer: Self-pay | Admitting: Rehabilitative and Restorative Service Providers"

## 2023-06-21 ENCOUNTER — Ambulatory Visit: Payer: Medicare Other | Admitting: Rehabilitative and Restorative Service Providers"

## 2023-06-21 DIAGNOSIS — R252 Cramp and spasm: Secondary | ICD-10-CM | POA: Diagnosis not present

## 2023-06-21 DIAGNOSIS — R293 Abnormal posture: Secondary | ICD-10-CM | POA: Diagnosis not present

## 2023-06-21 DIAGNOSIS — M6281 Muscle weakness (generalized): Secondary | ICD-10-CM | POA: Diagnosis not present

## 2023-06-21 NOTE — Therapy (Signed)
OUTPATIENT PHYSICAL THERAPY TREATMENT NOTE   Patient Name: Sandra Day MRN: [id number] DOB:31-Oct-1945, 77 y.o., female Today's Date: 06/21/2023    END OF SESSION:  PT End of Session - 06/21/23 1020     Visit Number 23    Date for PT Re-Evaluation 07/01/23    Authorization Type BC/BS Medicare    Progress Note Due on Visit 30    PT Start Time 1015    PT Stop Time 1055    PT Time Calculation (min) 40 min    Activity Tolerance Patient tolerated treatment well    Behavior During Therapy [facility] for tasks assessed/performed              Past Medical History:  Diagnosis Date   Anxiety    Arthritis of both knees 03/22/2017   Asthma    as a child, no issues in adulthood   Depression    Fatty liver    GERD (gastroesophageal reflux disease)    Hypertension    Multiple thyroid nodules    Osteoporosis 03/22/2017   Pancytopenia (HCC) 03/22/2017   Past Surgical History:  Procedure Laterality Date   ANTERIOR CERVICAL DECOMP/DISCECTOMY FUSION N/A 02/21/2023   Procedure: Cervical Three-Cervical Four, Cervical Four - Cervical Five, Cervical Five-Cervical Six  Anterior Cervical Discectomy Fusion;  Surgeon: Barnett Abu, MD;  Location: [facility] OR;  Service: Neurosurgery;  Laterality: N/A;  RM 21 3C   KNEE SURGERY Left    Patient Active Problem List   Diagnosis Date Noted   Cervical spondylosis with myelopathy 02/21/2023   Pancytopenia (HCC) 03/22/2017   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   Arthritis of both knees 03/22/2017   Osteoporosis 03/22/2017    PCP: Sigmund Hazel, MD  REFERRING PROVIDER: Barnett Abu, MD  REFERRING DIAG: G95.9 (ICD-10-CM) - Disease of spinal cord, unspecified  THERAPY DIAG:  Muscle weakness (generalized)  Cramp and spasm  Abnormal posture  Rationale for Evaluation and Treatment: Rehabilitation  ONSET DATE: Anterior cervical decompression C3-4 C4-5 C5-6 on 02/21/2023  SUBJECTIVE:                                                                                                                                                                                                          SUBJECTIVE STATEMENT: States that she saw Dr Danielle Dess last week and he was able to discharge her from his care to only follow up if needed.  Hand dominance: Right  PERTINENT HISTORY:  Anterior cervical decompression C3-4 C4-5 C5-6 on 02/21/2023  PAIN:  Are you having pain? Yes: NPRS scale: currently 2-4/10  Pain location: cervical and right UE Pain description: sore Aggravating factors: movement Relieving factors: rest  PRECAUTIONS: None  RED FLAGS: None     WEIGHT BEARING RESTRICTIONS: No  FALLS:  Has patient fallen in last 6 months? 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  NEXT MD VISIT: 2 months with Dr Danielle Dess  OBJECTIVE:   DIAGNOSTIC FINDINGS:  Cervical Radiograph on 02/21/2023: FINDINGS: Cross-table lateral images were obtained for surgical planning purposes. These demonstrate placement of a metallic marker overlying the R6-0 disc space. Second image demonstrates placement of an ACDF with intervertebral spacers at C3-6.  Cervical CT Scan on 03/24/2023: IMPRESSION: 1. Uncomplicated C3-C6 ACDF with bridging bone seen through each cage. 2. No interval adjacent segment degeneration when compared to preoperative MRI. 3. Heterogeneous enlargement the left lobe thyroid. Recommend thyroid ultrasound (ref: J Am Coll Radiol. 2015 Feb;12(2): 143-50).  PATIENT SURVEYS:  Eval:  Quick Sharilyn Sites 40.91 05/03/2023:  Quick DASH 27.27   COGNITION: Overall cognitive status: Within functional limits for tasks  assessed  SENSATION: Reports minimal numbness and tingling in right hand, but has improved since before surgery  POSTURE: rounded shoulders  CERVICAL ROM:   Active ROM A/ROM (deg) eval  Flexion 47  Extension 45  Right lateral flexion 32  Left lateral flexion 40  Right rotation 62  Left rotation 65   (Blank rows = not tested)  UPPER EXTREMITY ROM:  Active ROM Right eval Right 04/07/23 Right 04/19/23 Right 05/03/23 Right 05/31/23 Left eval  Shoulder flexion 35 57 60 78 86 160  Shoulder extension 65 65 65 68 74 80  Shoulder abduction 55 60 85 88 100 155  Shoulder adduction        Shoulder internal rotation        Shoulder external rotation         (Blank rows = not tested)  UPPER EXTREMITY MMT:  MMT Right eval Right 04/26/23 Right 05/31/23 Left eval  Shoulder flexion 2 2+ 3- 5  Shoulder extension 3 3+ 3+ 5  Shoulder abduction 2 2+ 3- 5  Grip strength 40 lbs 47 lbs 52 lbs 47 lbs   (Blank rows = not tested)   TODAY'S TREATMENT:                                                                                                                               DATE: 06/21/2023 Nustep level 5 x6 min with PT present to discuss status Standing 4D scapular stabilization with 2# light blue plyoball on wall x20 bilat Standing lat pulldown 25# 2x10 Standing wall push-up 2x10 Prone with 1#:  shoulder flexion, shoulder extension, shoulder row, shoulder horizontal abduction.  Right UE 2x10 each Left side-lying right shoulder abduction 2x10 Supine serratus punch 3# 2x10 Supine shoulder flexion with 3# 2x10 Supine shoulder ER and horizontal abduction with red tband 2x10 each   DATE: 06/09/2023 Nustep level 6 x6 min with PT present to discuss status Standing facing wall performing wall CW and CCW circles with hand on towel 2x10 each Standing 3 way scapular stabilization with green loop x10 bilat Standing shoulder ER with green loop 2x10 Supine shoulder flexion with 2# 2x10 Supine  serratus punch 2# 2x10 Left side-lying right shoulder abduction 2x10 Prone with 1#:  shoulder flexion, shoulder extension, shoulder row, shoulder horizontal abduction.  Right UE 2x10 each Standing triceps pressdown 10# 2x10   DATE: 06/02/2023 Nustep level 5 x6 min with PT present to discuss status Standing facing wall performing wall CW and CCW circles with hand on towel 2x10 each Standing 3 way scapular stabilization with green loop x10 bilat Standing shoulder ER with green loop 2x10 Wall push-up 2x10 Supine shoulder flexion with 2# 2x10 Supine serratus punch 2# 2x10 Left side-lying right shoulder abduction 2x10 Prone with 1#:  shoulder flexion, shoulder extension, shoulder row.  Right UE 2x10 each    PATIENT EDUCATION:  Education details: Issued HEP Person educated: Patient Education method: Explanation, Demonstration, and Handouts Education comprehension: verbalized understanding and returned demonstration  HOME EXERCISE PROGRAM: Access Code: [email or web address] URL: [email or web address] Date: 04/07/2023 Prepared by: Reather Laurence  Exercises - Seated Shoulder Flexion Towel Slide at Table Top  - 1 x daily - 7 x weekly - 2 sets - 10 reps - Seated Shoulder Abduction Towel Slide at Table Top  - 1 x daily - 7 x weekly - 2 sets - 10 reps - Shoulder Flexion Wall Slide with Towel  - 1 x daily - 7 x weekly - 2 sets - 10 reps - Supine Shoulder Flexion Extension AAROM with Dowel  - 1 x daily - 7 x weekly - 2 sets - 10 reps - Supine Chest Press with Dumbbells  - 1 x daily - 7 x weekly - 2 sets - 10 reps - Supine Shoulder Press AAROM in Abduction with Dowel  - 1 x daily - 7 x weekly - 2 sets - 10 reps - Sidelying Shoulder External Rotation  - 1 x daily - 7 x weekly - 2 sets - 10 reps - Standing Shoulder Row with Anchored Resistance  - 1 x daily - 7 x weekly - 2 sets - 10 reps - Shoulder extension with resistance - Neutral  - 1 x daily - 7 x weekly - 2 sets - 10  reps  ASSESSMENT:  CLINICAL IMPRESSION: Ms Corti presents to skilled PT with continued progress with goals.  Patient able to progress with weights during session and states that she did well while on vacation and that Dr Danielle Dess was pleased with her progress.  Patient able to initiate strengthening on weight equipment with lat pull-down today.  Patient able to increase weight with dumbbells today.  Patient on track for possible discharge next week.  OBJECTIVE IMPAIRMENTS: decreased ROM, decreased strength, increased muscle spasms, impaired flexibility, postural dysfunction, and pain.   ACTIVITY LIMITATIONS: carrying, lifting, reach over head, and hygiene/grooming  PARTICIPATION LIMITATIONS: meal prep, cleaning, laundry, community activity, and occupation  PERSONAL FACTORS: Fitness and 1 comorbidity: s/p cervical ACDF on 02/21/2023  are also affecting patient's functional outcome.   REHAB POTENTIAL: Good  CLINICAL DECISION MAKING: Stable/uncomplicated  EVALUATION COMPLEXITY: Low   GOALS: Goals reviewed with patient? Yes  SHORT TERM GOALS: Target date: 04/01/2023  Pt will be independent with initial HEP. Baseline:  Goal status: MET  2.  Pt will report at least 25% decrease in symptoms. Baseline:  Goal status: MET   LONG TERM GOALS: Target date: 07/01/2023  Pt will be independent with initial HEP. Baseline:  Goal status: Ongoing  2.  Pt will increase Quick DASH to no greater than 25 to demonstrate her ability to complete functional tasks. Baseline: 40.91 Goal status: Ongoing (see above)  3.  Pt will increase right shoulder strength to at least 4/5 to allow her to lift objects with her right hand and use for ADLs/IADLs. Baseline: 2/5 Goal status: Ongoing (see above)  4.  Patient will increase her right shoulder A/ROM to at least 110 degrees flexion and abduction to allow her to reach into overhead cabinets. Baseline: see above Goal status: Ongoing (see above)  5.   Patient will increase right grip strength to greater than 50 pounds to allow her to grab and hold items in the home. Baseline: 40 lbs Goal status: Met 05/31/2023  6.  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 Baseline:  Goal status: Ongoing   PLAN:  PT FREQUENCY: 2x/week  PT DURATION: 8 weeks  PLANNED INTERVENTIONS: Therapeutic exercises, Therapeutic activity, Neuromuscular re-education, Balance training, Gait training, Patient/Family education, Self Care, Joint mobilization, Joint manipulation, Aquatic Therapy, Dry Needling, Spinal manipulation, Spinal mobilization, Cryotherapy, Moist heat, scar mobilization, Taping, Traction, Ultrasound, Ionotophoresis 4mg /ml Dexamethasone, Manual therapy, and Re-evaluation  PLAN FOR NEXT SESSION: Shoulder stability, periscapular strengthening    Reather Laurence, PT, DPT 06/21/23, 11:02 AM  [facility] [address], [address] [place], [place] [address] Phone # [phone] Fax [phone]

## 2023-06-23 ENCOUNTER — Encounter: Payer: Self-pay | Admitting: Rehabilitative and Restorative Service Providers"

## 2023-06-23 ENCOUNTER — Ambulatory Visit: Payer: Medicare Other | Admitting: Rehabilitative and Restorative Service Providers"

## 2023-06-23 DIAGNOSIS — R252 Cramp and spasm: Secondary | ICD-10-CM

## 2023-06-23 DIAGNOSIS — R293 Abnormal posture: Secondary | ICD-10-CM

## 2023-06-23 DIAGNOSIS — M6281 Muscle weakness (generalized): Secondary | ICD-10-CM | POA: Diagnosis not present

## 2023-06-23 NOTE — Therapy (Signed)
OUTPATIENT PHYSICAL THERAPY TREATMENT NOTE   Patient Name: Sandra Day MRN: [id number] DOB:07/05/1946, 77 y.o., female Today's Date: 06/23/2023    END OF SESSION:  PT End of Session - 06/23/23 1020     Visit Number 24    Date for PT Re-Evaluation 07/01/23    Authorization Type BC/BS Medicare    Progress Note Due on Visit 30    PT Start Time 1016    PT Stop Time 1055    PT Time Calculation (min) 39 min    Activity Tolerance Patient tolerated treatment well    Behavior During Therapy WFL for tasks assessed/performed              Past Medical History:  Diagnosis Date   Anxiety    Arthritis of both knees 03/22/2017   Asthma    as a child, no issues in adulthood   Depression    Fatty liver    GERD (gastroesophageal reflux disease)    Hypertension    Multiple thyroid nodules    Osteoporosis 03/22/2017   Pancytopenia (HCC) 03/22/2017   Past Surgical History:  Procedure Laterality Date   ANTERIOR CERVICAL DECOMP/DISCECTOMY FUSION N/A 02/21/2023   Procedure: Cervical Three-Cervical Four, Cervical Four - Cervical Five, Cervical Five-Cervical Six  Anterior Cervical Discectomy Fusion;  Surgeon: Barnett Abu, MD;  Location: [facility] OR;  Service: Neurosurgery;  Laterality: N/A;  RM 21 3C   KNEE SURGERY Left    Patient Active Problem List   Diagnosis Date Noted   Cervical spondylosis with myelopathy 02/21/2023   Pancytopenia (HCC) 03/22/2017   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   Arthritis of both knees 03/22/2017   Osteoporosis 03/22/2017    PCP: Sigmund Hazel, MD  REFERRING PROVIDER: Barnett Abu, MD  REFERRING DIAG: G95.9 (ICD-10-CM) - Disease of spinal cord, unspecified  THERAPY DIAG:  Muscle weakness (generalized)  Cramp and spasm  Abnormal posture  Rationale for Evaluation and Treatment: Rehabilitation  ONSET DATE: Anterior cervical decompression C3-4 C4-5 C5-6 on 02/21/2023  SUBJECTIVE:                                                                                                                                                                                                          SUBJECTIVE STATEMENT: Pt with no new complaints.  Hand dominance: Right  PERTINENT HISTORY:  Anterior cervical decompression C3-4 C4-5 C5-6 on 02/21/2023  PAIN:  Are you having pain? Yes: NPRS scale: currently 2/10 Pain location: cervical and right UE Pain description: sore Aggravating factors: movement Relieving factors: rest  PRECAUTIONS: None  RED FLAGS: None     WEIGHT BEARING RESTRICTIONS: No  FALLS:  Has patient fallen in last 6 months? 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  NEXT MD VISIT: 2 months with Dr Danielle Dess  OBJECTIVE:   DIAGNOSTIC FINDINGS:  Cervical Radiograph on 02/21/2023: FINDINGS: Cross-table lateral images were obtained for surgical planning purposes. These demonstrate placement of a metallic marker overlying the L2-4 disc space. Second image demonstrates placement of an ACDF with intervertebral spacers at C3-6.  Cervical CT Scan on 03/24/2023: IMPRESSION: 1. Uncomplicated C3-C6 ACDF with bridging bone seen through each cage. 2. No interval adjacent segment degeneration when compared to preoperative MRI. 3. Heterogeneous enlargement the left lobe thyroid. Recommend thyroid ultrasound (ref: J Am Coll Radiol. 2015 Feb;12(2): 143-50).  PATIENT SURVEYS:  Eval:  Quick Sharilyn Sites 40.91 05/03/2023:  Quick DASH 27.27   COGNITION: Overall cognitive status: Within functional limits for tasks assessed  SENSATION: Reports minimal numbness and tingling in right hand, but has improved since  before surgery  POSTURE: rounded shoulders  CERVICAL ROM:   Active ROM A/ROM (deg) eval  Flexion 47  Extension 45  Right lateral flexion 32  Left lateral flexion 40  Right rotation 62  Left rotation 65   (Blank rows = not tested)  UPPER EXTREMITY ROM:  Active ROM Right eval Right 04/07/23 Right 04/19/23 Right 05/03/23 Right 05/31/23 Right 06/23/23 Left eval  Shoulder flexion 35 57 60 78 86 145 160  Shoulder extension 65 65 65 68 74 82 80  Shoulder abduction 55 60 85 88 100 130 155  Shoulder adduction         Shoulder internal rotation         Shoulder external rotation          (Blank rows = not tested)  UPPER EXTREMITY MMT:  MMT Right eval Right 04/26/23 Right 05/31/23 Left eval  Shoulder flexion 2 2+ 3- 5  Shoulder extension 3 3+ 3+ 5  Shoulder abduction 2 2+ 3- 5  Grip strength 40 lbs 47 lbs 52 lbs 47 lbs   (Blank rows = not tested)   TODAY'S TREATMENT:                                                                                                                               DATE: 06/23/2023 Nustep level 5 x6 min with PT present to discuss status Standing 4D scapular stabilization with yellow plyoball on wall x20 bilat Standing lat pulldown 25# 2x10 Standing wall push-up 2x10 Prone with 2#:  shoulder flexion, shoulder extension, shoulder row, shoulder horizontal abduction.  Right UE 2x10  each Left side-lying right shoulder abduction 2x10 A/ROM measurements Supine serratus punch 3# 2x10 Supine shoulder flexion with 3# 2x10 Supine shoulder ER and horizontal abduction with red tband 2x10 each   DATE: 06/21/2023 Nustep level 5 x6 min with PT present to discuss status Standing 4D scapular stabilization with 2# light blue plyoball on wall x20 bilat Standing lat pulldown 25# 2x10 Standing wall push-up 2x10 Prone with 1#:  shoulder flexion, shoulder extension, shoulder row, shoulder horizontal abduction.  Right UE 2x10 each Left side-lying right shoulder  abduction 2x10 Supine serratus punch 3# 2x10 Supine shoulder flexion with 3# 2x10 Supine shoulder ER and horizontal abduction with red tband 2x10 each   DATE: 06/09/2023 Nustep level 6 x6 min with PT present to discuss status Standing facing wall performing wall CW and CCW circles with hand on towel 2x10 each Standing 3 way scapular stabilization with green loop x10 bilat Standing shoulder ER with green loop 2x10 Supine shoulder flexion with 2# 2x10 Supine serratus punch 2# 2x10 Left side-lying right shoulder abduction 2x10 Prone with 1#:  shoulder flexion, shoulder extension, shoulder row, shoulder horizontal abduction.  Right UE 2x10 each Standing triceps pressdown 10# 2x10    PATIENT EDUCATION:  Education details: Issued HEP Person educated: Patient Education method: Explanation, Demonstration, and Handouts Education comprehension: verbalized understanding and returned demonstration  HOME EXERCISE PROGRAM: Access Code: [email or web address] URL: [email or web address] Date: 04/07/2023 Prepared by: Reather Laurence  Exercises - Seated Shoulder Flexion Towel Slide at Table Top  - 1 x daily - 7 x weekly - 2 sets - 10 reps - Seated Shoulder Abduction Towel Slide at Table Top  - 1 x daily - 7 x weekly - 2 sets - 10 reps - Shoulder Flexion Wall Slide with Towel  - 1 x daily - 7 x weekly - 2 sets - 10 reps - Supine Shoulder Flexion Extension AAROM with Dowel  - 1 x daily - 7 x weekly - 2 sets - 10 reps - Supine Chest Press with Dumbbells  - 1 x daily - 7 x weekly - 2 sets - 10 reps - Supine Shoulder Press AAROM in Abduction with Dowel  - 1 x daily - 7 x weekly - 2 sets - 10 reps - Sidelying Shoulder External Rotation  - 1 x daily - 7 x weekly - 2 sets - 10 reps - Standing Shoulder Row with Anchored Resistance  - 1 x daily - 7 x weekly - 2 sets - 10 reps - Shoulder extension with resistance - Neutral  - 1 x daily - 7 x weekly - 2 sets - 10 reps  ASSESSMENT:  CLINICAL  IMPRESSION: Ms Wincek presents to skilled PT with reports of decreased pain today.  Patient is progressing with increased right shoulder A/ROM and is WFL at this time and able to demonstrate near the contralateral side.  Patient is progressing with strength and able to tolerate increased weight.  Patient is on track for discharge next week, as anticipated.  OBJECTIVE IMPAIRMENTS: decreased ROM, decreased strength, increased muscle spasms, impaired flexibility, postural dysfunction, and pain.   ACTIVITY LIMITATIONS: carrying, lifting, reach over head, and hygiene/grooming  PARTICIPATION LIMITATIONS: meal prep, cleaning, laundry, community activity, and occupation  PERSONAL FACTORS: Fitness and 1 comorbidity: s/p cervical ACDF on 02/21/2023  are also affecting patient's functional outcome.   REHAB POTENTIAL: Good  CLINICAL DECISION MAKING: Stable/uncomplicated  EVALUATION COMPLEXITY: Low   GOALS: Goals reviewed with patient? Yes  SHORT TERM GOALS: Target date: 04/01/2023  Pt will  be independent with initial HEP. Baseline:  Goal status: MET  2.  Pt will report at least 25% decrease in symptoms. Baseline:  Goal status: MET   LONG TERM GOALS: Target date: 07/01/2023  Pt will be independent with initial HEP. Baseline:  Goal status: Ongoing  2.  Pt will increase Quick DASH to no greater than 25 to demonstrate her ability to complete functional tasks. Baseline: 40.91 Goal status: Ongoing (see above)  3.  Pt will increase right shoulder strength to at least 4/5 to allow her to lift objects with her right hand and use for ADLs/IADLs. Baseline: 2/5 Goal status: Ongoing (see above)  4.  Patient will increase her right shoulder A/ROM to at least 110 degrees flexion and abduction to allow her to reach into overhead cabinets. Baseline: see above Goal status: Met on 06/23/2023  5.  Patient will increase right grip strength to greater than 50 pounds to allow her to grab and hold items in  the home. Baseline: 40 lbs Goal status: Met 05/31/2023  6.  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 Baseline:  Goal status: Ongoing   PLAN:  PT FREQUENCY: 2x/week  PT DURATION: 8 weeks  PLANNED INTERVENTIONS: Therapeutic exercises, Therapeutic activity, Neuromuscular re-education, Balance training, Gait training, Patient/Family education, Self Care, Joint mobilization, Joint manipulation, Aquatic Therapy, Dry Needling, Spinal manipulation, Spinal mobilization, Cryotherapy, Moist heat, scar mobilization, Taping, Traction, Ultrasound, Ionotophoresis 4mg /ml Dexamethasone, Manual therapy, and Re-evaluation  PLAN FOR NEXT SESSION: Shoulder stability, periscapular strengthening    Reather Laurence, PT, DPT 06/23/23, 10:59 AM  [facility] [address], [address] [place], [place] [address] Phone # [phone] Fax [phone]

## 2023-06-28 ENCOUNTER — Ambulatory Visit: Payer: Medicare Other | Admitting: Rehabilitative and Restorative Service Providers"

## 2023-06-28 ENCOUNTER — Encounter: Payer: Self-pay | Admitting: Rehabilitative and Restorative Service Providers"

## 2023-06-28 DIAGNOSIS — R293 Abnormal posture: Secondary | ICD-10-CM

## 2023-06-28 DIAGNOSIS — M6281 Muscle weakness (generalized): Secondary | ICD-10-CM

## 2023-06-28 DIAGNOSIS — R252 Cramp and spasm: Secondary | ICD-10-CM

## 2023-06-28 NOTE — Therapy (Signed)
OUTPATIENT PHYSICAL THERAPY TREATMENT NOTE   Patient Name: Sandra Day MRN: [id number] DOB:1946-04-06, 77 y.o., female Today's Date: 06/28/2023    END OF SESSION:  PT End of Session - 06/28/23 1058     Visit Number 25    Date for PT Re-Evaluation 07/01/23    Authorization Type BC/BS Medicare    Progress Note Due on Visit 30    PT Start Time 1057    PT Stop Time 1135    PT Time Calculation (min) 38 min    Activity Tolerance Patient tolerated treatment well    Behavior During Therapy WFL for tasks assessed/performed              Past Medical History:  Diagnosis Date   Anxiety    Arthritis of both knees 03/22/2017   Asthma    as a child, no issues in adulthood   Depression    Fatty liver    GERD (gastroesophageal reflux disease)    Hypertension    Multiple thyroid nodules    Osteoporosis 03/22/2017   Pancytopenia (HCC) 03/22/2017   Past Surgical History:  Procedure Laterality Date   ANTERIOR CERVICAL DECOMP/DISCECTOMY FUSION N/A 02/21/2023   Procedure: Cervical Three-Cervical Four, Cervical Four - Cervical Five, Cervical Five-Cervical Six  Anterior Cervical Discectomy Fusion;  Surgeon: Barnett Abu, MD;  Location: [facility] OR;  Service: Neurosurgery;  Laterality: N/A;  RM 21 3C   KNEE SURGERY Left    Patient Active Problem List   Diagnosis Date Noted   Cervical spondylosis with myelopathy 02/21/2023   Pancytopenia (HCC) 03/22/2017   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   Arthritis of both knees 03/22/2017   Osteoporosis 03/22/2017    PCP: Sigmund Hazel, MD  REFERRING PROVIDER: Barnett Abu, MD  REFERRING DIAG: G95.9 (ICD-10-CM) - Disease of spinal cord, unspecified  THERAPY DIAG:  Muscle weakness (generalized)  Cramp and spasm  Abnormal posture  Rationale for Evaluation and Treatment: Rehabilitation  ONSET DATE: Anterior cervical decompression C3-4 C4-5 C5-6 on 02/21/2023  SUBJECTIVE:                                                                                                                                                                                                          SUBJECTIVE STATEMENT: Pt with no new complaints.  Hand dominance: Right  PERTINENT HISTORY:  Anterior cervical decompression C3-4 C4-5 C5-6 on 02/21/2023  PAIN:  Are you having pain? Yes: NPRS scale:  2/10 Pain location: cervical and right UE Pain description: sore Aggravating factors: movement Relieving factors: rest  PRECAUTIONS: None  RED FLAGS: None     WEIGHT BEARING RESTRICTIONS: No  FALLS:  Has patient fallen in last 6 months? 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  NEXT MD VISIT: 2 months with Dr Danielle Dess  OBJECTIVE:   DIAGNOSTIC FINDINGS:  Cervical Radiograph on 02/21/2023: FINDINGS: Cross-table lateral images were obtained for surgical planning purposes. These demonstrate placement of a metallic marker overlying the O9-6 disc space. Second image demonstrates placement of an ACDF with intervertebral spacers at C3-6.  Cervical CT Scan on 03/24/2023: IMPRESSION: 1. Uncomplicated C3-C6 ACDF with bridging bone seen through each cage. 2. No interval adjacent segment degeneration when compared to preoperative MRI. 3. Heterogeneous enlargement the left lobe thyroid. Recommend thyroid ultrasound (ref: J Am Coll Radiol. 2015 Feb;12(2): 143-50).  PATIENT SURVEYS:  Eval:  Quick Sharilyn Sites 40.91 05/03/2023:  Quick DASH 27.27   COGNITION: Overall cognitive status: Within functional limits for tasks assessed  SENSATION: Reports minimal numbness and tingling in right hand, but has improved since before  surgery  POSTURE: rounded shoulders  CERVICAL ROM:   Active ROM A/ROM (deg) eval  Flexion 47  Extension 45  Right lateral flexion 32  Left lateral flexion 40  Right rotation 62  Left rotation 65   (Blank rows = not tested)  UPPER EXTREMITY ROM:  Active ROM Right eval Right 04/07/23 Right 04/19/23 Right 05/03/23 Right 05/31/23 Right 06/23/23 Left eval  Shoulder flexion 35 57 60 78 86 145 160  Shoulder extension 65 65 65 68 74 82 80  Shoulder abduction 55 60 85 88 100 130 155  Shoulder adduction         Shoulder internal rotation         Shoulder external rotation          (Blank rows = not tested)  UPPER EXTREMITY MMT:  MMT Right eval Right 04/26/23 Right 05/31/23 Left eval  Shoulder flexion 2 2+ 3- 5  Shoulder extension 3 3+ 3+ 5  Shoulder abduction 2 2+ 3- 5  Grip strength 40 lbs 47 lbs 52 lbs 47 lbs   (Blank rows = not tested)   TODAY'S TREATMENT:                                                                                                                               DATE: 06/28/2023 Nustep level 5 x7 min with PT present to discuss status Standing 4D scapular stabilization with yellow plyoball on wall x20 bilat Standing lat pulldown 25# 2x10 Standing wall push-up 2x10 Standing triceps pressdown 10# 2x10 Prone with 2#:  shoulder flexion, shoulder extension, shoulder row, shoulder horizontal  abduction.  Right UE 2x10 each Supine serratus punch 3# 2x10 Supine shoulder flexion with 3# 2x10 Left side-lying right shoulder abduction 2x10 Supine shoulder ER and horizontal abduction with red tband 2x10 each   DATE: 06/23/2023 Nustep level 5 x6 min with PT present to discuss status Standing 4D scapular stabilization with yellow plyoball on wall x20 bilat Standing lat pulldown 25# 2x10 Standing wall push-up 2x10 Prone with 2#:  shoulder flexion, shoulder extension, shoulder row, shoulder horizontal abduction.  Right UE 2x10 each Left side-lying right shoulder  abduction 2x10 A/ROM measurements Supine serratus punch 3# 2x10 Supine shoulder flexion with 3# 2x10 Supine shoulder ER and horizontal abduction with red tband 2x10 each   DATE: 06/21/2023 Nustep level 5 x6 min with PT present to discuss status Standing 4D scapular stabilization with 2# light blue plyoball on wall x20 bilat Standing lat pulldown 25# 2x10 Standing wall push-up 2x10 Prone with 1#:  shoulder flexion, shoulder extension, shoulder row, shoulder horizontal abduction.  Right UE 2x10 each Left side-lying right shoulder abduction 2x10 Supine serratus punch 3# 2x10 Supine shoulder flexion with 3# 2x10 Supine shoulder ER and horizontal abduction with red tband 2x10 each     PATIENT EDUCATION:  Education details: Issued HEP Person educated: Patient Education method: Explanation, Demonstration, and Handouts Education comprehension: verbalized understanding and returned demonstration  HOME EXERCISE PROGRAM: Access Code: [email or web address] URL: [email or web address] Date: 04/07/2023 Prepared by: Reather Laurence  Exercises - Seated Shoulder Flexion Towel Slide at Table Top  - 1 x daily - 7 x weekly - 2 sets - 10 reps - Seated Shoulder Abduction Towel Slide at Table Top  - 1 x daily - 7 x weekly - 2 sets - 10 reps - Shoulder Flexion Wall Slide with Towel  - 1 x daily - 7 x weekly - 2 sets - 10 reps - Supine Shoulder Flexion Extension AAROM with Dowel  - 1 x daily - 7 x weekly - 2 sets - 10 reps - Supine Chest Press with Dumbbells  - 1 x daily - 7 x weekly - 2 sets - 10 reps - Supine Shoulder Press AAROM in Abduction with Dowel  - 1 x daily - 7 x weekly - 2 sets - 10 reps - Sidelying Shoulder External Rotation  - 1 x daily - 7 x weekly - 2 sets - 10 reps - Standing Shoulder Row with Anchored Resistance  - 1 x daily - 7 x weekly - 2 sets - 10 reps - Shoulder extension with resistance - Neutral  - 1 x daily - 7 x weekly - 2 sets - 10 reps  ASSESSMENT:  CLINICAL  IMPRESSION: Ms Baumbach presents to skilled PT with no new complaints.  Patient states that she feels ready for discharge next visit.  Patient continues to progress with strengthening and continued increased right shoulder A/ROM.  Patient only requires very occasional/rare cuing for technique/posture.  Patient to be discharged next visit.  OBJECTIVE IMPAIRMENTS: decreased ROM, decreased strength, increased muscle spasms, impaired flexibility, postural dysfunction, and pain.   ACTIVITY LIMITATIONS: carrying, lifting, reach over head, and hygiene/grooming  PARTICIPATION LIMITATIONS: meal prep, cleaning, laundry, community activity, and occupation  PERSONAL FACTORS: Fitness and 1 comorbidity: s/p cervical ACDF on 02/21/2023  are also affecting patient's functional outcome.   REHAB POTENTIAL: Good  CLINICAL DECISION MAKING: Stable/uncomplicated  EVALUATION COMPLEXITY: Low   GOALS: Goals reviewed with patient? Yes  SHORT TERM GOALS: Target date: 04/01/2023  Pt will be independent with initial HEP. Baseline:  Goal  status: MET  2.  Pt will report at least 25% decrease in symptoms. Baseline:  Goal status: MET   LONG TERM GOALS: Target date: 07/01/2023  Pt will be independent with initial HEP. Baseline:  Goal status: Ongoing  2.  Pt will increase Quick DASH to no greater than 25 to demonstrate her ability to complete functional tasks. Baseline: 40.91 Goal status: Ongoing (see above)  3.  Pt will increase right shoulder strength to at least 4/5 to allow her to lift objects with her right hand and use for ADLs/IADLs. Baseline: 2/5 Goal status: Ongoing (see above)  4.  Patient will increase her right shoulder A/ROM to at least 110 degrees flexion and abduction to allow her to reach into overhead cabinets. Baseline: see above Goal status: Met on 06/23/2023  5.  Patient will increase right grip strength to greater than 50 pounds to allow her to grab and hold items in the  home. Baseline: 40 lbs Goal status: Met 05/31/2023  6.  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 Baseline:  Goal status: Ongoing   PLAN:  PT FREQUENCY: 2x/week  PT DURATION: 8 weeks  PLANNED INTERVENTIONS: Therapeutic exercises, Therapeutic activity, Neuromuscular re-education, Balance training, Gait training, Patient/Family education, Self Care, Joint mobilization, Joint manipulation, Aquatic Therapy, Dry Needling, Spinal manipulation, Spinal mobilization, Cryotherapy, Moist heat, scar mobilization, Taping, Traction, Ultrasound, Ionotophoresis 4mg /ml Dexamethasone, Manual therapy, and Re-evaluation  PLAN FOR NEXT SESSION: Discharge Visit    Reather Laurence, PT, DPT 06/28/23, 11:41 AM  [facility] [address], [address] [place], [place] [address] Phone # [phone] Fax [phone]

## 2023-06-30 ENCOUNTER — Encounter: Payer: Self-pay | Admitting: Rehabilitative and Restorative Service Providers"

## 2023-06-30 ENCOUNTER — Ambulatory Visit: Payer: Medicare Other | Admitting: Rehabilitative and Restorative Service Providers"

## 2023-06-30 DIAGNOSIS — M6281 Muscle weakness (generalized): Secondary | ICD-10-CM | POA: Diagnosis not present

## 2023-06-30 DIAGNOSIS — R293 Abnormal posture: Secondary | ICD-10-CM | POA: Diagnosis not present

## 2023-06-30 DIAGNOSIS — R252 Cramp and spasm: Secondary | ICD-10-CM

## 2023-06-30 NOTE — Therapy (Signed)
OUTPATIENT PHYSICAL THERAPY TREATMENT NOTE AND DISCHARGE SUMMARY   Patient Name: Sandra Day MRN: [id number] DOB:March 05, 1946, 77 y.o., female Today's Date: 06/30/2023    END OF SESSION:  PT End of Session - 06/30/23 1102     Visit Number 26    Date for PT Re-Evaluation 07/01/23    Authorization Type BC/BS Medicare    Progress Note Due on Visit 30    PT Start Time 1057    PT Stop Time 1135    PT Time Calculation (min) 38 min    Activity Tolerance Patient tolerated treatment well    Behavior During Therapy WFL for tasks assessed/performed              Past Medical History:  Diagnosis Date   Anxiety    Arthritis of both knees 03/22/2017   Asthma    as a child, no issues in adulthood   Depression    Fatty liver    GERD (gastroesophageal reflux disease)    Hypertension    Multiple thyroid nodules    Osteoporosis 03/22/2017   Pancytopenia (HCC) 03/22/2017   Past Surgical History:  Procedure Laterality Date   ANTERIOR CERVICAL DECOMP/DISCECTOMY FUSION N/A 02/21/2023   Procedure: Cervical Three-Cervical Four, Cervical Four - Cervical Five, Cervical Five-Cervical Six  Anterior Cervical Discectomy Fusion;  Surgeon: Barnett Abu, MD;  Location: [facility] OR;  Service: Neurosurgery;  Laterality: N/A;  RM 21 3C   KNEE SURGERY Left    Patient Active Problem List   Diagnosis Date Noted   Cervical spondylosis with myelopathy 02/21/2023   Pancytopenia (HCC) 03/22/2017   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   Arthritis of both knees 03/22/2017   Osteoporosis 03/22/2017    PCP: Sigmund Hazel, MD  REFERRING PROVIDER: Barnett Abu, MD  REFERRING DIAG: G95.9 (ICD-10-CM) - Disease of spinal cord, unspecified  THERAPY DIAG:  Muscle weakness (generalized)  Cramp and spasm  Abnormal posture  Rationale for Evaluation and Treatment: Rehabilitation  ONSET DATE: Anterior cervical decompression C3-4 C4-5 C5-6 on 02/21/2023  SUBJECTIVE:                                                                                                                                                                                                          SUBJECTIVE STATEMENT: Pt states that she is ready for discharge.  Hand dominance: Right  PERTINENT HISTORY:  Anterior cervical decompression C3-4 C4-5 C5-6 on 02/21/2023  PAIN:  Are you having pain? Yes: NPRS scale:  0-2/10 Pain location: cervical and right UE Pain description: sore Aggravating factors: movement Relieving  factors: rest  PRECAUTIONS: None  RED FLAGS: None     WEIGHT BEARING RESTRICTIONS: No  FALLS:  Has patient fallen in last 6 months? 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  NEXT MD VISIT: 2 months with Dr Danielle Dess  OBJECTIVE:   DIAGNOSTIC FINDINGS:  Cervical Radiograph on 02/21/2023: FINDINGS: Cross-table lateral images were obtained for surgical planning purposes. These demonstrate placement of a metallic marker overlying the N8-2 disc space. Second image demonstrates placement of an ACDF with intervertebral spacers at C3-6.  Cervical CT Scan on 03/24/2023: IMPRESSION: 1. Uncomplicated C3-C6 ACDF with bridging bone seen through each cage. 2. No interval adjacent segment degeneration when compared to preoperative MRI. 3. Heterogeneous enlargement the left lobe thyroid. Recommend thyroid ultrasound (ref: J Am Coll Radiol. 2015 Feb;12(2): 143-50).  PATIENT SURVEYS:  Eval:  Quick Dash 40.91 05/03/2023:  Quick DASH 27.27  06/30/2023:  Quick DASH  6.82  COGNITION: Overall cognitive status: Within functional limits for tasks assessed  SENSATION: Reports minimal  numbness and tingling in right hand, but has improved since before surgery  POSTURE: rounded shoulders  CERVICAL ROM:   Active ROM A/ROM (deg) eval  Flexion 47  Extension 45  Right lateral flexion 32  Left lateral flexion 40  Right rotation 62  Left rotation 65   (Blank rows = not tested)  UPPER EXTREMITY ROM:  Active ROM Right eval Right 04/07/23 Right 04/19/23 Right 05/03/23 Right 05/31/23 Right 06/23/23 Left eval  Shoulder flexion 35 57 60 78 86 145 160  Shoulder extension 65 65 65 68 74 82 80  Shoulder abduction 55 60 85 88 100 130 155  Shoulder adduction         Shoulder internal rotation         Shoulder external rotation          (Blank rows = not tested)  UPPER EXTREMITY MMT:  MMT Right eval Right 04/26/23 Right 05/31/23 Right 06/30/23 Left eval  Shoulder flexion 2 2+ 3- 4 5  Shoulder extension 3 3+ 3+ 4 5  Shoulder abduction 2 2+ 3- 4 5  Grip strength 40 lbs 47 lbs 52 lbs  47 lbs   (Blank rows = not tested)   TODAY'S TREATMENT:                                                                                                                               DATE: 06/30/2023 Nustep level 5 x6 min with PT present to discuss status Quick Dash, MMT Standing 4D scapular stabilization with yellow plyoball on wall x20 bilat Standing wall push-up 2x10 Standing  lat pulldown 25# 2x10 Standing triceps pressdown 10# 2x10 Prone with 2#:  shoulder flexion, shoulder extension, shoulder row, shoulder horizontal abduction.  Right UE 2x10 each Supine serratus punch 3# 2x10 Supine shoulder flexion with 3# 2x10 Left side-lying right shoulder abduction 2x10 Plank on elbows 5x5 sec Quadruped bird dog 2x5   DATE: 06/28/2023 Nustep level 5 x7 min with PT present to discuss status Standing 4D scapular stabilization with yellow plyoball on wall x20 bilat Standing lat pulldown 25# 2x10 Standing wall push-up 2x10 Standing triceps pressdown 10# 2x10 Prone with 2#:  shoulder  flexion, shoulder extension, shoulder row, shoulder horizontal abduction.  Right UE 2x10 each Supine serratus punch 3# 2x10 Supine shoulder flexion with 3# 2x10 Left side-lying right shoulder abduction 2x10 Supine shoulder ER and horizontal abduction with red tband 2x10 each   DATE: 06/23/2023 Nustep level 5 x6 min with PT present to discuss status Standing 4D scapular stabilization with yellow plyoball on wall x20 bilat Standing lat pulldown 25# 2x10 Standing wall push-up 2x10 Prone with 2#:  shoulder flexion, shoulder extension, shoulder row, shoulder horizontal abduction.  Right UE 2x10 each Left side-lying right shoulder abduction 2x10 A/ROM measurements Supine serratus punch 3# 2x10 Supine shoulder flexion with 3# 2x10 Supine shoulder ER and horizontal abduction with red tband 2x10 each    PATIENT EDUCATION:  Education details: Issued HEP Person educated: Patient Education method: Explanation, Demonstration, and Handouts Education comprehension: verbalized understanding and returned demonstration  HOME EXERCISE PROGRAM: Access Code: [email or web address] URL: [email or web address] Date: 06/30/2023 Prepared by: Clydie Braun Kyren Vaux  Exercises - Supine Chest Press with Dumbbells  - 1 x daily - 7 x weekly - 2 sets - 10 reps - Sidelying Shoulder External Rotation  - 1 x daily - 7 x weekly - 2 sets - 10 reps - Sidelying Shoulder Abduction Palm Forward  - 1 x daily - 7 x weekly - 2 sets - 10 reps - Standing Shoulder Row with Anchored Resistance  - 1 x daily - 7 x weekly - 2 sets - 10 reps - Shoulder extension with resistance - Neutral  - 1 x daily - 7 x weekly - 2 sets - 10 reps - Shoulder External Rotation and Scapular Retraction with Resistance  - 1 x daily - 7 x weekly - 2 sets - 10 reps - Standing Shoulder Horizontal Abduction with Resistance  - 1 x daily - 7 x weekly - 2 sets - 10 reps - Bird Dog  - 1 x daily - 7 x weekly - 2 sets - 5-10 reps - Standard Plank  - 1 x daily - 7  x weekly - 5 reps - 5 sec hold  ASSESSMENT:  CLINICAL IMPRESSION: Ms Gerich presents to skilled PT with no new complaints and that she is ready for discharge today.  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  Patient has improved with her right shoulder strength and has right shoulder A/ROM that is [facility] and able to perform typical household tasks.  Provided patient with updated HEP handout.  Patient is ready for discharge from skilled PT at this time to continue with  HEP.  OBJECTIVE IMPAIRMENTS: decreased ROM, decreased strength, increased muscle spasms, impaired flexibility, postural dysfunction, and pain.   ACTIVITY LIMITATIONS: carrying, lifting, reach over head, and hygiene/grooming  PARTICIPATION LIMITATIONS: meal prep, cleaning, laundry, community activity, and occupation  PERSONAL FACTORS: Fitness and 1 comorbidity: s/p cervical ACDF on 02/21/2023  are also affecting patient's functional outcome.   REHAB POTENTIAL: Good  CLINICAL DECISION MAKING: Stable/uncomplicated  EVALUATION COMPLEXITY: Low   GOALS: Goals reviewed with patient? Yes  SHORT TERM GOALS: Target date: 04/01/2023  Pt will be independent with initial HEP. Baseline:  Goal status: MET  2.  Pt will report at least 25% decrease in symptoms. Baseline:  Goal status: MET   LONG TERM GOALS: Target date: 07/01/2023  Pt will be independent with initial HEP. Baseline:  Goal status: Met  2.  Pt will increase Quick DASH to no greater than 25 to demonstrate her ability to complete functional tasks. Baseline: 40.91 Goal status: Met  3.  Pt will increase right shoulder strength to at least 4/5 to allow her to lift objects with her right hand and use for ADLs/IADLs. Baseline: 2/5 Goal status: Met  4.  Patient will increase her right shoulder A/ROM to at least 110 degrees flexion and abduction to allow her to reach into overhead  cabinets. Baseline: see above Goal status: Met on 06/23/2023  5.  Patient will increase right grip strength to greater than 50 pounds to allow her to grab and hold items in the home. Baseline: 40 lbs Goal status: Met 05/31/2023  6.  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 Baseline:  Goal status: Met on 06/30/23   PLAN:  PT FREQUENCY: 2x/week  PT DURATION: 8 weeks  PLANNED INTERVENTIONS: Therapeutic exercises, Therapeutic activity, Neuromuscular re-education, Balance training, Gait training, Patient/Family education, Self Care, Joint mobilization, Joint manipulation, Aquatic Therapy, Dry Needling, Spinal manipulation, Spinal mobilization, Cryotherapy, Moist heat, scar mobilization, Taping, Traction, Ultrasound, Ionotophoresis 4mg /ml Dexamethasone, Manual therapy, and Re-evaluation  PHYSICAL THERAPY DISCHARGE SUMMARY  Patient agrees to discharge. Patient goals were met. Patient is being discharged due to meeting the stated rehab goals.     Reather Laurence, PT, DPT 06/30/23, 11:43 AM  [facility] [address], [address] [place], [place] [address] Phone # [phone] Fax [phone]

## 2023-07-01 ENCOUNTER — Ambulatory Visit: Payer: Medicare Other

## 2023-08-02 ENCOUNTER — Ambulatory Visit
Admission: RE | Admit: 2023-08-02 | Discharge: 2023-08-02 | Disposition: A | Discharge status: Home / Self Care | Payer: Medicare Other | Source: Ambulatory Visit | Attending: Family Medicine | Admitting: Family Medicine

## 2023-08-02 DIAGNOSIS — Z1231 Encounter for screening mammogram for malignant neoplasm of breast: Secondary | ICD-10-CM | POA: Diagnosis not present

## 2023-08-04 DIAGNOSIS — K080 Exfoliation of teeth due to systemic causes: Secondary | ICD-10-CM | POA: Diagnosis not present

## 2023-08-18 DIAGNOSIS — Z23 Encounter for immunization: Secondary | ICD-10-CM | POA: Diagnosis not present

## 2023-08-18 DIAGNOSIS — Z0000 Encounter for general adult medical examination without abnormal findings: Secondary | ICD-10-CM | POA: Diagnosis not present

## 2023-08-18 DIAGNOSIS — Z6820 Body mass index (BMI) 20.0-20.9, adult: Secondary | ICD-10-CM | POA: Diagnosis not present

## 2023-08-19 ENCOUNTER — Other Ambulatory Visit: Payer: Self-pay | Admitting: Family Medicine

## 2023-08-19 DIAGNOSIS — M810 Age-related osteoporosis without current pathological fracture: Secondary | ICD-10-CM

## 2023-08-29 ENCOUNTER — Ambulatory Visit
Admission: RE | Admit: 2023-08-29 | Discharge: 2023-08-29 | Disposition: A | Discharge status: Home / Self Care | Payer: Medicare Other | Source: Ambulatory Visit | Attending: Family Medicine | Admitting: Family Medicine

## 2023-08-29 DIAGNOSIS — M810 Age-related osteoporosis without current pathological fracture: Secondary | ICD-10-CM

## 2023-08-29 DIAGNOSIS — N958 Other specified menopausal and perimenopausal disorders: Secondary | ICD-10-CM | POA: Diagnosis not present

## 2023-08-29 DIAGNOSIS — E2839 Other primary ovarian failure: Secondary | ICD-10-CM | POA: Diagnosis not present

## 2023-08-29 DIAGNOSIS — M8588 Other specified disorders of bone density and structure, other site: Secondary | ICD-10-CM | POA: Diagnosis not present

## 2023-09-14 DIAGNOSIS — K080 Exfoliation of teeth due to systemic causes: Secondary | ICD-10-CM | POA: Diagnosis not present

## 2023-11-02 DIAGNOSIS — K635 Polyp of colon: Secondary | ICD-10-CM | POA: Diagnosis not present

## 2023-11-02 DIAGNOSIS — D124 Benign neoplasm of descending colon: Secondary | ICD-10-CM | POA: Diagnosis not present

## 2023-11-02 DIAGNOSIS — Z09 Encounter for follow-up examination after completed treatment for conditions other than malignant neoplasm: Secondary | ICD-10-CM | POA: Diagnosis not present

## 2023-11-02 DIAGNOSIS — Z860101 Personal history of adenomatous and serrated colon polyps: Secondary | ICD-10-CM | POA: Diagnosis not present

## 2023-11-02 DIAGNOSIS — K5730 Diverticulosis of large intestine without perforation or abscess without bleeding: Secondary | ICD-10-CM | POA: Diagnosis not present

## 2023-11-02 DIAGNOSIS — K648 Other hemorrhoids: Secondary | ICD-10-CM | POA: Diagnosis not present

## 2023-11-02 DIAGNOSIS — K621 Rectal polyp: Secondary | ICD-10-CM | POA: Diagnosis not present

## 2023-11-24 DIAGNOSIS — M810 Age-related osteoporosis without current pathological fracture: Secondary | ICD-10-CM | POA: Diagnosis not present

## 2023-11-24 DIAGNOSIS — F331 Major depressive disorder, recurrent, moderate: Secondary | ICD-10-CM | POA: Diagnosis not present

## 2023-11-24 DIAGNOSIS — M2550 Pain in unspecified joint: Secondary | ICD-10-CM | POA: Diagnosis not present

## 2023-11-24 LAB — COMPREHENSIVE METABOLIC PANEL WITH GFR: EGFR: 67

## 2024-01-04 DIAGNOSIS — H43812 Vitreous degeneration, left eye: Secondary | ICD-10-CM | POA: Diagnosis not present

## 2024-01-04 DIAGNOSIS — T1511XA Foreign body in conjunctival sac, right eye, initial encounter: Secondary | ICD-10-CM | POA: Diagnosis not present

## 2024-01-04 DIAGNOSIS — H3562 Retinal hemorrhage, left eye: Secondary | ICD-10-CM | POA: Diagnosis not present

## 2024-01-10 DIAGNOSIS — K080 Exfoliation of teeth due to systemic causes: Secondary | ICD-10-CM | POA: Diagnosis not present

## 2024-01-12 DIAGNOSIS — R531 Weakness: Secondary | ICD-10-CM | POA: Diagnosis not present

## 2024-01-18 DIAGNOSIS — H11441 Conjunctival cysts, right eye: Secondary | ICD-10-CM | POA: Diagnosis not present

## 2024-01-20 DIAGNOSIS — R531 Weakness: Secondary | ICD-10-CM | POA: Diagnosis not present

## 2024-01-26 DIAGNOSIS — H35372 Puckering of macula, left eye: Secondary | ICD-10-CM | POA: Diagnosis not present

## 2024-01-26 DIAGNOSIS — H35413 Lattice degeneration of retina, bilateral: Secondary | ICD-10-CM | POA: Diagnosis not present

## 2024-01-26 DIAGNOSIS — H43813 Vitreous degeneration, bilateral: Secondary | ICD-10-CM | POA: Diagnosis not present

## 2024-01-26 DIAGNOSIS — H43393 Other vitreous opacities, bilateral: Secondary | ICD-10-CM | POA: Diagnosis not present

## 2024-01-30 DIAGNOSIS — R531 Weakness: Secondary | ICD-10-CM | POA: Diagnosis not present

## 2024-02-02 DIAGNOSIS — R531 Weakness: Secondary | ICD-10-CM | POA: Diagnosis not present

## 2024-02-03 DIAGNOSIS — M170 Bilateral primary osteoarthritis of knee: Secondary | ICD-10-CM | POA: Diagnosis not present

## 2024-02-06 DIAGNOSIS — R531 Weakness: Secondary | ICD-10-CM | POA: Diagnosis not present

## 2024-02-09 DIAGNOSIS — R531 Weakness: Secondary | ICD-10-CM | POA: Diagnosis not present

## 2024-02-16 DIAGNOSIS — R531 Weakness: Secondary | ICD-10-CM | POA: Diagnosis not present

## 2024-02-23 DIAGNOSIS — F325 Major depressive disorder, single episode, in full remission: Secondary | ICD-10-CM | POA: Diagnosis not present

## 2024-02-23 DIAGNOSIS — M2550 Pain in unspecified joint: Secondary | ICD-10-CM | POA: Diagnosis not present

## 2024-02-23 DIAGNOSIS — R531 Weakness: Secondary | ICD-10-CM | POA: Diagnosis not present

## 2024-03-01 DIAGNOSIS — R531 Weakness: Secondary | ICD-10-CM | POA: Diagnosis not present

## 2024-03-27 ENCOUNTER — Emergency Department (HOSPITAL_BASED_OUTPATIENT_CLINIC_OR_DEPARTMENT_OTHER)
Admission: EM | Admit: 2024-03-27 | Discharge: 2024-03-27 | Disposition: A | Discharge status: Home / Self Care | Attending: Emergency Medicine | Admitting: Emergency Medicine

## 2024-03-27 ENCOUNTER — Encounter (HOSPITAL_BASED_OUTPATIENT_CLINIC_OR_DEPARTMENT_OTHER): Payer: Self-pay

## 2024-03-27 ENCOUNTER — Emergency Department (HOSPITAL_BASED_OUTPATIENT_CLINIC_OR_DEPARTMENT_OTHER): Admitting: Radiology

## 2024-03-27 ENCOUNTER — Emergency Department (HOSPITAL_BASED_OUTPATIENT_CLINIC_OR_DEPARTMENT_OTHER)

## 2024-03-27 ENCOUNTER — Other Ambulatory Visit: Payer: Self-pay

## 2024-03-27 DIAGNOSIS — S299XXA Unspecified injury of thorax, initial encounter: Secondary | ICD-10-CM | POA: Diagnosis not present

## 2024-03-27 DIAGNOSIS — S0990XA Unspecified injury of head, initial encounter: Secondary | ICD-10-CM | POA: Insufficient documentation

## 2024-03-27 DIAGNOSIS — W010XXA Fall on same level from slipping, tripping and stumbling without subsequent striking against object, initial encounter: Secondary | ICD-10-CM | POA: Diagnosis not present

## 2024-03-27 DIAGNOSIS — M438X9 Other specified deforming dorsopathies, site unspecified: Secondary | ICD-10-CM | POA: Diagnosis not present

## 2024-03-27 DIAGNOSIS — I10 Essential (primary) hypertension: Secondary | ICD-10-CM | POA: Diagnosis not present

## 2024-03-27 DIAGNOSIS — R0781 Pleurodynia: Secondary | ICD-10-CM | POA: Diagnosis not present

## 2024-03-27 DIAGNOSIS — Z79899 Other long term (current) drug therapy: Secondary | ICD-10-CM | POA: Diagnosis not present

## 2024-03-27 DIAGNOSIS — J45909 Unspecified asthma, uncomplicated: Secondary | ICD-10-CM | POA: Diagnosis not present

## 2024-03-27 DIAGNOSIS — S2241XA Multiple fractures of ribs, right side, initial encounter for closed fracture: Secondary | ICD-10-CM | POA: Diagnosis not present

## 2024-03-27 DIAGNOSIS — Z4789 Encounter for other orthopedic aftercare: Secondary | ICD-10-CM | POA: Diagnosis not present

## 2024-03-27 DIAGNOSIS — Q7649 Other congenital malformations of spine, not associated with scoliosis: Secondary | ICD-10-CM

## 2024-03-27 MED ORDER — METHOCARBAMOL 500 MG PO TABS: 500.0000 mg | ORAL_TABLET | Freq: Four times a day (QID) | ORAL | 1 refills | Status: DC | PRN

## 2024-03-27 MED ORDER — LIDOCAINE 5 % EX PTCH: 1.0000 | MEDICATED_PATCH | CUTANEOUS | 0 refills | Status: AC

## 2024-03-27 NOTE — ED Notes (Signed)
 RT giving Facilities manager education.

## 2024-03-27 NOTE — ED Provider Notes (Signed)
 [place] EMERGENCY DEPARTMENT AT [facility] Provider Note   CSN: [id number] Arrival date & time: 03/27/24  1405     Patient presents with: [place]   Sandra Day is a 78 y.o. female with past medical history significant for osteoporosis, hypertension, asthma, gerd who does not take a blood thinner who presents after mechanical, non syncopal fall while walking her dog, reports that she tripped over her feet.     Fall       Prior to Admission medications   Medication Sig Start Date End Date Taking? Authorizing Provider  lidocaine  (LIDODERM ) 5 % Place 1 patch onto the skin daily. Remove & Discard patch within 12 hours or as directed by MD 03/27/24  Yes Dontrell Stuck H, PA-C  cetirizine (ZYRTEC) 10 MG tablet Take 10 mg by mouth daily.    [provider]  Cholecalciferol (VITAMIN D3) 5000 units TABS Take 5,000 Units by mouth daily.    [provider]  cycloSPORINE  (RESTASIS ) 0.05 % ophthalmic emulsion Place 1 drop into both eyes 2 (two) times daily. 05/06/21   [provider]  Famotidine  (ACID REDUCER PO) Take 1 tablet by mouth daily.    [provider]  losartan  (COZAAR ) 50 MG tablet Take 50 mg by mouth daily. 11/29/19   [provider]  methocarbamol  (ROBAXIN ) 500 MG tablet Take 1 tablet (500 mg total) by mouth every 6 (six) hours as needed for muscle spasms. 03/27/24   Kadien Lineman H, PA-C  Multiple Vitamins-Minerals (PRESERVISION AREDS 2 PO) Take 1 capsule by mouth in the morning and at bedtime.    [provider]  Nutritional Supplements (JUICE PLUS FIBRE PO) Take 4 capsules by mouth daily. Takes 2 veggies and 2 fruits a day    [provider]  rosuvastatin  (CRESTOR ) 5 MG tablet Take 5 mg by mouth every other day. 09/30/21   [provider]  sertraline  (ZOLOFT ) 50 MG tablet Take 50 mg by mouth daily. 01/31/17   [provider]  vitamin B-12 (CYANOCOBALAMIN ) 1000 MCG tablet Take 1,000 mcg  by mouth daily.    [provider]  zinc gluconate 50 MG tablet Take 50 mg by mouth daily.    [provider]    Allergies: Sulfa antibiotics and Penicillins    Review of Systems  All other systems reviewed and are negative.   Updated Vital Signs BP 139/72   Pulse 72   Temp 98.4 F (36.9 C)   Resp 18   SpO2 97%   Physical Exam Vitals and nursing note reviewed.  Constitutional:      General: She is not in acute distress.    Appearance: Normal appearance.  HENT:     Head: Normocephalic and atraumatic.  Eyes:     General:        Right eye: No discharge.        Left eye: No discharge.  Cardiovascular:     Rate and Rhythm: Normal rate and regular rhythm.     Heart sounds: No murmur heard.    No friction rub. No gallop.  Pulmonary:     Effort: Pulmonary effort is normal.     Breath sounds: Normal breath sounds.  Abdominal:     General: Bowel sounds are normal.     Palpations: Abdomen is soft.  Musculoskeletal:     Comments: Focal ttp of right ribcage, no step off, deformity  Skin:    General: Skin is warm and dry.     Capillary  Refill: Capillary refill takes less than 2 seconds.     Comments: Small bruise with mild soft tissue swelling on right cheek, no stepoff, deformity  Neurological:     Mental Status: She is alert and oriented to person, place, and time.  Psychiatric:        Mood and Affect: Mood normal.        Behavior: Behavior normal.     (all labs ordered are listed, but only abnormal results are displayed) Labs Reviewed - No data to display  EKG: None  Radiology: CT Cervical Spine Wo Contrast Result Date: 03/27/2024 CLINICAL DATA:  Neck trauma (Age >= 65y) EXAM: CT CERVICAL SPINE WITHOUT CONTRAST TECHNIQUE: Multidetector CT imaging of the cervical spine was performed without intravenous contrast. Multiplanar CT image reconstructions were also generated. RADIATION DOSE REDUCTION: This exam was performed according to the departmental  dose-optimization program which includes automated exposure control, adjustment of the mA and/or kV according to patient size and/or use of iterative reconstruction technique. COMPARISON:  March 24, 2023 FINDINGS: Alignment: Straightening of the normal cervical lordosis with anterior fusion hardware extending from C3 through C6 with intervertebral disc cages. No spondylolisthesis, uncovering of the facet joints, or significant widening of the spinous processes. No subluxation or abnormality identified at the craniovertebral junction. Skull base and vertebrae: Vertebral body heights are preserved. No acute fracture. No primary bone lesion or focal pathologic process.The lateral masses of C1 are well aligned with C2. The odontoid is intact. Soft tissues and spinal canal: No prevertebral edema or soft tissue thickening. No visible canal hematoma. Disc levels: Similar intervertebral disc height loss at C2-C3 and C6-C7. mild multilevel facet arthropathy, most notably on the left. Upper chest: Biapical pleuroparenchymal scarring. No focal airspace consolidation or pleural effusion. Similar heterogeneous enlargement of the left thyroid  lobe. Other: None IMPRESSION: 1. No acute fracture or traumatic malalignment of the cervical spine. 2. Similar appearance of the C3 through C6 anterior fusion hardware. No evidence of hardware complication. Electronically Signed   By: Rogelia Myers M.D.   On: 03/27/2024 15:45   CT Head Wo Contrast Result Date: 03/27/2024 CLINICAL DATA:  Head trauma, minor (Age >= 65y) EXAM: CT HEAD WITHOUT CONTRAST TECHNIQUE: Contiguous axial images were obtained from the base of the skull through the vertex without intravenous contrast. RADIATION DOSE REDUCTION: This exam was performed according to the departmental dose-optimization program which includes automated exposure control, adjustment of the mA and/or kV according to patient size and/or use of iterative reconstruction technique. COMPARISON:   None Available. FINDINGS: Brain: The ventricles appear age appropriate. No mass effect or midline shift. Gray-white differentiation is preserved without focal attenuation abnormality.No evidence of acute territorial infarction, extra-axial fluid collection, hemorrhage, or mass lesion. The basilar cisterns are patent without downward herniation. The cerebellar hemispheres and vermis are well formed without mass lesion or focal attenuation abnormality. Vascular: No hyperdense vessel. Skull: Normal. Negative for fracture or focal lesion. Sinuses/Orbits: Mucosal thickening and opacification of the left maxillary sinus with peripheral calcification. There is thickening of the bone surrounding the maxillary sinus. The remaining paranasal sinuses and mastoids are clear.The globes appear intact. No retrobulbar hematoma.Left lens replacement. Other: None. IMPRESSION: 1. No acute intracranial abnormality, specifically, no acute hemorrhage, territorial infarction, or intracranial mass. 2. Mucosal thickening with masslike opacification of the left maxillary sinus, possibly due to chronic fungal sinusitis or representing inspissated secretions. Electronically Signed   By: Rogelia Myers M.D.   On: 03/27/2024 15:31   DG Ribs Unilateral W/Chest Right  Result Date: 03/27/2024 CLINICAL DATA:  Mechanical fall with right rib pain EXAM: RIGHT RIBS AND CHEST - 3 VIEW COMPARISON:  Chest radiograph dated 03/11/2017 FINDINGS: Minimal cortical discontinuity involving the right anterolateral seventh and ninth ribs. Cervical spinal fixation hardware appears intact. Age indeterminate superior endplate compression deformity of L2. Suspected superior endplate compression of T11. Unchanged compression deformity of T12. There is no evidence of pneumothorax or pleural effusion. Both lungs are clear. Heart size and mediastinal contours are within normal limits. IMPRESSION: 1. Minimal cortical discontinuity involving the right anterolateral  seventh and ninth ribs, which may represent minimally displaced fractures. 2. Age indeterminate superior endplate compression deformity of L2. Suspected superior endplate compression of T11. Recommend correlation with point tenderness. Electronically Signed   By: Limin  Xu M.D.   On: 03/27/2024 15:19     Procedures   Medications Ordered in the ED - No data to display                                  Medical Decision Making Amount and/or Complexity of Data Reviewed Radiology: ordered.  Risk Prescription drug management.   This patient is a 78 y.o. female who presents to the ED for concern of mechanical fall, head, rib injury.   Differential diagnoses prior to evaluation: epidural hematoma, subdural hematoma, skull fracture, subarachnoid hemorrhage, unstable cervical spine fracture, concussion vs other MSK injury, rib fracture, pneumothorax, versus other x-ray, dislocation  Past Medical History / Social History / Additional history: Chart reviewed. Pertinent results include: Osteoporosis, cervical spondylosis, GERD, asthma  Physical Exam: Physical exam performed. The pertinent findings include: Focal tenderness of the right rib cage without obvious step-off, deformity.  Small bruise with mild soft tissue swelling of the right cheek.  I independently interpreted imaging including ct cervical spine, ct head wo contrast, right rib xray which shows   1. No acute fracture or traumatic malalignment of the cervical  spine.  2. Similar appearance of the C3 through C6 anterior fusion hardware.  No evidence of hardware complication.   1. Minimal cortical discontinuity involving the right anterolateral  seventh and ninth ribs, which may represent minimally displaced  fractures.  2. Age indeterminate superior endplate compression deformity of L2.  Suspected superior endplate compression of T11. Recommend  correlation with point tenderness.  . I agree with the radiologist  interpretation.  Medications / Treatment: Encourage ibuprofen, Tylenol , muscle relaxant,   Disposition: After consideration of the diagnostic results and the patients response to treatment, I feel that patient stable for discharge, I palpated over the area of T11 for which concern was raised about possible deformity, on my exam patient did not have any significant tenderness, given this exam I think reasonable to not provide any additional stabilization, she can follow-up with her neurosurgeon if she begins to have worsening pain in the lumbar/thoracic region, otherwise we will plan to treat rib fractures with pain medication as above..   emergency department workup does not suggest an emergent condition requiring admission or immediate intervention beyond what has been performed at this time. The plan is: as above. The patient is safe for discharge and has been instructed to return immediately for worsening symptoms, change in symptoms or any other concerns.   Final diagnoses:  Closed fracture of multiple ribs of right side, initial encounter  Defect of endplate of vertebra    ED Discharge Orders  Ordered    methocarbamol  (ROBAXIN ) 500 MG tablet  Every 6 hours PRN        03/27/24 1613    lidocaine  (LIDODERM ) 5 %  Every 24 hours        03/27/24 1613               Rosan Sherlean DEL, PA-C 03/27/24 1637    Cottie Donnice PARAS, MD 03/27/24 [phone]

## 2024-03-27 NOTE — ED Triage Notes (Signed)
 Pt c/o mechanical fall approx 1hr ago. Advises she went to UC, sent to ED for CT. Pt states she is not as concerned about my head, but ribs on my R side hurt- I don't think I cracked them, but they hurt.

## 2024-03-27 NOTE — Discharge Instructions (Addendum)
 Please use Tylenol  or ibuprofen for pain.  You may use 600 mg ibuprofen every 6 hours or 1000 mg of Tylenol  every 6 hours.  You may choose to alternate between the 2.  This would be most effective.  Not to exceed 4 g of Tylenol  within 24 hours.  Not to exceed 3200 mg ibuprofen 24 hours.  You can use the muscle relaxant I am prescribing in addition to the above to help with any breakthrough pain.  You can take it up to twice daily.  It is safe to take at night, but I would be cautious taking it during the day as it can cause some drowsiness.  Make sure that you are feeling awake and alert before you get behind the wheel of a car or operate a motor vehicle.  It is not a narcotic pain medication so you are able to take it if it is not making you drowsy and still pilot a vehicle or machinery safely.  They noted a possible endplate compression deformity on your x-ray at the level of T11, however I have overall low suspicion that this is acute because of the lack of tenderness over this spinal level.  Nonetheless it may be helpful to follow-up with Dr. Colon if you continue to have low to mid back pain.  You may want to return to the emergency department if you have severe worsening back pain, numbness, tingling, loss of control of your bladder, bowels.  I would use the incentive spirometer that we have provided once an hour while awake at least for the next 2 weeks to ensure that you are recruiting all of your lung tissue and preventing things like a developing pneumonia.  You can place the lidocaine  patches directly where it hurts.

## 2024-03-29 DIAGNOSIS — M170 Bilateral primary osteoarthritis of knee: Secondary | ICD-10-CM | POA: Diagnosis not present

## 2024-04-04 DIAGNOSIS — K080 Exfoliation of teeth due to systemic causes: Secondary | ICD-10-CM | POA: Diagnosis not present

## 2024-04-05 DIAGNOSIS — M170 Bilateral primary osteoarthritis of knee: Secondary | ICD-10-CM | POA: Diagnosis not present

## 2024-04-12 DIAGNOSIS — M170 Bilateral primary osteoarthritis of knee: Secondary | ICD-10-CM | POA: Diagnosis not present

## 2024-04-12 DIAGNOSIS — H5213 Myopia, bilateral: Secondary | ICD-10-CM | POA: Diagnosis not present

## 2024-05-10 NOTE — Progress Notes (Signed)
 Office Visit Note  Patient: Sandra Day             Date of Birth: 06-11-1946           MRN: [id number]             PCP: Cleotilde Planas, MD Referring: Cleotilde Planas, MD Visit Date: 05/24/2024 Occupation: Data Unavailable  Subjective:  Pain in multiple joints  History of Present Illness: Sandra Day is a 78 y.o. female seen for the evaluation of polyarthralgia and positive ANA.  According the patient her symptoms started in her 43s with knee joint pain and discomfort.  She states in her 55s she started having neck and lower back pain.  She started going to a chiropractor and later started seeing Dr. Colon.  She underwent cervical spine fusion in June 2024.  She states the fusion was helpful but she continues to have some residual right hand paresthesias.  She states at age 83 she fell and fractured her left patella.  She underwent surgery by [name].  At the time he also did her DEXA scan which showed osteoporosis.  She has had chronic discomfort in her left knee joint since then.  She has been under care of [name] now.  She has been having recurrent swelling in her left knee joint for the last 4 years off-and-on.  She has been told she has a meniscal tear.  Although surgery was not advised.  She was told that she will eventually require total knee replacement.  She has been getting viscosupplement injections at their office.  Her last viscosupplement injections were on April 12, 2024.  She complains of discomfort in multiple joints including her lower back, elbows, wrist, hands, knees, ankles or feet.  She notices swelling in her hands and her knee joints.  She states for the last 3 years she feels generalized pain.  She does not have that much muscle pain.  In March she had labs done by her PCP which showed positive ANA for that reason she was referred to me.  She states she has taken Fosamax off and on since she was 74.  She has not taken Fosamax for the last 3 years.  She gets DEXA  scan every 2 years.  She gives history of T12 vertebral fracture which was found incidentally.  She gives history of fatigue, dry eyes.  There is no history of Raynaud's, photosensitivity, lymphadenopathy. She had an arche at age 88 and menopause at age 34.  She denies any exposure to thyroid  medications, Pepcid  or antiepileptics.  There is no history of radiation therapy or bone cancer.  Her mother had femur fracture.  She goes for regular cleaning to the dentist.  She had 2 recent crowns and does not need any more dental work. There is family history of osteoarthritis in her mother.  She is right-handed, retired.  She used to work for [employer] as a Nurse, children's.  She did a lot of desk work and traveling.  She enjoys dog rescue.  She is not exercising on a regular basis currently.  She is single, gravida 0, no history of DVTs.  She drinks alcohol occasionally.  She smoked 1 pack/day for 30 years.  She quit smoking in 1983.    Activities of Daily Living:  Patient reports morning stiffness for a few minutes.   Patient Reports nocturnal pain.  Difficulty dressing/grooming: Denies Difficulty climbing stairs: Denies Difficulty getting out of chair: Denies Difficulty using  hands for taps, buttons, cutlery, and/or writing: Reports  Review of Systems  Constitutional:  Positive for fatigue.  HENT:  Negative for mouth sores and mouth dryness.   Eyes:  Positive for dryness.  Respiratory:  Positive for shortness of breath.   Cardiovascular:  Negative for chest pain and palpitations.  Gastrointestinal:  Negative for blood in stool, constipation and diarrhea.  Endocrine: Positive for increased urination.  Genitourinary:  Negative for involuntary urination.  Musculoskeletal:  Positive for joint pain, joint pain, joint swelling, myalgias, morning stiffness and myalgias. Negative for gait problem, muscle weakness and muscle tenderness.  Skin:  Positive for hair loss. Negative for color change,  rash and sensitivity to sunlight.  Allergic/Immunologic: Negative for susceptible to infections.  Neurological:  Positive for numbness. Negative for dizziness and headaches.  Hematological:  Negative for swollen glands.  Psychiatric/Behavioral:  Positive for depressed mood. Negative for sleep disturbance. The patient is nervous/anxious.     PMFS History:  Patient Active Problem List   Diagnosis Date Noted   Cervical spondylosis with myelopathy 02/21/2023   Pancytopenia (HCC) 03/22/2017   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   Arthritis of both knees 03/22/2017   Osteoporosis 03/22/2017    Past Medical History:  Diagnosis Date   Anxiety    Arthritis of both knees 03/22/2017   Asthma    as a child, no issues in adulthood   Depression    Fatty liver    GERD (gastroesophageal reflux disease)    Hypertension    Multiple thyroid  nodules    Osteoporosis 03/22/2017   Pancytopenia (HCC) 03/22/2017    Family History  Problem Relation Age of Onset   Cervical cancer Mother    Emphysema Father    Breast cancer Sister        in hers 66s   COPD Sister    Past Surgical History:  Procedure Laterality Date   ANTERIOR CERVICAL DECOMP/DISCECTOMY FUSION N/A 02/21/2023   Procedure: Cervical Three-Cervical Four, Cervical Four - Cervical Five, Cervical Five-Cervical Six  Anterior Cervical Discectomy Fusion;  Surgeon: Colon Shove, MD;  Location: [facility] OR;  Service: Neurosurgery;  Laterality: N/A;  RM 21 3C   KNEE SURGERY Left    Social History   Tobacco Use   Smoking status: Former    Current packs/day: 0.00    Types: Cigarettes    Quit date: 08/30/1978    Years since quitting: 45.7    Passive exposure: Past   Smokeless tobacco: Never   Tobacco comments:    Quit x 30 years.  Vaping Use   Vaping status: Never Used  Substance Use Topics   Alcohol use: Yes    Comment: maybe one drink once a month, socially   Drug use: No   Social History   Social History Narrative   Not on  file     Immunization History  Administered Date(s) Administered   PFIZER(Purple Top)SARS-COV-2 Vaccination 09/27/2019, 10/23/2019     Objective: Vital Signs: BP 133/80 (BP Location: Right Arm, Patient Position: Sitting, Cuff Size: Normal)   Pulse 69   Temp 97.6 F (36.4 C)   Resp 14   Ht 5' 6 (1.676 m)   Wt 127 lb 9.6 oz (57.9 kg)   BMI 20.60 kg/m    Physical Exam Vitals and nursing note reviewed.  Constitutional:      Appearance: She is well-developed.  HENT:     Head: Normocephalic and atraumatic.  Eyes:     Conjunctiva/sclera: Conjunctivae normal.  Cardiovascular:  Rate and Rhythm: Normal rate and regular rhythm.     Heart sounds: Normal heart sounds.  Pulmonary:     Effort: Pulmonary effort is normal.     Breath sounds: Normal breath sounds.  Abdominal:     General: Bowel sounds are normal.     Palpations: Abdomen is soft.  Musculoskeletal:     Cervical back: Normal range of motion.  Lymphadenopathy:     Cervical: No cervical adenopathy.  Skin:    General: Skin is warm and dry.     Capillary Refill: Capillary refill takes less than 2 seconds.  Neurological:     Mental Status: She is alert and oriented to person, place, and time.  Psychiatric:        Behavior: Behavior normal.      Musculoskeletal Exam: She had limited range of motion of the cervical spine and had a surgical scar from cervical fusion.  She had lumbar scoliosis.  There was no tenderness over thoracic or lumbar spine.  There was no SI joint tenderness.  Shoulder joints, elbow joints, wrist joints, MCPs, PIPs and DIPs were in good range of motion with no synovitis.  Bilateral CMC and DIP thickening suggestive of osteoarthritis was noted.  Hip joints and knee joints were in good range of motion .  She had full painful range of motion of her left knee joint which was swollen and warm.  She also had a surgical scar on her left knee.  There was no tenderness over ankles or MTPs.  Bilateral PIP and  DIP thickening and dorsal spurring was noted.  No synovitis was noted.  She had hypermobility in most of her joints.  CDAI Exam: CDAI Score: -- Patient Global: --; Provider Global: -- Swollen: --; Tender: -- Joint Exam 05/24/2024   No joint exam has been documented for this visit   There is currently no information documented on the homunculus. Go to the Rheumatology activity and complete the homunculus joint exam.  Investigation: No additional findings.  Imaging: No results found.  Recent Labs: Lab Results  Component Value Date   WBC 5.9 02/11/2023   HGB 12.9 02/11/2023   PLT 239 02/11/2023   NA 139 02/11/2023   K 4.5 02/11/2023   CL 106 02/11/2023   CO2 25 02/11/2023   GLUCOSE 90 02/11/2023   BUN 17 02/11/2023   CREATININE 0.92 02/11/2023   BILITOT 1.0 02/11/2023   ALKPHOS 48 02/11/2023   AST 17 02/11/2023   ALT 13 02/11/2023   PROT 6.3 (L) 02/11/2023   ALBUMIN  4.1 02/11/2023   CALCIUM  9.4 02/11/2023   GFRAA 80 03/22/2017   November 24, 2023 CRP<1.0, RPR nonreactive, vitamin D35.9, sed rate 1, CBC WBC 5.3, hemoglobin 13.4, platelets 247 CMP creatinine 0.89, GFR 67, AST 15, ALT 8 normal, ANA 1: 160 NS, RF<10, vitamin D  Speciality Comments: No specialty comments available.  Procedures:  No procedures performed Allergies: Sulfa antibiotics and Penicillins   Assessment / Plan:     Visit Diagnoses: Positive ANA (antinuclear antibody) -patient has low titer positive ANA.  She denies any history of oral ulcers, nasal ulcers, malar rash, photosensitivity, lymphadenopathy.  She gives history of arthralgias, fatigue, sicca symptoms.  I will obtain additional labs today.  No synovitis was noted on the examination.  Plan: RNP Antibody, Sjogrens syndrome-A extractable nuclear antibody, Sjogrens syndrome-B extractable nuclear antibody, Anti-DNA antibody, double-stranded, Anti-Smith antibody, C3 and C4, Beta-2 glycoprotein antibodies, Cardiolipin antibodies, IgG, IgM, IgA, Protein /  creatinine ratio, urine  Polyarthralgia-she complains of pain and discomfort in multiple joints at least for the last 3 years.  She complains of discomfort in her lower back, elbows, wrist, hands, knees, ankles, feet.  Other fatigue -she gives history of fatigue.  Plan: TSH, CK, Serum protein electrophoresis with reflex  Pain in both hands -she complains of discomfort in the bilateral hands.  Bilateral CMC and DIP thickening was noted suggestive of osteoarthritis.  No synovitis was noted.  Plan: XR Hand 2 View Right, XR Hand 2 View Left, x-ray showed generalized osteopenia and osteoarthritic changes.  Cyclic citrul peptide antibody, IgG  Chronic pain of both knees-she complains of pain and discomfort in her knee joints especially her left knee joints since the fall at age 46.  She had left patellar fracture which required surgery.  She has had recurrent injections at [facility].  She also has been getting viscosupplement injections.  She was told that she had meniscal tear in her left knee joint and will eventually require left total knee replacement.  Her last viscosupplement injections were in August 2025.  Pain in both feet -she complains of discomfort in her feet.  Bilateral dorsal spurs, PIP and DIP thickening suggestive of osteoarthritis was noted.  No synovitis was noted.  Plan: XR Foot 2 Views Right, XR Foot 2 Views Left.  X-ray showed generalized osteopenia and osteoarthritic changes.  DDD (degenerative disc disease), cervical - Followed by Dr. Colon, C3 -C6 ACDF June 2024.  She had limited range of motion of the cervical spine.  Lumbar spondylosis -chronic discomfort.  She is followed by Dr. Colon.  Other idiopathic scoliosis, lumbar region-levoscoliosis of the lumbar spine was noted.  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  Joint protection was discussed.  Isometric exercises were discussed.  Increased risk of osteoarthritis with hypermobility was  discussed.  Age-related osteoporosis without current pathological fracture - Diagnosed at age 28 after knee fracture.  Treated with Fosamax intermittently. -August 29, 2023 DEXA scan:The BMD measured at [facility] L1-L4 is 0.850 g/cm2 with a T-score of -2.8. Plan: Parathyroid  hormone, intact (no Ca), Phosphorus.  Based on her DEXA scan, 2 thoracic vertebral fractures, history of significant smoking in the past and family history of femur fracture in her mother she is in the category of severe osteoporosis.  I had a detailed discussion with the patient.  She states that she never had radiation therapy.  There is no history of bone cancer.  She does not have heart disease.  She may benefit from [facility] for 1 year followed by [facility].  She states she goes to the dentist on a regular basis.  She does not have any impending dental work.  History of vertebral fracture - T11 and T12 vertebral fractures were noted March 27, 2024 on thoracic films. IMPRESSION: 1. Minimal cortical discontinuity involving the right anterolateral seventh and ninth ribs, which may represent minimally displaced fractures. 2. Age indeterminate superior endplate compression deformity of L2. Suspected superior endplate compression of T11. Recommend correlation with point tenderness.  Electronically Signed   By: Limin  Xu M.D.   On: 03/27/2024 15:19  Dyslipidemia-she is currently not taking statins.  Multinodular goiter (nontoxic)  Anxiety and depression  Former smoker - Quit smoking 1983.  Smoked 1 pack/day for 30 years.  History of recent fall-patient states that she fell in July since then she has been having discomfort in the right rib cage.  She was also found to have compression fractures in her vertebrae.  I  will refer her to physical therapy for lower extremity muscle strengthening and fall prevention.  Orders: Orders Placed This Encounter  Procedures   XR Hand 2 View Right   XR Hand 2 View Left   XR Foot 2 Views  Right   XR Foot 2 Views Left   TSH   CK   RNP Antibody   Sjogrens syndrome-A extractable nuclear antibody   Sjogrens syndrome-B extractable nuclear antibody   Anti-DNA antibody, double-stranded   Anti-Smith antibody   C3 and C4   Beta-2 glycoprotein antibodies   Cardiolipin antibodies, IgG, IgM, IgA   Cyclic citrul peptide antibody, IgG   Parathyroid  hormone, intact (no Ca)   Phosphorus   Serum protein electrophoresis with reflex   Protein / creatinine ratio, urine   Ambulatory referral to Physical Therapy   No orders of the defined types were placed in this encounter.    Follow-Up Instructions: Return for Osteoarthritis, Osteoporosis.   Maya Nash, MD  Note - This record has been created using Animal nutritionist.  Chart creation errors have been sought, but may not always  have been located. Such creation errors do not reflect on  the standard of medical care.

## 2024-05-24 ENCOUNTER — Ambulatory Visit

## 2024-05-24 ENCOUNTER — Ambulatory Visit (INDEPENDENT_AMBULATORY_CARE_PROVIDER_SITE_OTHER)

## 2024-05-24 ENCOUNTER — Encounter: Payer: Self-pay | Admitting: Rheumatology

## 2024-05-24 ENCOUNTER — Ambulatory Visit: Attending: Rheumatology | Admitting: Rheumatology

## 2024-05-24 VITALS — BP 133/80 | HR 69 | Temp 97.6°F | Resp 14 | Ht 66.0 in | Wt 127.6 lb

## 2024-05-24 DIAGNOSIS — M810 Age-related osteoporosis without current pathological fracture: Secondary | ICD-10-CM

## 2024-05-24 DIAGNOSIS — M79672 Pain in left foot: Secondary | ICD-10-CM

## 2024-05-24 DIAGNOSIS — E042 Nontoxic multinodular goiter: Secondary | ICD-10-CM

## 2024-05-24 DIAGNOSIS — E785 Hyperlipidemia, unspecified: Secondary | ICD-10-CM

## 2024-05-24 DIAGNOSIS — M79642 Pain in left hand: Secondary | ICD-10-CM

## 2024-05-24 DIAGNOSIS — M25562 Pain in left knee: Secondary | ICD-10-CM

## 2024-05-24 DIAGNOSIS — M79641 Pain in right hand: Secondary | ICD-10-CM

## 2024-05-24 DIAGNOSIS — M79671 Pain in right foot: Secondary | ICD-10-CM | POA: Diagnosis not present

## 2024-05-24 DIAGNOSIS — M4126 Other idiopathic scoliosis, lumbar region: Secondary | ICD-10-CM

## 2024-05-24 DIAGNOSIS — M25561 Pain in right knee: Secondary | ICD-10-CM

## 2024-05-24 DIAGNOSIS — R768 Other specified abnormal immunological findings in serum: Secondary | ICD-10-CM | POA: Diagnosis not present

## 2024-05-24 DIAGNOSIS — Z87891 Personal history of nicotine dependence: Secondary | ICD-10-CM

## 2024-05-24 DIAGNOSIS — F419 Anxiety disorder, unspecified: Secondary | ICD-10-CM

## 2024-05-24 DIAGNOSIS — Z8781 Personal history of (healed) traumatic fracture: Secondary | ICD-10-CM

## 2024-05-24 DIAGNOSIS — R5383 Other fatigue: Secondary | ICD-10-CM | POA: Diagnosis not present

## 2024-05-24 DIAGNOSIS — G8929 Other chronic pain: Secondary | ICD-10-CM

## 2024-05-24 DIAGNOSIS — F32A Depression, unspecified: Secondary | ICD-10-CM

## 2024-05-24 DIAGNOSIS — Z9181 History of falling: Secondary | ICD-10-CM

## 2024-05-24 DIAGNOSIS — M249 Joint derangement, unspecified: Secondary | ICD-10-CM

## 2024-05-24 DIAGNOSIS — M2550 Pain in unspecified joint: Secondary | ICD-10-CM

## 2024-05-24 DIAGNOSIS — M5030 Other cervical disc degeneration, unspecified cervical region: Secondary | ICD-10-CM

## 2024-05-24 DIAGNOSIS — M47816 Spondylosis without myelopathy or radiculopathy, lumbar region: Secondary | ICD-10-CM

## 2024-05-24 NOTE — Patient Instructions (Signed)

## 2024-05-29 ENCOUNTER — Ambulatory Visit: Payer: Self-pay | Admitting: Rheumatology

## 2024-05-29 LAB — PROTEIN ELECTROPHORESIS, SERUM, WITH REFLEX
Albumin ELP: 4.6 g/dL (ref 3.8–4.8)
Alpha 1: 0.4 g/dL — ABNORMAL HIGH (ref 0.2–0.3)
Alpha 2: 0.7 g/dL (ref 0.5–0.9)
Beta 2: 0.4 g/dL (ref 0.2–0.5)
Beta Globulin: 0.5 g/dL (ref 0.4–0.6)
Gamma Globulin: 0.6 g/dL — ABNORMAL LOW (ref 0.8–1.7)
Total Protein: 7.3 g/dL (ref 6.1–8.1)

## 2024-05-29 LAB — IFE INTERPRETATION

## 2024-05-29 LAB — C3 AND C4
C3 Complement: 178 mg/dL (ref 83–193)
C4 Complement: 39 mg/dL (ref 15–57)

## 2024-05-29 LAB — TSH: TSH: 1.02 m[IU]/L (ref 0.40–4.50)

## 2024-05-29 LAB — BETA-2 GLYCOPROTEIN ANTIBODIES
Beta-2 Glyco 1 IgA: <2 U/mL (ref <20.0)
Beta-2 Glyco 1 IgM: 6.1 U/mL (ref <20.0)
Beta-2 Glyco I IgG: <2 U/mL (ref <20.0)

## 2024-05-29 LAB — SJOGRENS SYNDROME-B EXTRACTABLE NUCLEAR ANTIBODY: SSB (La) (ENA) Antibody, IgG: <1 AI

## 2024-05-29 LAB — PROTEIN / CREATININE RATIO, URINE
Creatinine, Urine: 59 mg/dL (ref 20–275)
Protein/Creat Ratio: 102 mg/g{creat} (ref 24–184)
Protein/Creatinine Ratio: 0.102 mg/mg{creat} (ref 0.024–0.184)
Total Protein, Urine: 6 mg/dL (ref 5–24)

## 2024-05-29 LAB — ANTI-DNA ANTIBODY, DOUBLE-STRANDED: ds DNA Ab: <1 [IU]/mL

## 2024-05-29 LAB — CYCLIC CITRUL PEPTIDE ANTIBODY, IGG: Cyclic Citrullin Peptide Ab: <16 U

## 2024-05-29 LAB — ANTI-SMITH ANTIBODY: ENA SM Ab Ser-aCnc: <1 AI

## 2024-05-29 LAB — CK: Total CK: 13 U/L — ABNORMAL LOW (ref 18–225)

## 2024-05-29 LAB — CARDIOLIPIN ANTIBODIES, IGG, IGM, IGA
Anticardiolipin IgA: <2 [APL'U]/mL (ref <20.0)
Anticardiolipin IgG: <2 [GPL'U]/mL (ref <20.0)
Anticardiolipin IgM: <2 [MPL'U]/mL (ref <20.0)

## 2024-05-29 LAB — PARATHYROID HORMONE, INTACT (NO CA): PTH: 44 pg/mL (ref 16–77)

## 2024-05-29 LAB — PHOSPHORUS: Phosphorus: 4.3 mg/dL (ref 2.1–4.3)

## 2024-05-29 LAB — SJOGRENS SYNDROME-A EXTRACTABLE NUCLEAR ANTIBODY: SSA (Ro) (ENA) Antibody, IgG: <1 AI

## 2024-05-29 LAB — RNP ANTIBODY: Ribonucleic Protein(ENA) Antibody, IgG: <1 AI

## 2024-05-29 NOTE — Progress Notes (Signed)
 I will discuss results at the follow-up visit.  Most the labs are unremarkable.  IFE is pending.

## 2024-05-30 ENCOUNTER — Encounter: Payer: Self-pay | Admitting: Family Medicine

## 2024-05-30 ENCOUNTER — Other Ambulatory Visit: Payer: Self-pay | Admitting: Pharmacist

## 2024-05-30 NOTE — Progress Notes (Signed)
 Error

## 2024-05-30 NOTE — Progress Notes (Signed)
 All the labs are within normal limits.  I will discuss results at the follow-up visit. Devki, please ask patient if she is ready to apply for [facility] for the treatment of osteoporosis and vertebral fractures.

## 2024-06-18 DIAGNOSIS — D225 Melanocytic nevi of trunk: Secondary | ICD-10-CM | POA: Diagnosis not present

## 2024-06-18 DIAGNOSIS — L821 Other seborrheic keratosis: Secondary | ICD-10-CM | POA: Diagnosis not present

## 2024-06-18 DIAGNOSIS — D2262 Melanocytic nevi of left upper limb, including shoulder: Secondary | ICD-10-CM | POA: Diagnosis not present

## 2024-06-18 DIAGNOSIS — D2239 Melanocytic nevi of other parts of face: Secondary | ICD-10-CM | POA: Diagnosis not present

## 2024-06-22 ENCOUNTER — Ambulatory Visit: Admitting: Rheumatology

## 2024-06-26 NOTE — Progress Notes (Signed)
 Office Visit Note  Patient: Sandra Day             Date of Birth: [birth year]           MRN: [id number]             PCP: Cleotilde Planas, MD Referring: Cleotilde Planas, MD Visit Date: 07/10/2024 Occupation: Data Unavailable  Subjective:  Discuss treatment for osteoporosis  History of Present Illness: Sandra Day is a 78 y.o. female with positive ANA, osteoarthritis, degenerative disc disease and osteoporosis.  She returns today after her initial visit on May 24, 2024.  She states after her last visit she was having increased pain and discomfort in her left knee joint.  She was seen by orthopedics and after the viscosupplement injections her pain gradually improved.  She continues to have some pain and swelling due to meniscal tear.  She continues to have some pain and stiffness in her hands.  She states she has been doing hand muscle strength exercises which are helpful.  She is some stiffness in her neck and lower back which is manageable.  She states she reviewed all the medication side effects and she is ready to start on Evenity.  She denies any current dental issues.  She has an appointment coming up with the dentist.  She has been exercising on a regular basis.    Activities of Daily Living:  Patient reports morning stiffness for 0 minutes.   Patient Reports nocturnal pain.  Difficulty dressing/grooming: Denies Difficulty climbing stairs: Denies Difficulty getting out of chair: Denies Difficulty using hands for taps, buttons, cutlery, and/or writing: Denies  Review of Systems  Constitutional:  Positive for fatigue.  HENT:  Negative for mouth sores and mouth dryness.   Eyes:  Positive for dryness.  Respiratory:  Negative for shortness of breath.   Cardiovascular:  Negative for chest pain and palpitations.  Gastrointestinal:  Negative for blood in stool, constipation and diarrhea.  Endocrine: Negative for increased urination.  Genitourinary:  Negative for involuntary  urination.  Musculoskeletal:  Positive for joint pain, joint pain, joint swelling and muscle weakness. Negative for gait problem, myalgias, morning stiffness, muscle tenderness and myalgias.  Skin:  Negative for color change, rash, hair loss and sensitivity to sunlight.  Allergic/Immunologic: Negative for susceptible to infections.  Neurological:  Positive for dizziness. Negative for headaches.  Hematological:  Negative for swollen glands.  Psychiatric/Behavioral:  Positive for depressed mood. Negative for sleep disturbance. The patient is nervous/anxious.     PMFS History:  Patient Active Problem List   Diagnosis Date Noted   Cervical spondylosis with myelopathy 02/21/2023   Pancytopenia (HCC) 03/22/2017   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   Arthritis of both knees 03/22/2017   Osteoporosis 03/22/2017    Past Medical History:  Diagnosis Date   Anxiety    Arthritis of both knees 03/22/2017   Asthma    as a child, no issues in adulthood   Depression    Fatty liver    GERD (gastroesophageal reflux disease)    Hypertension    Multiple thyroid  nodules    Osteoporosis 03/22/2017   Pancytopenia (HCC) 03/22/2017    Family History  Problem Relation Age of Onset   Cervical cancer Mother    Emphysema Father    Breast cancer Sister        in hers 22s   COPD Sister    Past Surgical History:  Procedure Laterality Date   ANTERIOR CERVICAL DECOMP/DISCECTOMY FUSION N/A 02/21/2023  Procedure: Cervical Three-Cervical Four, Cervical Four - Cervical Five, Cervical Five-Cervical Six  Anterior Cervical Discectomy Fusion;  Surgeon: Colon Shove, MD;  Location: [facility] OR;  Service: Neurosurgery;  Laterality: N/A;  RM 21 3C   KNEE SURGERY Left    Social History   Tobacco Use   Smoking status: Former    Current packs/day: 0.00    Types: Cigarettes    Quit date: 08/30/1978    Years since quitting: 45.8    Passive exposure: Past   Smokeless tobacco: Never   Tobacco comments:    Quit  x 30 years.  Vaping Use   Vaping status: Never Used  Substance Use Topics   Alcohol use: Yes    Comment: maybe one drink once a month, socially   Drug use: No   Social History   Social History Narrative   Not on file     Immunization History  Administered Date(s) Administered   PFIZER(Purple Top)SARS-COV-2 Vaccination 09/27/2019, 10/23/2019     Objective: Vital Signs: BP (!) 150/77   Pulse 64   Temp 97.6 F (36.4 C)   Resp 14   Ht 5' 6 (1.676 m)   Wt 127 lb (57.6 kg)   BMI 20.50 kg/m    Physical Exam Vitals and nursing note reviewed.  Constitutional:      Appearance: She is well-developed.  HENT:     Head: Normocephalic and atraumatic.  Eyes:     Conjunctiva/sclera: Conjunctivae normal.  Cardiovascular:     Rate and Rhythm: Normal rate and regular rhythm.     Heart sounds: Normal heart sounds.  Pulmonary:     Effort: Pulmonary effort is normal.     Breath sounds: Normal breath sounds.  Abdominal:     General: Bowel sounds are normal.     Palpations: Abdomen is soft.  Musculoskeletal:     Cervical back: Normal range of motion.  Lymphadenopathy:     Cervical: No cervical adenopathy.  Skin:    General: Skin is warm and dry.     Capillary Refill: Capillary refill takes less than 2 seconds.  Neurological:     Mental Status: She is alert and oriented to person, place, and time.  Psychiatric:        Behavior: Behavior normal.      Musculoskeletal Exam: She had limited lateral rotation of the cervical spine.  She had lumbar scoliosis.  She had no tenderness over thoracic or lumbar spine.  Shoulders, elbows, wrist, MCPs PIPs and DIPs were in good range of motion.  She had hypermobility in most of her joints.  PIP and DIP thickening was noted.  Subluxation of some of the DIP joints was noted.  Hip joints and knee joints were in good range of motion.  She had warmth swelling and effusion in her left knee joint.  She had no tenderness over her ankles or  MTPs.  CDAI Exam: CDAI Score: -- Patient Global: --; Provider Global: -- Swollen: --; Tender: -- Joint Exam 07/10/2024   No joint exam has been documented for this visit   There is currently no information documented on the homunculus. Go to the Rheumatology activity and complete the homunculus joint exam.  Investigation: No additional findings.  Imaging: No results found.  Recent Labs: Lab Results  Component Value Date   WBC 5.9 02/11/2023   HGB 12.9 02/11/2023   PLT 239 02/11/2023   NA 139 02/11/2023   K 4.5 02/11/2023   CL 106 02/11/2023   CO2 25  02/11/2023   GLUCOSE 90 02/11/2023   BUN 17 02/11/2023   CREATININE 0.92 02/11/2023   BILITOT 1.0 02/11/2023   ALKPHOS 48 02/11/2023   AST 17 02/11/2023   ALT 13 02/11/2023   PROT 7.3 05/24/2024   ALBUMIN  4.1 02/11/2023   CALCIUM  9.4 02/11/2023   GFRAA 80 03/22/2017   May 24, 2024 IFE normal, TSH normal, CK13, phosphorus 4.3, PTH 44, ENA (RNP, SSA, SSB, dsDNA, Smith) negative, C3-C4 normal, urine protein creatinine ratio normal, beta-2 GP 1 negative, anticardiolipin negative, anti-CCP negative   November 24, 2023 CRP<1.0, RPR nonreactive, vitamin D35.9, sed rate 1, CBC WBC 5.3, hemoglobin 13.4, platelets 247 CMP creatinine 0.89, GFR 67, AST 15, ALT 8 normal, ANA 1: 160 NS, RF<10  Speciality Comments: No specialty comments available.  Procedures:  No procedures performed Allergies: Sulfa antibiotics and Penicillins   Assessment / Plan:     Visit Diagnoses: Positive ANA (antinuclear antibody) -ANA is low titer positive.  ENA panel including RNP, SSA, SSB, dsDNA and Smith were negative.  Complements were normal.  Beta-2 GP 1 and anticardiolipin were negative.  Urine protein creatinine ratio was normal.  Labs were reviewed with the patient.  She has no clinical symptoms of lupus or similar disease.  I advised her to contact us  if she develops any new symptoms.  Polyarthralgia - For the last 3 years she has been  experiencing pain in her lower back, elbows, wrist, hands, knees, ankles and her feet.  No synovitis was noted on the examination except for pain and swelling in her left knee from the meniscal tear.  Anti-CCP was negative.  Other fatigue - TSH, CK, SPEP normal.  Pain in both hands -she has bilateral PIP and DIP thickening.  She also have hypermobility in her hands.  She has been doing joint muscle strengthening exercises which has been helpful.  Clinical and radiographic findings are suggestive of osteoarthritis.  Chronic pain of both knees - Followed at [facility] for osteoarthritis and meniscal tear of the left knee.  She gets viscosupplement injections, last one August 2025.  She continues to have pain and swelling in her left knee joint and Baker's cyst.  The swelling is related to meniscal tear per patient.  Primary osteoarthritis of both feet -she denies any discomfort today.  Clinical and radiographic findings with history of osteoarthritis.  DDD (degenerative disc disease), cervical -she had limited range of motion of the cervical spine.  Followed by Dr. Colon C3-C6 ACDF June 2024 with limited range of motion.  Lumbar spondylosis-she denies any lower back pain today.  Other idiopathic scoliosis, lumbar region  Hypermobility of joint-joint protection and isometric exercises were discussed.  Age-related osteoporosis without current pathological fracture - Treated with Fosamax intermittently. -August 29, 2023 DEXA scan:The BMD measured at [facility] L1-L4 is 0.850 g/cm2 with a T-score of -2.8.  We discussed Evenity at the last visit.  She reviewed the side effects and wants to proceed with Evenity.  A handout was given and consent was taken today.  Will proceed with Evenity.  Labs obtained at the last visit including phosphorus, PTH were normal.  November 24, 2023 vitamin D was 35.9.  History of vertebral fracture - T11, T12 and L2 compression deformities were noted on previous  x-rays.  Other medical problems are listed as follows:  Dyslipidemia  Multinodular goiter (nontoxic)  Anxiety and depression  Former smoker - She quit smoking in 1983.  She smoked 1 pack/day for 30 years.  History of recent  fall - Patient was referred to physical therapy for lower extremity muscle strengthening and fall prevention.  Orders: No orders of the defined types were placed in this encounter.  No orders of the defined types were placed in this encounter.    Follow-Up Instructions: Return in about 6 months (around 01/07/2025) for Osteoporosis, Osteoarthritis.   Maya Nash, MD  Note - This record has been created using Animal nutritionist.  Chart creation errors have been sought, but may not always  have been located. Such creation errors do not reflect on  the standard of medical care.

## 2024-06-27 DIAGNOSIS — M25561 Pain in right knee: Secondary | ICD-10-CM | POA: Diagnosis not present

## 2024-06-27 DIAGNOSIS — M25562 Pain in left knee: Secondary | ICD-10-CM | POA: Diagnosis not present

## 2024-07-05 ENCOUNTER — Ambulatory Visit (HOSPITAL_BASED_OUTPATIENT_CLINIC_OR_DEPARTMENT_OTHER): Admitting: Physical Therapy

## 2024-07-10 ENCOUNTER — Ambulatory Visit (HOSPITAL_BASED_OUTPATIENT_CLINIC_OR_DEPARTMENT_OTHER): Attending: Rheumatology | Admitting: Rheumatology

## 2024-07-10 ENCOUNTER — Other Ambulatory Visit: Payer: Self-pay | Admitting: Pharmacist

## 2024-07-10 ENCOUNTER — Encounter: Payer: Self-pay | Admitting: Rheumatology

## 2024-07-10 VITALS — BP 150/77 | HR 64 | Temp 97.6°F | Resp 14 | Ht 66.0 in | Wt 127.0 lb

## 2024-07-10 DIAGNOSIS — R7689 Other specified abnormal immunological findings in serum: Secondary | ICD-10-CM

## 2024-07-10 DIAGNOSIS — E042 Nontoxic multinodular goiter: Secondary | ICD-10-CM

## 2024-07-10 DIAGNOSIS — M25561 Pain in right knee: Secondary | ICD-10-CM

## 2024-07-10 DIAGNOSIS — M79642 Pain in left hand: Secondary | ICD-10-CM

## 2024-07-10 DIAGNOSIS — M79641 Pain in right hand: Secondary | ICD-10-CM | POA: Diagnosis not present

## 2024-07-10 DIAGNOSIS — Z8781 Personal history of (healed) traumatic fracture: Secondary | ICD-10-CM

## 2024-07-10 DIAGNOSIS — R5383 Other fatigue: Secondary | ICD-10-CM | POA: Diagnosis not present

## 2024-07-10 DIAGNOSIS — M2550 Pain in unspecified joint: Secondary | ICD-10-CM

## 2024-07-10 DIAGNOSIS — E785 Hyperlipidemia, unspecified: Secondary | ICD-10-CM

## 2024-07-10 DIAGNOSIS — F32A Depression, unspecified: Secondary | ICD-10-CM

## 2024-07-10 DIAGNOSIS — M19072 Primary osteoarthritis, left ankle and foot: Secondary | ICD-10-CM

## 2024-07-10 DIAGNOSIS — Z9181 History of falling: Secondary | ICD-10-CM

## 2024-07-10 DIAGNOSIS — M810 Age-related osteoporosis without current pathological fracture: Secondary | ICD-10-CM

## 2024-07-10 DIAGNOSIS — M47816 Spondylosis without myelopathy or radiculopathy, lumbar region: Secondary | ICD-10-CM

## 2024-07-10 DIAGNOSIS — M19071 Primary osteoarthritis, right ankle and foot: Secondary | ICD-10-CM

## 2024-07-10 DIAGNOSIS — F419 Anxiety disorder, unspecified: Secondary | ICD-10-CM

## 2024-07-10 DIAGNOSIS — M25562 Pain in left knee: Secondary | ICD-10-CM

## 2024-07-10 DIAGNOSIS — Z87891 Personal history of nicotine dependence: Secondary | ICD-10-CM

## 2024-07-10 DIAGNOSIS — M249 Joint derangement, unspecified: Secondary | ICD-10-CM

## 2024-07-10 DIAGNOSIS — M5030 Other cervical disc degeneration, unspecified cervical region: Secondary | ICD-10-CM

## 2024-07-10 DIAGNOSIS — G8929 Other chronic pain: Secondary | ICD-10-CM

## 2024-07-10 DIAGNOSIS — M4126 Other idiopathic scoliosis, lumbar region: Secondary | ICD-10-CM

## 2024-07-10 NOTE — Progress Notes (Signed)
 Pharmacy Note  Subjective:  Patient presents today to [facility] for follow up office visit.   Patient was seen by the pharmacist for counseling on Evenity.   History of cardiovascular events in the past 12 months? No  Objective: CMP     Component Value Date/Time   NA 139 02/11/2023 1155   NA 135 03/22/2017 1449   K 4.5 02/11/2023 1155   CL 106 02/11/2023 1155   CO2 25 02/11/2023 1155   GLUCOSE 90 02/11/2023 1155   BUN 17 02/11/2023 1155   BUN 13 03/22/2017 1449   CREATININE 0.92 02/11/2023 1155   CALCIUM  9.4 02/11/2023 1155   PROT 7.3 05/24/2024 0922   PROT 7.1 04/14/2017 0939   ALBUMIN  4.1 02/11/2023 1155   ALBUMIN  4.4 04/14/2017 0939   AST 17 02/11/2023 1155   ALT 13 02/11/2023 1155   ALKPHOS 48 02/11/2023 1155   BILITOT 1.0 02/11/2023 1155   BILITOT 0.3 04/14/2017 0939   GFRNONAA >60 02/11/2023 1155   GFRAA 80 03/22/2017 1449    Vitamin D - 35.9 in 10/2023  DEXA scan, most current DEXA (08/29/2023) scan T-score is -2.8 spine, -2.7 femur neck, and -2 femur total mean.  Assessment/Plan:  Counseled patient on purpose, proper use, and adverse effects of Evenity.  Counseled patient that Donella is a medication that must be injected once monthly for 12 months by a healthcare professional.  Advised patient to take calcium  1200 mg daily (600 mg per dose) and vitamin D 800 units daily.  Reviewed the most common adverse effects of Evenity including headache, osteonecrosis of the jaw, and muscle/bone pain.  Reviewed that Donella has FDA boxed warning for major adverse cardiovascular events. Reviewed that Evenity may increase the risk of myocardial infarction, stroke, and cardiovascular death. Confirmed that patient has not had MI or stroke within the preceding year.  Patient confirms she does not have any major dental work planned at this time.  Reviewed with patient the signs/symptoms of low calcium  and advised patient to alert us  if she experiences these symptoms.   Provided patient with medication education material and answered all questions. Will apply for Evenity through patient's insurance.   Powell Gallus, PharmD, MPH Pharmacy Resident

## 2024-07-10 NOTE — Patient Instructions (Signed)
 Romosozumab Injection  What is this medication?  ROMOSOZUMAB (roe moe SOZ ue mab) prevents and treats osteoporosis. It works by Interior and spatial designer stronger and less likely to break (fracture). It is a monoclonal antibody.  This medicine may be used for other purposes; ask your health care provider or pharmacist if you have questions.  COMMON BRAND NAME(S): EVENITY  What should I tell my care team before I take this medication?  They need to know if you have any of these conditions:  Dental disease  Heart attack  Heart disease  Kidney problems  Low levels of calcium in the blood  On dialysis  Stroke  Wear dentures  An unusual or allergic reaction to romosozumab, other medications, foods, dyes or preservatives  Pregnant or trying to get pregnant  Breast-feeding  How should I use this medication?  This medication is injected under the skin. It is given by your care team in a hospital or clinic setting.  A special MedGuide will be given to you by the pharmacist with each prescription and refill. Be sure to read this information carefully each time.  Talk to your care team about the use of this medication in children. Special care may be needed.  Overdosage: If you think you have taken too much of this medicine contact a poison control center or emergency room at once.  NOTE: This medicine is only for you. Do not share this medicine with others.  What if I miss a dose?  Keep appointments for follow-up doses. It is important not to miss your dose. Call your care team if you are unable to keep an appointment.  What may interact with this medication?  Interactions are not expected.  This list may not describe all possible interactions. Give your health care provider a list of all the medicines, herbs, non-prescription drugs, or dietary supplements you use. Also tell them if you smoke, drink alcohol, or use illegal drugs. Some items may interact with your medicine.  What should I watch for while using this medication?  Your  condition will be monitored carefully while you are receiving this medication.  You may need bloodwork while taking this medication.  You should make sure you get enough calcium and vitamin D while you are taking this medication. Discuss the foods you eat and the vitamins you take with your care team.  Some people who take this medication have severe bone, joint, or muscle pain. This medication may also increase your risk for jaw problems or a broken thigh bone. Tell your care team right away if you have severe pain in your jaw, bones, joints, or muscles. Tell you care team if you have any pain that does not go away or that gets worse.  Tell your dentist and dental surgeon that you are taking this medication. You should not have major dental surgery while on this medication. See your dentist to have a dental exam and fix any dental problems before starting this medication. Take good care of your teeth while on this medication. Make sure you see your dentist for regular follow-up appointments.  What side effects may I notice from receiving this medication?  Side effects that you should report to your care team as soon as possible:  Allergic reactions or angioedema--skin rash, itching or hives, swelling of the face, eyes, lips, tongue, arms, or legs, trouble swallowing or breathing  Heart attack--pain or tightness in the chest, shoulders, arms, or jaw, nausea, shortness of breath, cold or clammy skin,  feeling faint or lightheaded  Low calcium level--muscle pain or cramps, confusion, tingling, or numbness in the hands or feet  Osteonecrosis of the jaw--pain, swelling, or redness in the mouth, numbness of the jaw, poor healing after dental work, unusual discharge from the mouth, visible bones in the mouth  Severe bone, joint, or muscle pain  Stroke--sudden numbness or weakness of the face, arm, or leg, trouble speaking, confusion, trouble walking, loss of balance or coordination, dizziness, severe headache, change in  vision  Side effects that usually do not require medical attention (report to your care team if they continue or are bothersome):  Headache  Joint pain  Muscle spasms  Pain, redness, or irritation at injection site  Swelling of the ankles, hands, or feet  This list may not describe all possible side effects. Call your doctor for medical advice about side effects. You may report side effects to FDA at 1-800-FDA-1088.  Where should I keep my medication?  This medication is given in a hospital or clinic. It will not be stored at home.  NOTE: This sheet is a summary. It may not cover all possible information. If you have questions about this medicine, talk to your doctor, pharmacist, or health care provider.   2024 Elsevier/Gold Standard (2021-09-16 00:00:00)

## 2024-07-10 NOTE — Progress Notes (Signed)
 Referral placed for Evenity SQ [phone]) at [facility] [phone]) [place] to start benefits investigation.  Diagnosis: age-related osteoporosis  Provider: Dr. Maya Nash  Dose: 210mg  every month x 12 months  Last Clinic Visit: 07/10/2024 Next Clinic Visit: 01/09/2025  Pertinent baseline labs: calcium , phosphorous, PTH wnl on 05/24/2024  Once benefits are processed, infusion center will contact patient to schedule.  Sherry Pennant, PharmD, MPH, BCPS, CPP Clinical Pharmacist [facility])

## 2024-07-11 ENCOUNTER — Other Ambulatory Visit (HOSPITAL_COMMUNITY): Payer: Self-pay | Admitting: Rheumatology

## 2024-07-11 ENCOUNTER — Telehealth (HOSPITAL_COMMUNITY): Payer: Self-pay

## 2024-07-11 NOTE — Telephone Encounter (Signed)
 Auth Submission: NO AUTH NEEDED Site of care: Site of care: [facility] Payer: BCBS Medicare Medication & CPT/J Code(s) submitted: Evenity (Romosozumab) [id number] Diagnosis Code: M81.0 Route of submission (phone, fax, portal):  Phone # Fax # Auth type: Buy/Bill HB Units/visits requested: 210mg  x 12 doses Reference number:  Approval from: 07/11/24 to 08/29/24

## 2024-07-12 ENCOUNTER — Other Ambulatory Visit: Payer: Self-pay

## 2024-07-12 ENCOUNTER — Encounter (HOSPITAL_BASED_OUTPATIENT_CLINIC_OR_DEPARTMENT_OTHER): Payer: Self-pay | Admitting: Physical Therapy

## 2024-07-12 ENCOUNTER — Ambulatory Visit (HOSPITAL_BASED_OUTPATIENT_CLINIC_OR_DEPARTMENT_OTHER): Admitting: Physical Therapy

## 2024-07-12 DIAGNOSIS — M6281 Muscle weakness (generalized): Secondary | ICD-10-CM

## 2024-07-12 DIAGNOSIS — M5459 Other low back pain: Secondary | ICD-10-CM

## 2024-07-12 DIAGNOSIS — R293 Abnormal posture: Secondary | ICD-10-CM

## 2024-07-12 DIAGNOSIS — R29898 Other symptoms and signs involving the musculoskeletal system: Secondary | ICD-10-CM

## 2024-07-12 NOTE — Therapy (Addendum)
 OUTPATIENT PHYSICAL THERAPY THORACOLUMBAR EVALUATION   Patient Name: Sandra Day MRN: [id number] DOB:Apr 24, 1946, 78 y.o., female Today's Date: 07/12/2024  END OF SESSION:  PT End of Session - 07/12/24 1056     Visit Number 1    Number of Visits 16    Date for Recertification  09/06/24    Authorization Type [employer] MCR    PT Start Time 1100    PT Stop Time 1135    PT Time Calculation (min) 35 min    Activity Tolerance Patient tolerated treatment well    Behavior During Therapy [facility] for tasks assessed/performed          Past Medical History:  Diagnosis Date   Anxiety    Arthritis of both knees 03/22/2017   Asthma    as a child, no issues in adulthood   Depression    Fatty liver    GERD (gastroesophageal reflux disease)    Hypertension    Multiple thyroid  nodules    Osteoporosis 03/22/2017   Pancytopenia (HCC) 03/22/2017   Past Surgical History:  Procedure Laterality Date   ANTERIOR CERVICAL DECOMP/DISCECTOMY FUSION N/A 02/21/2023   Procedure: Cervical Three-Cervical Four, Cervical Four - Cervical Five, Cervical Five-Cervical Six  Anterior Cervical Discectomy Fusion;  Surgeon: Colon Shove, MD;  Location: [facility] OR;  Service: Neurosurgery;  Laterality: N/A;  RM 21 3C   KNEE SURGERY Left    Patient Active Problem List   Diagnosis Date Noted   Cervical spondylosis with myelopathy 02/21/2023   Pancytopenia (HCC) 03/22/2017   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   Arthritis of both knees 03/22/2017   Osteoporosis 03/22/2017    PCP: Cleotilde Planas, MD   REFERRING PROVIDER: Dolphus Reiter, MD   REFERRING DIAG:  M81.0 (ICD-10-CM) - Age-related osteoporosis without current pathological fracture  Z87.81 (ICD-10-CM) - History of vertebral fracture  Z91.81 (ICD-10-CM) - History of recent fall    Rationale for Evaluation and Treatment: Rehabilitation  THERAPY DIAG:  Abnormal posture  Muscle weakness (generalized)  Other symptoms and signs involving the  musculoskeletal system  Other low back pain  ONSET DATE: Chronic  SUBJECTIVE:                                                                                                                                                                                           SUBJECTIVE STATEMENT: States she is mainly here at physical therapy due to the referral. Has OA everywhere and has had several issues and thinks about anything would be beneficial. States she has been like this for awhile. States she is a chronic faller and has had many falls. Most recent fall  was from tripping over branch. Does not exercise and states she does not really like going to the gym.  PERTINENT HISTORY:  Anxiety, depression, arthritis in both knees, osteoporosis, pancytopenia, hypertension, L knee surgery, cervical decompression, double jointed, vertigo  PAIN:  Are you having pain? No  PRECAUTIONS: None  WEIGHT BEARING RESTRICTIONS: No  FALLS:  Has patient fallen in last 6 months? Yes. Number of falls 1  OCCUPATION: [employer]  PLOF: Independent  PATIENT GOALS: Work without limitations, decrease pain   NEXT MD VISIT:   OBJECTIVE: (objective measures from initial evaluation unless otherwise dated)  DIAGNOSTIC FINDINGS:    PATIENT SURVEYS:  LEFS  Extreme difficulty/unable (0), Quite a bit of difficulty (1), Moderate difficulty (2), Little difficulty (3), No difficulty (4) Survey date:  EVAL  Any of your usual work, housework or school activities 4  2. Usual hobbies, recreational or sporting activities 4  3. Getting into/out of the bath 4  4. Walking between rooms 4  5. Putting on socks/shoes 4  6. Squatting  1  7. Lifting an object, like a bag of groceries from the floor 3  8. Performing light activities around your home 4  9. Performing heavy activities around your home 3  10. Getting into/out of a car 3  11. Walking 2 blocks 3  12. Walking 1 mile 2  13. Going up/down 10 stairs (1 flight) 2   14. Standing for 1 hour 2  15.  sitting for 1 hour 4  16. Running on even ground 0  17. Running on uneven ground 0  18. Making sharp turns while running fast 0  19. Hopping  0  20. Rolling over in bed 4  Score total:  51/80    UEFS  Extreme difficulty/unable (0), Quite a bit of difficulty (1), Moderate difficulty (2), Little difficulty (3), No difficulty (4) Survey date:  EVAL  Any of your usual work, household or school activities 3  2. Your usual hobbies, recreational/sport activities 3   3. Lifting a bag of groceries to waist level 2   4. Lifting a bag of groceries above your head 4  5. Grooming your hair 3  6. Pushing up on your hands (I.e. from bathtub or chair) 3  7. Preparing food (I.e. peeling/cutting) 4  8. Driving  4  9. Vacuuming, sweeping, or raking 3  10. Dressing  4  11. Doing up buttons 3  12. Using tools/appliances 3  13. Opening doors 3  14. Cleaning  3  15. Tying or lacing shoes 4  16. Sleeping  4  17. Laundering clothes (I.e. washing, ironing, folding) 4  18. Opening a jar 1  19. Throwing a ball 2  20. Carrying a small suitcase with your affected limb.  2  Score total:  62/80     SCREENING FOR RED FLAGS: Bowel or bladder incontinence: No Spinal tumors: No Cauda equina syndrome: No Compression fracture: No Abdominal aneurysm: No  COGNITION: Overall cognitive status: Within functional limits for tasks assessed     SENSATION: WFL  POSTURE: No Significant postural limitations, rounded shoulders, forward head, increased lumbar lordosis, decreased thoracic kyphosis, and anterior pelvic tilt  PALPATION:   LUMBAR ROM:   AROM EVAL  Flexion WFL  Extension   Right lateral flexion WFL  Left lateral flexion WFL  Right rotation WFL *  Left rotation WFL   (Blank rows = not tested) * = pain/symptoms  UPPER EXTREMITY ROM:   Active ROM Right EVAL Left  EVAL  Shoulder flexion [facility]  Shoulder extension    Shoulder abduction [facility]  Shoulder  adduction    Shoulder internal rotation    Shoulder external rotation    Elbow flexion    Elbow extension    Wrist flexion    Wrist extension    Wrist ulnar deviation    Wrist radial deviation    Wrist pronation    Wrist supination    (Blank rows = not tested) *= pain/symptoms  UPPER EXTREMITY MMT:  MMT Right EVAL Left EVAL  Shoulder flexion 4- 4  Shoulder extension    Shoulder abduction 4- 4  Shoulder adduction    Shoulder internal rotation 4+ 4+  Shoulder external rotation 4- 4-  Middle trapezius    Lower trapezius    Elbow flexion 5 4  Elbow extension    Wrist flexion    Wrist extension    Wrist ulnar deviation    Wrist radial deviation    Wrist pronation    Wrist supination    Grip strength (lbs)    (Blank rows = not tested) *= pain/symptoms   LOWER EXTREMITY ROM:     Active  Right eval Left eval  Hip flexion    Hip extension    Hip abduction    Hip adduction    Hip internal rotation    Hip external rotation    Knee flexion    Knee extension    Ankle dorsiflexion    Ankle plantarflexion    Ankle inversion    Ankle eversion     (Blank rows = not tested) * = pain/symptoms  LOWER EXTREMITY MMT:    MMT Right eval Left eval  Hip flexion 4 4  Hip extension 4 4-  Hip abduction 4+ 4+  Hip adduction    Hip internal rotation    Hip external rotation    Knee flexion 5 4+*  Knee extension 5 5  Ankle dorsiflexion 5 5  Ankle plantarflexion    Ankle inversion    Ankle eversion     (Blank rows = not tested) * = pain/symptoms  FUNCTIONAL TESTS:  5 times sit to stand: 11.50 seconds  GAIT: Distance walked: 50-100 ft from lobby to treatment room Assistive device utilized: None Level of assistance: Complete Independence Comments: Toe out gait on RLE, mild trendelenburg, lumbar lordosis   TODAY'S TREATMENT:                                                                                                                              DATE:  07/12/24   Evaluation Standing hip extension x10 bilaterally  Standing marches x20 Seated clamshell x10 RTB   PATIENT EDUCATION:  Education details: Patient educated on exam findings, POC, scope of PT, HEP, pain science, relevant anatomy, and activity tolerance. Person educated: Patient Education method: Explanation, Demonstration, and Handouts Education comprehension: verbalized understanding, returned demonstration, verbal cues required, and tactile cues required  HOME  EXERCISE PROGRAM: Access Code: [email or web address] URL: [email or web address] Date: 07/12/2024 Prepared by: Lili Finder  Exercises - Standing March with Counter Support  - 1 x daily - 7 x weekly - 3 sets - 10 reps - Standing Hip Extension with Unilateral Counter Support  - 1 x daily - 7 x weekly - 3 sets - 10 reps - Seated Hip Abduction  - 1 x daily - 7 x weekly - 3 sets - 10 reps  ASSESSMENT:  CLINICAL IMPRESSION: Patient a 78 y.o. y.o. female who was seen today for physical therapy evaluation and treatment for general weakness and widespread pain. Patient presents with pain limited deficits in lower and upper extremity strength, ROM, endurance, activity tolerance, and functional mobility with ADL. Patient is having to modify and restrict ADL as indicated by outcome measure score as well as subjective information and objective measures which is affecting overall participation. Patient will benefit from skilled physical therapy in order to improve function and reduce impairment.  OBJECTIVE IMPAIRMENTS: Abnormal gait, decreased activity tolerance, decreased balance, decreased endurance, decreased mobility, difficulty walking, decreased ROM, decreased strength, hypomobility, increased muscle spasms, impaired flexibility, improper body mechanics, postural dysfunction, and pain  ACTIVITY LIMITATIONS: carrying, lifting, bending, standing, squatting, stairs, transfers, bed mobility, reach over head, locomotion level, and caring  for others  PARTICIPATION LIMITATIONS:  meal prep, cleaning, laundry, shopping, community activity, occupation, and yard work  PERSONAL FACTORS: Age, Time since onset of injury/illness/exacerbation, and 3+ comorbidities: Anxiety, depression, arthritis in both knees, osteoporosis, pancytopenia, hypertension, vertigo also affecting patient's functional outcome.   REHAB POTENTIAL: Good  CLINICAL DECISION MAKING: Evolving/moderate complexity  EVALUATION COMPLEXITY: Moderate   GOALS: Goals reviewed with patient? Yes  SHORT TERM GOALS: Target date: 07/26/2024  Patient will be independent with HEP in order to improve functional outcomes. Baseline:  Goal status: INITIAL  2.  Patient will report at least 25% improvement in symptoms for improved quality of life. Baseline:  Goal status: INITIAL  LONG TERM GOALS: Target date: 09/06/2024  Patient will report at least 75% improvement in symptoms for improved quality of life. Baseline:  Goal status: INITIAL  2.  Patient will improve modified oswestry score by at least 10 points in order to indicate improved tolerance to activity. Baseline:  Goal status: INITIAL  3.  Patient will complete 5xSTS in </ 10 seconds to indicate improved fall risk. Baseline:  Goal status: INITIAL  4.  Patient will demonstrate grade of 5/5 MMT grade in all tested musculature as evidence of improved strength to assist with stair ambulation and gait.   Baseline:  Goal status: INITIAL  PLAN:  PT FREQUENCY: 1-2x/week  PT DURATION: 8 weeks  PLANNED INTERVENTIONS: 97164- PT Re-evaluation, 97110-Therapeutic exercises, 97530- Therapeutic activity, [id number]- Neuromuscular re-education, 97535- Self Care, [id number]- Manual therapy, [id number]- Gait training, [phone]- Orthotic Fit/training, [phone]- Canalith repositioning, [id number]- Aquatic Therapy, [phone]- Splinting, [phone]- Wound care (first 20 sq cm), 97598- Wound care (each additional 20 sq cm)Patient/Family education, Balance training,  Stair training, Taping, Dry Needling, Joint mobilization, Joint manipulation, Spinal manipulation, Spinal mobilization, Scar mobilization, and DME instructions.  PLAN FOR NEXT SESSION: plan to progress as tolerate, continue quad and glute strengthening, consider balance screening   Lili Finder, Student-PT 07/12/2024, 11:40 AM   This entire session was performed under direct supervision and direction of a licensed therapist/therapist assistant . I have personally read, edited and approve of the note as written. 11:57 AM, 07/12/24 Prentice CANDIE Stains PT, DPT Physical Therapist at [facility]

## 2024-07-19 ENCOUNTER — Encounter (HOSPITAL_BASED_OUTPATIENT_CLINIC_OR_DEPARTMENT_OTHER): Admitting: Physical Therapy

## 2024-07-23 ENCOUNTER — Encounter: Payer: Self-pay | Admitting: Pharmacist

## 2024-07-24 ENCOUNTER — Ambulatory Visit (HOSPITAL_BASED_OUTPATIENT_CLINIC_OR_DEPARTMENT_OTHER): Payer: Self-pay | Admitting: Physical Therapy

## 2024-07-24 ENCOUNTER — Encounter (HOSPITAL_BASED_OUTPATIENT_CLINIC_OR_DEPARTMENT_OTHER): Payer: Self-pay | Admitting: Physical Therapy

## 2024-07-24 DIAGNOSIS — M6281 Muscle weakness (generalized): Secondary | ICD-10-CM

## 2024-07-24 DIAGNOSIS — R252 Cramp and spasm: Secondary | ICD-10-CM

## 2024-07-24 DIAGNOSIS — M5459 Other low back pain: Secondary | ICD-10-CM

## 2024-07-24 DIAGNOSIS — R293 Abnormal posture: Secondary | ICD-10-CM

## 2024-07-24 DIAGNOSIS — R29898 Other symptoms and signs involving the musculoskeletal system: Secondary | ICD-10-CM

## 2024-07-24 NOTE — Therapy (Signed)
 OUTPATIENT PHYSICAL THERAPY THORACOLUMBAR TREATMENT    Patient Name: Sandra Day MRN: [id number] DOB:[birth year], 78 y.o., female Today's Date: 07/24/2024  END OF SESSION:  PT End of Session - 07/24/24 1310     Visit Number 2    Number of Visits 16    Date for Recertification  09/06/24    Authorization Type [employer] MCR    Progress Note Due on Visit 10    PT Start Time 1303    PT Stop Time 1342    PT Time Calculation (min) 39 min    Activity Tolerance Patient tolerated treatment well    Behavior During Therapy [facility] for tasks assessed/performed           Past Medical History:  Diagnosis Date   Anxiety    Arthritis of both knees 03/22/2017   Asthma    as a child, no issues in adulthood   Depression    Fatty liver    GERD (gastroesophageal reflux disease)    Hypertension    Multiple thyroid  nodules    Osteoporosis 03/22/2017   Pancytopenia (HCC) 03/22/2017   Past Surgical History:  Procedure Laterality Date   ANTERIOR CERVICAL DECOMP/DISCECTOMY FUSION N/A 02/21/2023   Procedure: Cervical Three-Cervical Four, Cervical Four - Cervical Five, Cervical Five-Cervical Six  Anterior Cervical Discectomy Fusion;  Surgeon: Colon Shove, MD;  Location: [facility] OR;  Service: Neurosurgery;  Laterality: N/A;  RM 21 3C   KNEE SURGERY Left    Patient Active Problem List   Diagnosis Date Noted   Cervical spondylosis with myelopathy 02/21/2023   Pancytopenia (HCC) 03/22/2017   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   Arthritis of both knees 03/22/2017   Osteoporosis 03/22/2017    PCP: Cleotilde Planas, MD   REFERRING PROVIDER: Dolphus Reiter, MD   REFERRING DIAG:  M81.0 (ICD-10-CM) - Age-related osteoporosis without current pathological fracture  Z87.81 (ICD-10-CM) - History of vertebral fracture  Z91.81 (ICD-10-CM) - History of recent fall    Rationale for Evaluation and Treatment: Rehabilitation  THERAPY DIAG:  Abnormal posture  Muscle weakness (generalized)  Other  symptoms and signs involving the musculoskeletal system  Other low back pain  Cramp and spasm  ONSET DATE: Chronic  SUBJECTIVE:                                                                                                                                                                                           SUBJECTIVE STATEMENT:  Nothing new, everything is feeling good. HEP is OK, not going as well as they should but I'll get on track    EVAL: States she is mainly here at physical therapy  due to the referral. Has OA everywhere and has had several issues and thinks about anything would be beneficial. States she has been like this for awhile. States she is a chronic faller and has had many falls. Most recent fall was from tripping over branch. Does not exercise and states she does not really like going to the gym.  PERTINENT HISTORY:  Anxiety, depression, arthritis in both knees, osteoporosis, pancytopenia, hypertension, L knee surgery, cervical decompression, double jointed, vertigo  PAIN:  Are you having pain? No 0/10 not really pain but I feel very stiff for some reason   PRECAUTIONS: None  WEIGHT BEARING RESTRICTIONS: No  FALLS:  Has patient fallen in last 6 months? Yes. Number of falls 1  OCCUPATION: Dog [employer]  PLOF: Independent  PATIENT GOALS: Work without limitations, decrease pain   NEXT MD VISIT:   OBJECTIVE: (objective measures from initial evaluation unless otherwise dated)  DIAGNOSTIC FINDINGS:    PATIENT SURVEYS:  LEFS  Extreme difficulty/unable (0), Quite a bit of difficulty (1), Moderate difficulty (2), Little difficulty (3), No difficulty (4) Survey date:  EVAL  Any of your usual work, housework or school activities 4  2. Usual hobbies, recreational or sporting activities 4  3. Getting into/out of the bath 4  4. Walking between rooms 4  5. Putting on socks/shoes 4  6. Squatting  1  7. Lifting an object, like a bag of groceries from the floor  3  8. Performing light activities around your home 4  9. Performing heavy activities around your home 3  10. Getting into/out of a car 3  11. Walking 2 blocks 3  12. Walking 1 mile 2  13. Going up/down 10 stairs (1 flight) 2  14. Standing for 1 hour 2  15.  sitting for 1 hour 4  16. Running on even ground 0  17. Running on uneven ground 0  18. Making sharp turns while running fast 0  19. Hopping  0  20. Rolling over in bed 4  Score total:  51/80    UEFS  Extreme difficulty/unable (0), Quite a bit of difficulty (1), Moderate difficulty (2), Little difficulty (3), No difficulty (4) Survey date:  EVAL  Any of your usual work, household or school activities 3  2. Your usual hobbies, recreational/sport activities 3   3. Lifting a bag of groceries to waist level 2   4. Lifting a bag of groceries above your head 4  5. Grooming your hair 3  6. Pushing up on your hands (I.e. from bathtub or chair) 3  7. Preparing food (I.e. peeling/cutting) 4  8. Driving  4  9. Vacuuming, sweeping, or raking 3  10. Dressing  4  11. Doing up buttons 3  12. Using tools/appliances 3  13. Opening doors 3  14. Cleaning  3  15. Tying or lacing shoes 4  16. Sleeping  4  17. Laundering clothes (I.e. washing, ironing, folding) 4  18. Opening a jar 1  19. Throwing a ball 2  20. Carrying a small suitcase with your affected limb.  2  Score total:  62/80     SCREENING FOR RED FLAGS: Bowel or bladder incontinence: No Spinal tumors: No Cauda equina syndrome: No Compression fracture: No Abdominal aneurysm: No  COGNITION: Overall cognitive status: Within functional limits for tasks assessed     SENSATION: WFL  POSTURE: No Significant postural limitations, rounded shoulders, forward head, increased lumbar lordosis, decreased thoracic kyphosis, and anterior pelvic tilt  PALPATION:  LUMBAR ROM:   AROM EVAL  Flexion WFL  Extension   Right lateral flexion WFL  Left lateral flexion WFL  Right  rotation WFL *  Left rotation WFL   (Blank rows = not tested) * = pain/symptoms  UPPER EXTREMITY ROM:   Active ROM Right EVAL Left EVAL  Shoulder flexion [facility]  Shoulder extension    Shoulder abduction [facility]  Shoulder adduction    Shoulder internal rotation    Shoulder external rotation    Elbow flexion    Elbow extension    Wrist flexion    Wrist extension    Wrist ulnar deviation    Wrist radial deviation    Wrist pronation    Wrist supination    (Blank rows = not tested) *= pain/symptoms  UPPER EXTREMITY MMT:  MMT Right EVAL Left EVAL  Shoulder flexion 4- 4  Shoulder extension    Shoulder abduction 4- 4  Shoulder adduction    Shoulder internal rotation 4+ 4+  Shoulder external rotation 4- 4-  Middle trapezius    Lower trapezius    Elbow flexion 5 4  Elbow extension    Wrist flexion    Wrist extension    Wrist ulnar deviation    Wrist radial deviation    Wrist pronation    Wrist supination    Grip strength (lbs)    (Blank rows = not tested) *= pain/symptoms   LOWER EXTREMITY ROM:     Active  Right eval Left eval  Hip flexion    Hip extension    Hip abduction    Hip adduction    Hip internal rotation    Hip external rotation    Knee flexion    Knee extension    Ankle dorsiflexion    Ankle plantarflexion    Ankle inversion    Ankle eversion     (Blank rows = not tested) * = pain/symptoms  LOWER EXTREMITY MMT:    MMT Right eval Left eval  Hip flexion 4 4  Hip extension 4 4-  Hip abduction 4+ 4+  Hip adduction    Hip internal rotation    Hip external rotation    Knee flexion 5 4+*  Knee extension 5 5  Ankle dorsiflexion 5 5  Ankle plantarflexion    Ankle inversion    Ankle eversion     (Blank rows = not tested) * = pain/symptoms  FUNCTIONAL TESTS:  5 times sit to stand: 11.50 seconds     07/24/24 0001  Standardized Balance Assessment  Standardized Balance Assessment Dynamic Gait Index  Dynamic Gait Index  Level Surface  3  Change in Gait Speed 3  Gait with Horizontal Head Turns 2  Gait with Vertical Head Turns 2  Gait and Pivot Turn 2  Step Over Obstacle 3  Step Around Obstacles 3  Steps 2  Total Score 20      GAIT: Distance walked: 50-100 ft from lobby to treatment room Assistive device utilized: None Level of assistance: Complete Independence Comments: Toe out gait on RLE, mild trendelenburg, lumbar lordosis   TODAY'S TREATMENT:  DATE:   07/24/24  Nustep L4x8 minutes all four extremities seat 8  DGI 20/24 Tandem stance 6x30 seconds alternating Narrow BOS blue foam EO 3x30 seconds Tandem walks at bar x2 laps, intermittent MinA   Bridges + ABD red TB x12 Sidelying clams red TB x12 B Hip hikes x12     07/12/24  Evaluation Standing hip extension x10 bilaterally  Standing marches x20 Seated clamshell x10 RTB   PATIENT EDUCATION:  Education details: Patient educated on exam findings, POC, scope of PT, HEP, pain science, relevant anatomy, and activity tolerance. Person educated: Patient Education method: Explanation, Demonstration, and Handouts Education comprehension: verbalized understanding, returned demonstration, verbal cues required, and tactile cues required  HOME EXERCISE PROGRAM: Access Code: [email or web address] URL: [email or web address] Date: 07/12/2024 Prepared by: Lili Finder  Exercises - Standing March with Counter Support  - 1 x daily - 7 x weekly - 3 sets - 10 reps - Standing Hip Extension with Unilateral Counter Support  - 1 x daily - 7 x weekly - 3 sets - 10 reps - Seated Hip Abduction  - 1 x daily - 7 x weekly - 3 sets - 10 reps  ASSESSMENT:  CLINICAL IMPRESSION:  Arrives today doing OK, we warmed up on the Nustep and then completed DGI due to chronic issues with falling. Otherwise worked on a healthy mix of functional  strengthening and balance training this session. Anticipate she will respond well to skilled services moving forward.     EVAL: Patient a 78 y.o. y.o. female who was seen today for physical therapy evaluation and treatment for general weakness and widespread pain. Patient presents with pain limited deficits in lower and upper extremity strength, ROM, endurance, activity tolerance, and functional mobility with ADL. Patient is having to modify and restrict ADL as indicated by outcome measure score as well as subjective information and objective measures which is affecting overall participation. Patient will benefit from skilled physical therapy in order to improve function and reduce impairment.  OBJECTIVE IMPAIRMENTS: Abnormal gait, decreased activity tolerance, decreased balance, decreased endurance, decreased mobility, difficulty walking, decreased ROM, decreased strength, hypomobility, increased muscle spasms, impaired flexibility, improper body mechanics, postural dysfunction, and pain  ACTIVITY LIMITATIONS: carrying, lifting, bending, standing, squatting, stairs, transfers, bed mobility, reach over head, locomotion level, and caring for others  PARTICIPATION LIMITATIONS:  meal prep, cleaning, laundry, shopping, community activity, occupation, and yard work  PERSONAL FACTORS: Age, Time since onset of injury/illness/exacerbation, and 3+ comorbidities: Anxiety, depression, arthritis in both knees, osteoporosis, pancytopenia, hypertension, vertigo also affecting patient's functional outcome.   REHAB POTENTIAL: Good  CLINICAL DECISION MAKING: Evolving/moderate complexity  EVALUATION COMPLEXITY: Moderate   GOALS: Goals reviewed with patient? Yes  SHORT TERM GOALS: Target date: 07/26/2024  Patient will be independent with HEP in order to improve functional outcomes. Baseline:  Goal status: INITIAL  2.  Patient will report at least 25% improvement in symptoms for improved quality of  life. Baseline:  Goal status: INITIAL  LONG TERM GOALS: Target date: 09/06/2024  Patient will report at least 75% improvement in symptoms for improved quality of life. Baseline:  Goal status: INITIAL  2.  Patient will improve modified oswestry score by at least 10 points in order to indicate improved tolerance to activity. Baseline:  Goal status: INITIAL  3.  Patient will complete 5xSTS in </ 10 seconds to indicate improved fall risk. Baseline:  Goal status: INITIAL  4.  Patient will demonstrate grade of 5/5 MMT grade in all tested musculature as evidence of  improved strength to assist with stair ambulation and gait.   Baseline:  Goal status: INITIAL  PLAN:  PT FREQUENCY: 1-2x/week  PT DURATION: 8 weeks  PLANNED INTERVENTIONS: 97164- PT Re-evaluation, 97110-Therapeutic exercises, 97530- Therapeutic activity, [id number]- Neuromuscular re-education, 97535- Self Care, [id number]- Manual therapy, [id number]- Gait training, [phone]- Orthotic Fit/training, [phone]- Canalith repositioning, [id number]- Aquatic Therapy, [phone]- Splinting, [phone]- Wound care (first 20 sq cm), 97598- Wound care (each additional 20 sq cm)Patient/Family education, Balance training, Stair training, Taping, Dry Needling, Joint mobilization, Joint manipulation, Spinal manipulation, Spinal mobilization, Scar mobilization, and DME instructions.  PLAN FOR NEXT SESSION: plan to progress as tolerate, continue quad and glute strengthening. Consider adding ankle strength HEP to help balance   Josette Rough, PT, DPT 07/24/24 1:44 PM

## 2024-07-30 ENCOUNTER — Ambulatory Visit (HOSPITAL_BASED_OUTPATIENT_CLINIC_OR_DEPARTMENT_OTHER)

## 2024-08-02 ENCOUNTER — Encounter (HOSPITAL_BASED_OUTPATIENT_CLINIC_OR_DEPARTMENT_OTHER): Admitting: Physical Therapy

## 2024-08-07 ENCOUNTER — Ambulatory Visit (HOSPITAL_BASED_OUTPATIENT_CLINIC_OR_DEPARTMENT_OTHER): Attending: Rheumatology

## 2024-08-07 ENCOUNTER — Encounter (HOSPITAL_BASED_OUTPATIENT_CLINIC_OR_DEPARTMENT_OTHER): Payer: Self-pay

## 2024-08-07 DIAGNOSIS — M5459 Other low back pain: Secondary | ICD-10-CM | POA: Insufficient documentation

## 2024-08-07 DIAGNOSIS — M6281 Muscle weakness (generalized): Secondary | ICD-10-CM | POA: Diagnosis present

## 2024-08-07 DIAGNOSIS — R293 Abnormal posture: Secondary | ICD-10-CM | POA: Diagnosis present

## 2024-08-07 DIAGNOSIS — R252 Cramp and spasm: Secondary | ICD-10-CM | POA: Insufficient documentation

## 2024-08-07 DIAGNOSIS — R29898 Other symptoms and signs involving the musculoskeletal system: Secondary | ICD-10-CM | POA: Insufficient documentation

## 2024-08-07 NOTE — Therapy (Signed)
 OUTPATIENT PHYSICAL THERAPY THORACOLUMBAR TREATMENT    Patient Name: Sandra Day MRN: [id number] DOB:[birth year], 78 y.o., female Today's Date: 08/07/2024  END OF SESSION:  PT End of Session - 08/07/24 1506     Visit Number 3    Number of Visits 16    Date for Recertification  09/06/24    Authorization Type [employer] MCR    Progress Note Due on Visit 10    PT Start Time 1512    PT Stop Time 1559    PT Time Calculation (min) 47 min    Activity Tolerance Patient tolerated treatment well    Behavior During Therapy [facility] for tasks assessed/performed            Past Medical History:  Diagnosis Date   Anxiety    Arthritis of both knees 03/22/2017   Asthma    as a child, no issues in adulthood   Depression    Fatty liver    GERD (gastroesophageal reflux disease)    Hypertension    Multiple thyroid  nodules    Osteoporosis 03/22/2017   Pancytopenia (HCC) 03/22/2017   Past Surgical History:  Procedure Laterality Date   ANTERIOR CERVICAL DECOMP/DISCECTOMY FUSION N/A 02/21/2023   Procedure: Cervical Three-Cervical Four, Cervical Four - Cervical Five, Cervical Five-Cervical Six  Anterior Cervical Discectomy Fusion;  Surgeon: Colon Shove, MD;  Location: [facility] OR;  Service: Neurosurgery;  Laterality: N/A;  RM 21 3C   KNEE SURGERY Left    Patient Active Problem List   Diagnosis Date Noted   Cervical spondylosis with myelopathy 02/21/2023   Pancytopenia (HCC) 03/22/2017   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   Arthritis of both knees 03/22/2017   Osteoporosis 03/22/2017    PCP: Cleotilde Planas, MD   REFERRING PROVIDER: Dolphus Reiter, MD   REFERRING DIAG:  M81.0 (ICD-10-CM) - Age-related osteoporosis without current pathological fracture  Z87.81 (ICD-10-CM) - History of vertebral fracture  Z91.81 (ICD-10-CM) - History of recent fall    Rationale for Evaluation and Treatment: Rehabilitation  THERAPY DIAG:  Abnormal posture  Muscle weakness  (generalized)  Other symptoms and signs involving the musculoskeletal system  Other low back pain  ONSET DATE: Chronic  SUBJECTIVE:                                                                                                                                                                                           SUBJECTIVE STATEMENT:  Pt recently returned from [place]. She reports driving in the car for that long time. Some stiffness after that.     EVAL: States she is mainly here at physical therapy due to the referral.  Has OA everywhere and has had several issues and thinks about anything would be beneficial. States she has been like this for awhile. States she is a chronic faller and has had many falls. Most recent fall was from tripping over branch. Does not exercise and states she does not really like going to the gym.  PERTINENT HISTORY:  Anxiety, depression, arthritis in both knees, osteoporosis, pancytopenia, hypertension, L knee surgery, cervical decompression, double jointed, vertigo  PAIN:  Are you having pain? No 0/10 not really pain but I feel very stiff for some reason   PRECAUTIONS: None  WEIGHT BEARING RESTRICTIONS: No  FALLS:  Has patient fallen in last 6 months? Yes. Number of falls 1  OCCUPATION: [employer]  PLOF: Independent  PATIENT GOALS: Work without limitations, decrease pain   NEXT MD VISIT:   OBJECTIVE: (objective measures from initial evaluation unless otherwise dated)  DIAGNOSTIC FINDINGS:    PATIENT SURVEYS:  LEFS  Extreme difficulty/unable (0), Quite a bit of difficulty (1), Moderate difficulty (2), Little difficulty (3), No difficulty (4) Survey date:  EVAL  Any of your usual work, housework or school activities 4  2. Usual hobbies, recreational or sporting activities 4  3. Getting into/out of the bath 4  4. Walking between rooms 4  5. Putting on socks/shoes 4  6. Squatting  1  7. Lifting an object, like a bag of groceries from the  floor 3  8. Performing light activities around your home 4  9. Performing heavy activities around your home 3  10. Getting into/out of a car 3  11. Walking 2 blocks 3  12. Walking 1 mile 2  13. Going up/down 10 stairs (1 flight) 2  14. Standing for 1 hour 2  15.  sitting for 1 hour 4  16. Running on even ground 0  17. Running on uneven ground 0  18. Making sharp turns while running fast 0  19. Hopping  0  20. Rolling over in bed 4  Score total:  51/80    UEFS  Extreme difficulty/unable (0), Quite a bit of difficulty (1), Moderate difficulty (2), Little difficulty (3), No difficulty (4) Survey date:  EVAL  Any of your usual work, household or school activities 3  2. Your usual hobbies, recreational/sport activities 3   3. Lifting a bag of groceries to waist level 2   4. Lifting a bag of groceries above your head 4  5. Grooming your hair 3  6. Pushing up on your hands (I.e. from bathtub or chair) 3  7. Preparing food (I.e. peeling/cutting) 4  8. Driving  4  9. Vacuuming, sweeping, or raking 3  10. Dressing  4  11. Doing up buttons 3  12. Using tools/appliances 3  13. Opening doors 3  14. Cleaning  3  15. Tying or lacing shoes 4  16. Sleeping  4  17. Laundering clothes (I.e. washing, ironing, folding) 4  18. Opening a jar 1  19. Throwing a ball 2  20. Carrying a small suitcase with your affected limb.  2  Score total:  62/80     SCREENING FOR RED FLAGS: Bowel or bladder incontinence: No Spinal tumors: No Cauda equina syndrome: No Compression fracture: No Abdominal aneurysm: No  COGNITION: Overall cognitive status: Within functional limits for tasks assessed     SENSATION: WFL  POSTURE: No Significant postural limitations, rounded shoulders, forward head, increased lumbar lordosis, decreased thoracic kyphosis, and anterior pelvic tilt  PALPATION:   LUMBAR ROM:  AROM EVAL  Flexion WFL  Extension   Right lateral flexion WFL  Left lateral flexion WFL   Right rotation WFL *  Left rotation WFL   (Blank rows = not tested) * = pain/symptoms  UPPER EXTREMITY ROM:   Active ROM Right EVAL Left EVAL  Shoulder flexion [facility]  Shoulder extension    Shoulder abduction [facility]  Shoulder adduction    Shoulder internal rotation    Shoulder external rotation    Elbow flexion    Elbow extension    Wrist flexion    Wrist extension    Wrist ulnar deviation    Wrist radial deviation    Wrist pronation    Wrist supination    (Blank rows = not tested) *= pain/symptoms  UPPER EXTREMITY MMT:  MMT Right EVAL Left EVAL  Shoulder flexion 4- 4  Shoulder extension    Shoulder abduction 4- 4  Shoulder adduction    Shoulder internal rotation 4+ 4+  Shoulder external rotation 4- 4-  Middle trapezius    Lower trapezius    Elbow flexion 5 4  Elbow extension    Wrist flexion    Wrist extension    Wrist ulnar deviation    Wrist radial deviation    Wrist pronation    Wrist supination    Grip strength (lbs)    (Blank rows = not tested) *= pain/symptoms   LOWER EXTREMITY ROM:     Active  Right eval Left eval  Hip flexion    Hip extension    Hip abduction    Hip adduction    Hip internal rotation    Hip external rotation    Knee flexion    Knee extension    Ankle dorsiflexion    Ankle plantarflexion    Ankle inversion    Ankle eversion     (Blank rows = not tested) * = pain/symptoms  LOWER EXTREMITY MMT:    MMT Right eval Left eval  Hip flexion 4 4  Hip extension 4 4-  Hip abduction 4+ 4+  Hip adduction    Hip internal rotation    Hip external rotation    Knee flexion 5 4+*  Knee extension 5 5  Ankle dorsiflexion 5 5  Ankle plantarflexion    Ankle inversion    Ankle eversion     (Blank rows = not tested) * = pain/symptoms  FUNCTIONAL TESTS:  5 times sit to stand: 11.50 seconds     07/24/24 0001  Standardized Balance Assessment  Standardized Balance Assessment Dynamic Gait Index  Dynamic Gait Index  Level  Surface 3  Change in Gait Speed 3  Gait with Horizontal Head Turns 2  Gait with Vertical Head Turns 2  Gait and Pivot Turn 2  Step Over Obstacle 3  Step Around Obstacles 3  Steps 2  Total Score 20      GAIT: Distance walked: 50-100 ft from lobby to treatment room Assistive device utilized: None Level of assistance: Complete Independence Comments: Toe out gait on RLE, mild trendelenburg, lumbar lordosis   TODAY'S TREATMENT:  DATE:   08/07/24  Nustep L4x8 minutes all four extremities seat 8  HR/TR x20ea Standing marching (slow) for balance 2x10 Standing hip extension 2x10ea Partial squats 2x10 (cues)  Slight squat with lateral stepping along rail x2 laps Tandem stance x30sec ea Bridges + ABD red TB x12 Sidelying hip abduction 2x10 bil HEP update and review  07/24/24  Nustep L4x8 minutes all four extremities seat 8  DGI 20/24 Tandem stance 6x30 seconds alternating Narrow BOS blue foam EO 3x30 seconds Tandem walks at bar x2 laps, intermittent MinA   Bridges + ABD red TB x12 Sidelying clams red TB x12 B Hip hikes x12     07/12/24  Evaluation Standing hip extension x10 bilaterally  Standing marches x20 Seated clamshell x10 RTB   PATIENT EDUCATION:  Education details: Patient educated on exam findings, POC, scope of PT, HEP, pain science, relevant anatomy, and activity tolerance. Person educated: Patient Education method: Explanation, Demonstration, and Handouts Education comprehension: verbalized understanding, returned demonstration, verbal cues required, and tactile cues required  HOME EXERCISE PROGRAM: Access Code: [email or web address] URL: [email or web address] Date: 07/12/2024 Prepared by: Lili Finder  Exercises - Standing March with Counter Support  - 1 x daily - 7 x weekly - 3 sets - 10 reps - Standing Hip Extension  with Unilateral Counter Support  - 1 x daily - 7 x weekly - 3 sets - 10 reps - Seated Hip Abduction  - 1 x daily - 7 x weekly - 3 sets - 10 reps  ASSESSMENT:  CLINICAL IMPRESSION:  Pt mentioned she may have inner ear issue as she becomes dizzy when switching from side to side on plinth. Will monitor this for changes. Pt had good tolerance for LE strengthening and balance program today. Cues required for squat technique for proper hip hinge. Pt fatigues quickly with strengthening tasks. Following balance tasks, pt reported feeling slightly dizzy and nauseated. Discontinued standing exercise and had pt sit and rest. Pt reported not eating or drinking much today. PT states she could feel her ears being clogged. Advised she talk to PCP after she disclosed this is a frequent occurrence. Will continue to monitor this and progress as tolerated.     EVAL: Patient a 78 y.o. y.o. female who was seen today for physical therapy evaluation and treatment for general weakness and widespread pain. Patient presents with pain limited deficits in lower and upper extremity strength, ROM, endurance, activity tolerance, and functional mobility with ADL. Patient is having to modify and restrict ADL as indicated by outcome measure score as well as subjective information and objective measures which is affecting overall participation. Patient will benefit from skilled physical therapy in order to improve function and reduce impairment.  OBJECTIVE IMPAIRMENTS: Abnormal gait, decreased activity tolerance, decreased balance, decreased endurance, decreased mobility, difficulty walking, decreased ROM, decreased strength, hypomobility, increased muscle spasms, impaired flexibility, improper body mechanics, postural dysfunction, and pain  ACTIVITY LIMITATIONS: carrying, lifting, bending, standing, squatting, stairs, transfers, bed mobility, reach over head, locomotion level, and caring for others  PARTICIPATION LIMITATIONS:   meal prep, cleaning, laundry, shopping, community activity, occupation, and yard work  PERSONAL FACTORS: Age, Time since onset of injury/illness/exacerbation, and 3+ comorbidities: Anxiety, depression, arthritis in both knees, osteoporosis, pancytopenia, hypertension, vertigo also affecting patient's functional outcome.   REHAB POTENTIAL: Good  CLINICAL DECISION MAKING: Evolving/moderate complexity  EVALUATION COMPLEXITY: Moderate   GOALS: Goals reviewed with patient? Yes  SHORT TERM GOALS: Target date: 07/26/2024  Patient will be independent with HEP in order to improve  functional outcomes. Baseline:  Goal status: INITIAL  2.  Patient will report at least 25% improvement in symptoms for improved quality of life. Baseline:  Goal status: INITIAL  LONG TERM GOALS: Target date: 09/06/2024  Patient will report at least 75% improvement in symptoms for improved quality of life. Baseline:  Goal status: INITIAL  2.  Patient will improve modified oswestry score by at least 10 points in order to indicate improved tolerance to activity. Baseline:  Goal status: INITIAL  3.  Patient will complete 5xSTS in </ 10 seconds to indicate improved fall risk. Baseline:  Goal status: INITIAL  4.  Patient will demonstrate grade of 5/5 MMT grade in all tested musculature as evidence of improved strength to assist with stair ambulation and gait.   Baseline:  Goal status: INITIAL  PLAN:  PT FREQUENCY: 1-2x/week  PT DURATION: 8 weeks  PLANNED INTERVENTIONS: 97164- PT Re-evaluation, 97110-Therapeutic exercises, 97530- Therapeutic activity, [id number]- Neuromuscular re-education, 97535- Self Care, [id number]- Manual therapy, [id number]- Gait training, [phone]- Orthotic Fit/training, [phone]- Canalith repositioning, [id number]- Aquatic Therapy, [phone]- Splinting, [phone]- Wound care (first 20 sq cm), 97598- Wound care (each additional 20 sq cm)Patient/Family education, Balance training, Stair training, Taping, Dry Needling,  Joint mobilization, Joint manipulation, Spinal manipulation, Spinal mobilization, Scar mobilization, and DME instructions.  PLAN FOR NEXT SESSION: plan to progress as tolerate, continue quad and glute strengthening. Consider adding ankle strength HEP to help balance   Asberry Rodes, PTA  08/07/24 4:57 PM

## 2024-08-09 ENCOUNTER — Ambulatory Visit (HOSPITAL_BASED_OUTPATIENT_CLINIC_OR_DEPARTMENT_OTHER)

## 2024-08-09 ENCOUNTER — Encounter (HOSPITAL_BASED_OUTPATIENT_CLINIC_OR_DEPARTMENT_OTHER): Payer: Self-pay

## 2024-08-09 DIAGNOSIS — R29898 Other symptoms and signs involving the musculoskeletal system: Secondary | ICD-10-CM

## 2024-08-09 DIAGNOSIS — R293 Abnormal posture: Secondary | ICD-10-CM

## 2024-08-09 DIAGNOSIS — M5459 Other low back pain: Secondary | ICD-10-CM

## 2024-08-09 DIAGNOSIS — M6281 Muscle weakness (generalized): Secondary | ICD-10-CM

## 2024-08-09 NOTE — Therapy (Signed)
 OUTPATIENT PHYSICAL THERAPY THORACOLUMBAR TREATMENT    Patient Name: Sandra Day MRN: [id number] DOB:[birth year], 78 y.o., female Today's Date: 08/09/2024  END OF SESSION:  PT End of Session - 08/09/24 1427     Visit Number 4    Number of Visits 16    Date for Recertification  09/06/24    Authorization Type BC[employer] MCR    Progress Note Due on Visit 10    PT Start Time 1433    PT Stop Time 1515    PT Time Calculation (min) 42 min    Activity Tolerance Patient tolerated treatment well    Behavior During Therapy [facility] for tasks assessed/performed             Past Medical History:  Diagnosis Date   Anxiety    Arthritis of both knees 03/22/2017   Asthma    as a child, no issues in adulthood   Depression    Fatty liver    GERD (gastroesophageal reflux disease)    Hypertension    Multiple thyroid  nodules    Osteoporosis 03/22/2017   Pancytopenia (HCC) 03/22/2017   Past Surgical History:  Procedure Laterality Date   ANTERIOR CERVICAL DECOMP/DISCECTOMY FUSION N/A 02/21/2023   Procedure: Cervical Three-Cervical Four, Cervical Four - Cervical Five, Cervical Five-Cervical Six  Anterior Cervical Discectomy Fusion;  Surgeon: Colon Shove, MD;  Location: [facility] OR;  Service: Neurosurgery;  Laterality: N/A;  RM 21 3C   KNEE SURGERY Left    Patient Active Problem List   Diagnosis Date Noted   Cervical spondylosis with myelopathy 02/21/2023   Pancytopenia (HCC) 03/22/2017   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   Arthritis of both knees 03/22/2017   Osteoporosis 03/22/2017    PCP: Cleotilde Planas, MD   REFERRING PROVIDER: Dolphus Reiter, MD   REFERRING DIAG:  M81.0 (ICD-10-CM) - Age-related osteoporosis without current pathological fracture  Z87.81 (ICD-10-CM) - History of vertebral fracture  Z91.81 (ICD-10-CM) - History of recent fall    Rationale for Evaluation and Treatment: Rehabilitation  THERAPY DIAG:  Abnormal posture  Muscle weakness  (generalized)  Other symptoms and signs involving the musculoskeletal system  Other low back pain  ONSET DATE: Chronic  SUBJECTIVE:                                                                                                                                                                                           SUBJECTIVE STATEMENT:  Pt recently returned from [place]. She reports driving in the car for that long time. Some stiffness after that.     EVAL: States she is mainly here at physical therapy due to the  referral. Has OA everywhere and has had several issues and thinks about anything would be beneficial. States she has been like this for awhile. States she is a chronic faller and has had many falls. Most recent fall was from tripping over branch. Does not exercise and states she does not really like going to the gym.  PERTINENT HISTORY:  Anxiety, depression, arthritis in both knees, osteoporosis, pancytopenia, hypertension, L knee surgery, cervical decompression, double jointed, vertigo  PAIN:  Are you having pain? No 0/10 not really pain but I feel very stiff for some reason   PRECAUTIONS: None  WEIGHT BEARING RESTRICTIONS: No  FALLS:  Has patient fallen in last 6 months? Yes. Number of falls 1  OCCUPATION: Dog [employer]  PLOF: Independent  PATIENT GOALS: Work without limitations, decrease pain   NEXT MD VISIT:   OBJECTIVE: (objective measures from initial evaluation unless otherwise dated)  DIAGNOSTIC FINDINGS:    PATIENT SURVEYS:  LEFS  Extreme difficulty/unable (0), Quite a bit of difficulty (1), Moderate difficulty (2), Little difficulty (3), No difficulty (4) Survey date:  EVAL  Any of your usual work, housework or school activities 4  2. Usual hobbies, recreational or sporting activities 4  3. Getting into/out of the bath 4  4. Walking between rooms 4  5. Putting on socks/shoes 4  6. Squatting  1  7. Lifting an object, like a bag of groceries from the  floor 3  8. Performing light activities around your home 4  9. Performing heavy activities around your home 3  10. Getting into/out of a car 3  11. Walking 2 blocks 3  12. Walking 1 mile 2  13. Going up/down 10 stairs (1 flight) 2  14. Standing for 1 hour 2  15.  sitting for 1 hour 4  16. Running on even ground 0  17. Running on uneven ground 0  18. Making sharp turns while running fast 0  19. Hopping  0  20. Rolling over in bed 4  Score total:  51/80    UEFS  Extreme difficulty/unable (0), Quite a bit of difficulty (1), Moderate difficulty (2), Little difficulty (3), No difficulty (4) Survey date:  EVAL  Any of your usual work, household or school activities 3  2. Your usual hobbies, recreational/sport activities 3   3. Lifting a bag of groceries to waist level 2   4. Lifting a bag of groceries above your head 4  5. Grooming your hair 3  6. Pushing up on your hands (I.e. from bathtub or chair) 3  7. Preparing food (I.e. peeling/cutting) 4  8. Driving  4  9. Vacuuming, sweeping, or raking 3  10. Dressing  4  11. Doing up buttons 3  12. Using tools/appliances 3  13. Opening doors 3  14. Cleaning  3  15. Tying or lacing shoes 4  16. Sleeping  4  17. Laundering clothes (I.e. washing, ironing, folding) 4  18. Opening a jar 1  19. Throwing a ball 2  20. Carrying a small suitcase with your affected limb.  2  Score total:  62/80     SCREENING FOR RED FLAGS: Bowel or bladder incontinence: No Spinal tumors: No Cauda equina syndrome: No Compression fracture: No Abdominal aneurysm: No  COGNITION: Overall cognitive status: Within functional limits for tasks assessed     SENSATION: WFL  POSTURE: No Significant postural limitations, rounded shoulders, forward head, increased lumbar lordosis, decreased thoracic kyphosis, and anterior pelvic tilt  PALPATION:   LUMBAR  ROM:   AROM EVAL  Flexion WFL  Extension   Right lateral flexion WFL  Left lateral flexion WFL   Right rotation WFL *  Left rotation WFL   (Blank rows = not tested) * = pain/symptoms  UPPER EXTREMITY ROM:   Active ROM Right EVAL Left EVAL  Shoulder flexion [facility]  Shoulder extension    Shoulder abduction [facility]  Shoulder adduction    Shoulder internal rotation    Shoulder external rotation    Elbow flexion    Elbow extension    Wrist flexion    Wrist extension    Wrist ulnar deviation    Wrist radial deviation    Wrist pronation    Wrist supination    (Blank rows = not tested) *= pain/symptoms  UPPER EXTREMITY MMT:  MMT Right EVAL Left EVAL  Shoulder flexion 4- 4  Shoulder extension    Shoulder abduction 4- 4  Shoulder adduction    Shoulder internal rotation 4+ 4+  Shoulder external rotation 4- 4-  Middle trapezius    Lower trapezius    Elbow flexion 5 4  Elbow extension    Wrist flexion    Wrist extension    Wrist ulnar deviation    Wrist radial deviation    Wrist pronation    Wrist supination    Grip strength (lbs)    (Blank rows = not tested) *= pain/symptoms   LOWER EXTREMITY ROM:     Active  Right eval Left eval  Hip flexion    Hip extension    Hip abduction    Hip adduction    Hip internal rotation    Hip external rotation    Knee flexion    Knee extension    Ankle dorsiflexion    Ankle plantarflexion    Ankle inversion    Ankle eversion     (Blank rows = not tested) * = pain/symptoms  LOWER EXTREMITY MMT:    MMT Right eval Left eval  Hip flexion 4 4  Hip extension 4 4-  Hip abduction 4+ 4+  Hip adduction    Hip internal rotation    Hip external rotation    Knee flexion 5 4+*  Knee extension 5 5  Ankle dorsiflexion 5 5  Ankle plantarflexion    Ankle inversion    Ankle eversion     (Blank rows = not tested) * = pain/symptoms  FUNCTIONAL TESTS:  5 times sit to stand: 11.50 seconds     07/24/24 0001  Standardized Balance Assessment  Standardized Balance Assessment Dynamic Gait Index  Dynamic Gait Index  Level  Surface 3  Change in Gait Speed 3  Gait with Horizontal Head Turns 2  Gait with Vertical Head Turns 2  Gait and Pivot Turn 2  Step Over Obstacle 3  Step Around Obstacles 3  Steps 2  Total Score 20      GAIT: Distance walked: 50-100 ft from lobby to treatment room Assistive device utilized: None Level of assistance: Complete Independence Comments: Toe out gait on RLE, mild trendelenburg, lumbar lordosis   TODAY'S TREATMENT:  DATE:   08/09/24  Nustep L4x6 minutes all four extremities seat 8 HR/TR x20ea Step taps to 6 step no UE support 2x12ea- SBA Hurdle step overs (reciprocal) fwd x4 hurdles x4 laps, lateral x3 laps Standing hip extension 2x15ea Partial squats 2x10 (cues)  Slight squat with lateral stepping along rail x4 laps Tandem stance 2x15sec ea Lunges at rail 2x5ea Modified tandem on airex x30sec ea Romberg on airex 2x30sec LAQ 3# 2x10 3 hold  08/07/24  Nustep L4x8 minutes all four extremities seat 8  HR/TR x20ea Standing marching (slow) for balance 2x10 Standing hip extension 2x10ea Partial squats 2x10 (cues)  Slight squat with lateral stepping along rail x2 laps Tandem stance x30sec ea Bridges + ABD red TB x12 Sidelying hip abduction 2x10 bil HEP update and review  07/24/24  Nustep L4x8 minutes all four extremities seat 8  DGI 20/24 Tandem stance 6x30 seconds alternating Narrow BOS blue foam EO 3x30 seconds Tandem walks at bar x2 laps, intermittent MinA   Bridges + ABD red TB x12 Sidelying clams red TB x12 B Hip hikes x12     07/12/24  Evaluation Standing hip extension x10 bilaterally  Standing marches x20 Seated clamshell x10 RTB   PATIENT EDUCATION:  Education details: Patient educated on exam findings, POC, scope of PT, HEP, pain science, relevant anatomy, and activity tolerance. Person educated:  Patient Education method: Explanation, Demonstration, and Handouts Education comprehension: verbalized understanding, returned demonstration, verbal cues required, and tactile cues required  HOME EXERCISE PROGRAM: Access Code: [email or web address] URL: [email or web address] Date: 07/12/2024 Prepared by: Lili Finder  Exercises - Standing March with Counter Support  - 1 x daily - 7 x weekly - 3 sets - 10 reps - Standing Hip Extension with Unilateral Counter Support  - 1 x daily - 7 x weekly - 3 sets - 10 reps - Seated Hip Abduction  - 1 x daily - 7 x weekly - 3 sets - 10 reps  ASSESSMENT:  CLINICAL IMPRESSION:  No c/o dizziness or nausea today during session. Progressed balance tasks to include standing step taps to 6 step without UE support as well as hurdle step overs. Pt initially apprehensive/mildly unsteady but this did improve as pt completed the exercises. SBA provided as a precaution. No CGA required with balance tasks. Pt with significantly greater weakness observed in L LE as evident with lateral hurdle step overs. HS tightness noted with LAQ. Will continue to progress as tolerated.     EVAL: Patient a 78 y.o. y.o. female who was seen today for physical therapy evaluation and treatment for general weakness and widespread pain. Patient presents with pain limited deficits in lower and upper extremity strength, ROM, endurance, activity tolerance, and functional mobility with ADL. Patient is having to modify and restrict ADL as indicated by outcome measure score as well as subjective information and objective measures which is affecting overall participation. Patient will benefit from skilled physical therapy in order to improve function and reduce impairment.  OBJECTIVE IMPAIRMENTS: Abnormal gait, decreased activity tolerance, decreased balance, decreased endurance, decreased mobility, difficulty walking, decreased ROM, decreased strength, hypomobility, increased muscle spasms,  impaired flexibility, improper body mechanics, postural dysfunction, and pain  ACTIVITY LIMITATIONS: carrying, lifting, bending, standing, squatting, stairs, transfers, bed mobility, reach over head, locomotion level, and caring for others  PARTICIPATION LIMITATIONS:  meal prep, cleaning, laundry, shopping, community activity, occupation, and yard work  PERSONAL FACTORS: Age, Time since onset of injury/illness/exacerbation, and 3+ comorbidities: Anxiety, depression, arthritis in both knees, osteoporosis, pancytopenia, hypertension, vertigo  also affecting patient's functional outcome.   REHAB POTENTIAL: Good  CLINICAL DECISION MAKING: Evolving/moderate complexity  EVALUATION COMPLEXITY: Moderate   GOALS: Goals reviewed with patient? Yes  SHORT TERM GOALS: Target date: 07/26/2024  Patient will be independent with HEP in order to improve functional outcomes. Baseline:  Goal status: INITIAL  2.  Patient will report at least 25% improvement in symptoms for improved quality of life. Baseline:  Goal status: INITIAL  LONG TERM GOALS: Target date: 09/06/2024  Patient will report at least 75% improvement in symptoms for improved quality of life. Baseline:  Goal status: INITIAL  2.  Patient will improve modified oswestry score by at least 10 points in order to indicate improved tolerance to activity. Baseline:  Goal status: INITIAL  3.  Patient will complete 5xSTS in </ 10 seconds to indicate improved fall risk. Baseline:  Goal status: INITIAL  4.  Patient will demonstrate grade of 5/5 MMT grade in all tested musculature as evidence of improved strength to assist with stair ambulation and gait.   Baseline:  Goal status: INITIAL  PLAN:  PT FREQUENCY: 1-2x/week  PT DURATION: 8 weeks  PLANNED INTERVENTIONS: 97164- PT Re-evaluation, 97110-Therapeutic exercises, 97530- Therapeutic activity, [id number]- Neuromuscular re-education, 97535- Self Care, [id number]- Manual therapy, [id number]- Gait  training, [phone]- Orthotic Fit/training, [phone]- Canalith repositioning, [id number]- Aquatic Therapy, [phone]- Splinting, [phone]- Wound care (first 20 sq cm), 97598- Wound care (each additional 20 sq cm)Patient/Family education, Balance training, Stair training, Taping, Dry Needling, Joint mobilization, Joint manipulation, Spinal manipulation, Spinal mobilization, Scar mobilization, and DME instructions.  PLAN FOR NEXT SESSION: plan to progress as tolerate, continue quad and glute strengthening. Consider adding ankle strength HEP to help balance   Asberry Rodes, PTA  08/09/2024 3:32 PM

## 2024-08-10 ENCOUNTER — Encounter (HOSPITAL_COMMUNITY)
Admission: RE | Admit: 2024-08-10 | Discharge: 2024-08-10 | Disposition: A | Source: Ambulatory Visit | Attending: Rheumatology

## 2024-08-10 VITALS — BP 148/75 | HR 93 | Temp 97.4°F | Resp 17

## 2024-08-10 DIAGNOSIS — M810 Age-related osteoporosis without current pathological fracture: Secondary | ICD-10-CM

## 2024-08-10 MED ORDER — ROMOSOZUMAB-AQQG 105 MG/1.17ML ~~LOC~~ SOSY
PREFILLED_SYRINGE | SUBCUTANEOUS | Status: AC
  Filled 2024-08-10: qty 1.17

## 2024-08-10 MED ORDER — ROMOSOZUMAB-AQQG 105 MG/1.17ML ~~LOC~~ SOSY
210.0000 mg | PREFILLED_SYRINGE | Freq: Once | SUBCUTANEOUS | Status: AC
  Administered 2024-08-10: 210 mg via SUBCUTANEOUS

## 2024-08-13 ENCOUNTER — Ambulatory Visit (HOSPITAL_BASED_OUTPATIENT_CLINIC_OR_DEPARTMENT_OTHER)

## 2024-08-13 ENCOUNTER — Encounter (HOSPITAL_BASED_OUTPATIENT_CLINIC_OR_DEPARTMENT_OTHER): Payer: Self-pay

## 2024-08-15 NOTE — Addendum Note (Signed)
 Addended by: DAYNE SHERRY RAMAN on: 08/15/2024 08:42 AM   Modules accepted: Orders

## 2024-08-16 ENCOUNTER — Encounter (HOSPITAL_BASED_OUTPATIENT_CLINIC_OR_DEPARTMENT_OTHER): Admitting: Physical Therapy

## 2024-08-21 ENCOUNTER — Encounter (HOSPITAL_COMMUNITY): Payer: Self-pay | Admitting: Rheumatology

## 2024-08-21 ENCOUNTER — Encounter (HOSPITAL_BASED_OUTPATIENT_CLINIC_OR_DEPARTMENT_OTHER): Admitting: Physical Therapy

## 2024-08-21 ENCOUNTER — Encounter (HOSPITAL_BASED_OUTPATIENT_CLINIC_OR_DEPARTMENT_OTHER): Payer: Self-pay | Admitting: Physical Therapy

## 2024-08-21 DIAGNOSIS — R29898 Other symptoms and signs involving the musculoskeletal system: Secondary | ICD-10-CM

## 2024-08-21 DIAGNOSIS — R293 Abnormal posture: Secondary | ICD-10-CM

## 2024-08-21 DIAGNOSIS — R252 Cramp and spasm: Secondary | ICD-10-CM

## 2024-08-21 DIAGNOSIS — M6281 Muscle weakness (generalized): Secondary | ICD-10-CM

## 2024-08-21 DIAGNOSIS — M5459 Other low back pain: Secondary | ICD-10-CM

## 2024-08-21 NOTE — Addendum Note (Signed)
 Addended by: DAYNE SHERRY RAMAN on: 08/21/2024 11:17 AM   Modules accepted: Orders

## 2024-08-21 NOTE — Therapy (Signed)
 " OUTPATIENT PHYSICAL THERAPY THORACOLUMBAR TREATMENT/RECERT   Patient Name: Sandra Day MRN: [id number] DOB:05-18-1946, 78 y.o., female Today's Date: 08/21/2024  END OF SESSION:  PT End of Session - 08/21/24 1524     Visit Number 5    Number of Visits 16    Date for Recertification  10/16/24    Authorization Type [employer] MCR    Progress Note Due on Visit 10    PT Start Time 1511    PT Stop Time 1550    PT Time Calculation (min) 39 min    Activity Tolerance Patient tolerated treatment well    Behavior During Therapy [facility] for tasks assessed/performed              Past Medical History:  Diagnosis Date   Anxiety    Arthritis of both knees 03/22/2017   Asthma    as a child, no issues in adulthood   Depression    Fatty liver    GERD (gastroesophageal reflux disease)    Hypertension    Multiple thyroid  nodules    Osteoporosis 03/22/2017   Pancytopenia (HCC) 03/22/2017   Past Surgical History:  Procedure Laterality Date   ANTERIOR CERVICAL DECOMP/DISCECTOMY FUSION N/A 02/21/2023   Procedure: Cervical Three-Cervical Four, Cervical Four - Cervical Five, Cervical Five-Cervical Six  Anterior Cervical Discectomy Fusion;  Surgeon: Colon Shove, MD;  Location: [facility] OR;  Service: Neurosurgery;  Laterality: N/A;  RM 21 3C   KNEE SURGERY Left    Patient Active Problem List   Diagnosis Date Noted   Cervical spondylosis with myelopathy 02/21/2023   Pancytopenia (HCC) 03/22/2017   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   Arthritis of both knees 03/22/2017   Osteoporosis 03/22/2017    PCP: Cleotilde Planas, MD   REFERRING PROVIDER: Dolphus Reiter, MD   REFERRING DIAG:  M81.0 (ICD-10-CM) - Age-related osteoporosis without current pathological fracture  Z87.81 (ICD-10-CM) - History of vertebral fracture  Z91.81 (ICD-10-CM) - History of recent fall    Rationale for Evaluation and Treatment: Rehabilitation  THERAPY DIAG:  Abnormal posture  Muscle weakness  (generalized)  Other symptoms and signs involving the musculoskeletal system  Other low back pain  Cramp and spasm  ONSET DATE: Chronic  SUBJECTIVE:                                                                                                                                                                                           SUBJECTIVE STATEMENT:  Things are feeling OK, feel like PT has been helpful. I've had a lot of unexpected things come up so I had to cancel a lot but I'd really like  to continue. More aware of balance issues.     EVAL: States she is mainly here at physical therapy due to the referral. Has OA everywhere and has had several issues and thinks about anything would be beneficial. States she has been like this for awhile. States she is a chronic faller and has had many falls. Most recent fall was from tripping over branch. Does not exercise and states she does not really like going to the gym.  PERTINENT HISTORY:  Anxiety, depression, arthritis in both knees, osteoporosis, pancytopenia, hypertension, L knee surgery, cervical decompression, double jointed, vertigo  PAIN:  Are you having pain? No 0/10 not really pain but I feel very stiff for some reason, some tingling as well   PRECAUTIONS: None  WEIGHT BEARING RESTRICTIONS: No  FALLS:  Has patient fallen in last 6 months? Yes. Number of falls 1  OCCUPATION: [employer]  PLOF: Independent  PATIENT GOALS: Work without limitations, decrease pain   NEXT MD VISIT:   OBJECTIVE: (objective measures from initial evaluation unless otherwise dated)  DIAGNOSTIC FINDINGS:    PATIENT SURVEYS:  LEFS  Extreme difficulty/unable (0), Quite a bit of difficulty (1), Moderate difficulty (2), Little difficulty (3), No difficulty (4) Survey date:  EVAL  Any of your usual work, housework or school activities 4  2. Usual hobbies, recreational or sporting activities 4  3. Getting into/out of the bath 4  4. Walking  between rooms 4  5. Putting on socks/shoes 4  6. Squatting  1  7. Lifting an object, like a bag of groceries from the floor 3  8. Performing light activities around your home 4  9. Performing heavy activities around your home 3  10. Getting into/out of a car 3  11. Walking 2 blocks 3  12. Walking 1 mile 2  13. Going up/down 10 stairs (1 flight) 2  14. Standing for 1 hour 2  15.  sitting for 1 hour 4  16. Running on even ground 0  17. Running on uneven ground 0  18. Making sharp turns while running fast 0  19. Hopping  0  20. Rolling over in bed 4  Score total:  51/80    UEFS  Extreme difficulty/unable (0), Quite a bit of difficulty (1), Moderate difficulty (2), Little difficulty (3), No difficulty (4) Survey date:  EVAL  Any of your usual work, household or school activities 3  2. Your usual hobbies, recreational/sport activities 3   3. Lifting a bag of groceries to waist level 2   4. Lifting a bag of groceries above your head 4  5. Grooming your hair 3  6. Pushing up on your hands (I.e. from bathtub or chair) 3  7. Preparing food (I.e. peeling/cutting) 4  8. Driving  4  9. Vacuuming, sweeping, or raking 3  10. Dressing  4  11. Doing up buttons 3  12. Using tools/appliances 3  13. Opening doors 3  14. Cleaning  3  15. Tying or lacing shoes 4  16. Sleeping  4  17. Laundering clothes (I.e. washing, ironing, folding) 4  18. Opening a jar 1  19. Throwing a ball 2  20. Carrying a small suitcase with your affected limb.  2  Score total:  62/80     SCREENING FOR RED FLAGS: Bowel or bladder incontinence: No Spinal tumors: No Cauda equina syndrome: No Compression fracture: No Abdominal aneurysm: No  COGNITION: Overall cognitive status: Within functional limits for tasks assessed  SENSATION: WFL  POSTURE: No Significant postural limitations, rounded shoulders, forward head, increased lumbar lordosis, decreased thoracic kyphosis, and anterior pelvic  tilt  PALPATION:   LUMBAR ROM:   AROM EVAL  Flexion WFL  Extension   Right lateral flexion WFL  Left lateral flexion WFL  Right rotation WFL *  Left rotation WFL   (Blank rows = not tested) * = pain/symptoms  UPPER EXTREMITY ROM:   Active ROM Right EVAL Left EVAL  Shoulder flexion [facility]  Shoulder extension    Shoulder abduction [facility]  Shoulder adduction    Shoulder internal rotation    Shoulder external rotation    Elbow flexion    Elbow extension    Wrist flexion    Wrist extension    Wrist ulnar deviation    Wrist radial deviation    Wrist pronation    Wrist supination    (Blank rows = not tested) *= pain/symptoms  UPPER EXTREMITY MMT:  MMT Right EVAL Left EVAL 08/21/24 R 08/21/24 L  Shoulder flexion 4- 4 4+ 4+  Shoulder extension      Shoulder abduction 4- 4 4+ 3  Shoulder adduction      Shoulder internal rotation 4+ 4+ 4+ 4+  Shoulder external rotation 4- 4- 4 4  Middle trapezius      Lower trapezius      Elbow flexion 5 4 4 4   Elbow extension      Wrist flexion      Wrist extension      Wrist ulnar deviation      Wrist radial deviation      Wrist pronation      Wrist supination      Grip strength (lbs)      (Blank rows = not tested) *= pain/symptoms   LOWER EXTREMITY ROM:     Active  Right eval Left eval  Hip flexion    Hip extension    Hip abduction    Hip adduction    Hip internal rotation    Hip external rotation    Knee flexion    Knee extension    Ankle dorsiflexion    Ankle plantarflexion    Ankle inversion    Ankle eversion     (Blank rows = not tested) * = pain/symptoms  LOWER EXTREMITY MMT:    MMT Right eval Left eval Right 08/21/24 Left 08/21/24  Hip flexion 4 4 4+ 4+  Hip extension 4 4-    Hip abduction 4+ 4+ 4+ 4-  Hip adduction      Hip internal rotation      Hip external rotation      Knee flexion 5 4+* 4+ 4+  Knee extension 5 5 5 5   Ankle dorsiflexion 5 5    Ankle plantarflexion      Ankle  inversion      Ankle eversion       (Blank rows = not tested) * = pain/symptoms  FUNCTIONAL TESTS:  5 times sit to stand: 11.50 seconds     07/24/24 0001  Standardized Balance Assessment  Standardized Balance Assessment Dynamic Gait Index  Dynamic Gait Index  Level Surface 3  Change in Gait Speed 3  Gait with Horizontal Head Turns 2  Gait with Vertical Head Turns 2  Gait and Pivot Turn 2  Step Over Obstacle 3  Step Around Obstacles 3  Steps 2  Total Score 20      GAIT: Distance walked: 50-100 ft from lobby to  treatment room Assistive device utilized: None Level of assistance: Complete Independence Comments: Toe out gait on RLE, mild trendelenburg, lumbar lordosis   TODAY'S TREATMENT:                                                                                                                              DATE:   08/21/24  Nustep L5x8 minutes all four extremities, seat 8 MMT, goals Shuttle BLE press 62# x10 Shuttle single leg press 25# x10 B  Standing hip ABD green TB 2x12 B Standing hip flexion green TB 2x12 B  Tandem blue foam pad 4x30 seconds alternating  Tandem walks forward/backward 3 laps next to high table  Alternating cone taps x20  Alternating cross midline cone taps x20     PATIENT EDUCATION:  Education details: Patient educated on exam findings, POC, scope of PT, HEP, pain science, relevant anatomy, and activity tolerance. Person educated: Patient Education method: Explanation, Demonstration, and Handouts Education comprehension: verbalized understanding, returned demonstration, verbal cues required, and tactile cues required  HOME EXERCISE PROGRAM: Access Code: [email or web address] URL: [email or web address] Date: 07/12/2024 Prepared by: Lili Finder  Exercises - Standing March with Counter Support  - 1 x daily - 7 x weekly - 3 sets - 10 reps - Standing Hip Extension with Unilateral Counter Support  - 1 x daily - 7 x weekly - 3 sets -  10 reps - Seated Hip Abduction  - 1 x daily - 7 x weekly - 3 sets - 10 reps  ASSESSMENT:  CLINICAL IMPRESSION:  Arrived today doing well, unfortunately she had some personal events happen that required canceling PT recently, but she feels that therapy has been helpful and would like to continue. She does show some improvements in functional strength as noted in tables above. Anticipate that she will benefit from continuation of skilled PT care to continue to work towards goals yet unmet.     EVAL: Patient a 78 y.o. y.o. female who was seen today for physical therapy evaluation and treatment for general weakness and widespread pain. Patient presents with pain limited deficits in lower and upper extremity strength, ROM, endurance, activity tolerance, and functional mobility with ADL. Patient is having to modify and restrict ADL as indicated by outcome measure score as well as subjective information and objective measures which is affecting overall participation. Patient will benefit from skilled physical therapy in order to improve function and reduce impairment.  OBJECTIVE IMPAIRMENTS: Abnormal gait, decreased activity tolerance, decreased balance, decreased endurance, decreased mobility, difficulty walking, decreased ROM, decreased strength, hypomobility, increased muscle spasms, impaired flexibility, improper body mechanics, postural dysfunction, and pain  ACTIVITY LIMITATIONS: carrying, lifting, bending, standing, squatting, stairs, transfers, bed mobility, reach over head, locomotion level, and caring for others  PARTICIPATION LIMITATIONS:  meal prep, cleaning, laundry, shopping, community activity, occupation, and yard work  PERSONAL FACTORS: Age, Time since onset of injury/illness/exacerbation, and 3+ comorbidities: Anxiety, depression, arthritis in both knees, osteoporosis, pancytopenia, hypertension, vertigo also  affecting patient's functional outcome.   REHAB POTENTIAL: Good  CLINICAL  DECISION MAKING: Evolving/moderate complexity  EVALUATION COMPLEXITY: Moderate   GOALS: Goals reviewed with patient? Yes  SHORT TERM GOALS: Target date: 09/18/2024    Patient will be independent with HEP in order to improve functional outcomes. Baseline:  Goal status: IN PROGRESS 08/21/24   2.  Patient will report at least 25% improvement in symptoms for improved quality of life. Baseline:  Goal status: IN PROGRESS 08/21/24 per pt, 20% better  LONG TERM GOALS: Target date: 10/16/2024    Patient will report at least 75% improvement in symptoms for improved quality of life. Baseline:  Goal status: IN PROGRESS 08/21/24  2.  Patient will improve modified oswestry score by at least 10 points in order to indicate improved tolerance to activity. Baseline:  Goal status: IN PROGRESS 08/21/24  3.  Patient will complete 5xSTS in </ 10 seconds to indicate improved fall risk. Baseline:  Goal status: IN PROGRESS 08/21/24  4.  Patient will demonstrate grade of 5/5 MMT grade in all tested musculature as evidence of improved strength to assist with stair ambulation and gait.   Baseline:  Goal status: IN PROGRESS 08/21/24 see table   PLAN:  PT FREQUENCY: 1-2x/week  PT DURATION: 8 weeks  PLANNED INTERVENTIONS: 97164- PT Re-evaluation, 97110-Therapeutic exercises, 97530- Therapeutic activity, 97112- Neuromuscular re-education, 97535- Self Care, [id number]- Manual therapy, [phone]- Gait training, [phone]- Orthotic Fit/training, [phone]- Canalith repositioning, [id number]- Aquatic Therapy, [phone]- Splinting, [phone]- Wound care (first 20 sq cm), 97598- Wound care (each additional 20 sq cm)Patient/Family education, Balance training, Stair training, Taping, Dry Needling, Joint mobilization, Joint manipulation, Spinal manipulation, Spinal mobilization, Scar mobilization, and DME instructions.  PLAN FOR NEXT SESSION: plan to progress as tolerate, continue quad and glute strengthening. Might benefit from ankle  strengthening HEP to help balance   Josette Rough, PT, DPT 08/21/2024 3:53 PM      "

## 2024-08-28 ENCOUNTER — Encounter (HOSPITAL_BASED_OUTPATIENT_CLINIC_OR_DEPARTMENT_OTHER)

## 2024-08-31 ENCOUNTER — Ambulatory Visit (HOSPITAL_BASED_OUTPATIENT_CLINIC_OR_DEPARTMENT_OTHER): Attending: Rheumatology | Admitting: Physical Therapy

## 2024-08-31 ENCOUNTER — Encounter (HOSPITAL_BASED_OUTPATIENT_CLINIC_OR_DEPARTMENT_OTHER): Payer: Self-pay | Admitting: Physical Therapy

## 2024-08-31 DIAGNOSIS — R293 Abnormal posture: Secondary | ICD-10-CM | POA: Insufficient documentation

## 2024-08-31 DIAGNOSIS — M5459 Other low back pain: Secondary | ICD-10-CM | POA: Insufficient documentation

## 2024-08-31 DIAGNOSIS — M6281 Muscle weakness (generalized): Secondary | ICD-10-CM | POA: Diagnosis present

## 2024-08-31 DIAGNOSIS — R29898 Other symptoms and signs involving the musculoskeletal system: Secondary | ICD-10-CM | POA: Insufficient documentation

## 2024-08-31 NOTE — Therapy (Signed)
 " OUTPATIENT PHYSICAL THERAPY THORACOLUMBAR TREATMENT/RECERT   Patient Name: Shakeeta Godette MRN: [id number] DOB:1946-02-20, 79 y.o., female Today's Date: 08/31/2024  END OF SESSION:  PT End of Session - 08/31/24 1431     Visit Number 6    Number of Visits 16    Date for Recertification  10/16/24    Authorization Type [employer] MCR    Progress Note Due on Visit 10    PT Start Time 1431    PT Stop Time 1510    PT Time Calculation (min) 39 min    Activity Tolerance Patient tolerated treatment well    Behavior During Therapy [facility] for tasks assessed/performed              Past Medical History:  Diagnosis Date   Anxiety    Arthritis of both knees 03/22/2017   Asthma    as a child, no issues in adulthood   Depression    Fatty liver    GERD (gastroesophageal reflux disease)    Hypertension    Multiple thyroid  nodules    Osteoporosis 03/22/2017   Pancytopenia (HCC) 03/22/2017   Past Surgical History:  Procedure Laterality Date   ANTERIOR CERVICAL DECOMP/DISCECTOMY FUSION N/A 02/21/2023   Procedure: Cervical Three-Cervical Four, Cervical Four - Cervical Five, Cervical Five-Cervical Six  Anterior Cervical Discectomy Fusion;  Surgeon: Colon Shove, MD;  Location: [facility] OR;  Service: Neurosurgery;  Laterality: N/A;  RM 21 3C   KNEE SURGERY Left    Patient Active Problem List   Diagnosis Date Noted   Cervical spondylosis with myelopathy 02/21/2023   Pancytopenia (HCC) 03/22/2017   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   Arthritis of both knees 03/22/2017   Osteoporosis 03/22/2017    PCP: Cleotilde Planas, MD   REFERRING PROVIDER: Dolphus Reiter, MD   REFERRING DIAG:  M81.0 (ICD-10-CM) - Age-related osteoporosis without current pathological fracture  Z87.81 (ICD-10-CM) - History of vertebral fracture  Z91.81 (ICD-10-CM) - History of recent fall    Rationale for Evaluation and Treatment: Rehabilitation  THERAPY DIAG:  Abnormal posture  Muscle weakness  (generalized)  Other symptoms and signs involving the musculoskeletal system  Other low back pain  ONSET DATE: Chronic  SUBJECTIVE:                                                                                                                                                                                           SUBJECTIVE STATEMENT:  Things are feeling OK, feel like PT has been helpful. I've had a lot of unexpected things come up so I had to cancel a lot but I'd really like to continue. More aware  of balance issues.     EVAL: States she is mainly here at physical therapy due to the referral. Has OA everywhere and has had several issues and thinks about anything would be beneficial. States she has been like this for awhile. States she is a chronic faller and has had many falls. Most recent fall was from tripping over branch. Does not exercise and states she does not really like going to the gym.  PERTINENT HISTORY:  Anxiety, depression, arthritis in both knees, osteoporosis, pancytopenia, hypertension, L knee surgery, cervical decompression, double jointed, vertigo  PAIN:  Are you having pain? No 0/10 not really pain but I feel very stiff for some reason, some tingling as well   PRECAUTIONS: None  WEIGHT BEARING RESTRICTIONS: No  FALLS:  Has patient fallen in last 6 months? Yes. Number of falls 1  OCCUPATION: Dog [employer]  PLOF: Independent  PATIENT GOALS: Work without limitations, decrease pain   NEXT MD VISIT:   OBJECTIVE: (objective measures from initial evaluation unless otherwise dated)  DIAGNOSTIC FINDINGS:    PATIENT SURVEYS:  LEFS  Extreme difficulty/unable (0), Quite a bit of difficulty (1), Moderate difficulty (2), Little difficulty (3), No difficulty (4) Survey date:  EVAL  Any of your usual work, housework or school activities 4  2. Usual hobbies, recreational or sporting activities 4  3. Getting into/out of the bath 4  4. Walking between rooms 4   5. Putting on socks/shoes 4  6. Squatting  1  7. Lifting an object, like a bag of groceries from the floor 3  8. Performing light activities around your home 4  9. Performing heavy activities around your home 3  10. Getting into/out of a car 3  11. Walking 2 blocks 3  12. Walking 1 mile 2  13. Going up/down 10 stairs (1 flight) 2  14. Standing for 1 hour 2  15.  sitting for 1 hour 4  16. Running on even ground 0  17. Running on uneven ground 0  18. Making sharp turns while running fast 0  19. Hopping  0  20. Rolling over in bed 4  Score total:  51/80    UEFS  Extreme difficulty/unable (0), Quite a bit of difficulty (1), Moderate difficulty (2), Little difficulty (3), No difficulty (4) Survey date:  EVAL  Any of your usual work, household or school activities 3  2. Your usual hobbies, recreational/sport activities 3   3. Lifting a bag of groceries to waist level 2   4. Lifting a bag of groceries above your head 4  5. Grooming your hair 3  6. Pushing up on your hands (I.e. from bathtub or chair) 3  7. Preparing food (I.e. peeling/cutting) 4  8. Driving  4  9. Vacuuming, sweeping, or raking 3  10. Dressing  4  11. Doing up buttons 3  12. Using tools/appliances 3  13. Opening doors 3  14. Cleaning  3  15. Tying or lacing shoes 4  16. Sleeping  4  17. Laundering clothes (I.e. washing, ironing, folding) 4  18. Opening a jar 1  19. Throwing a ball 2  20. Carrying a small suitcase with your affected limb.  2  Score total:  62/80     SCREENING FOR RED FLAGS: Bowel or bladder incontinence: No Spinal tumors: No Cauda equina syndrome: No Compression fracture: No Abdominal aneurysm: No  COGNITION: Overall cognitive status: Within functional limits for tasks assessed     SENSATION: WFL  POSTURE:  No Significant postural limitations, rounded shoulders, forward head, increased lumbar lordosis, decreased thoracic kyphosis, and anterior pelvic tilt  PALPATION:   LUMBAR  ROM:   AROM EVAL  Flexion WFL  Extension   Right lateral flexion WFL  Left lateral flexion WFL  Right rotation WFL *  Left rotation WFL   (Blank rows = not tested) * = pain/symptoms  UPPER EXTREMITY ROM:   Active ROM Right EVAL Left EVAL  Shoulder flexion [facility]  Shoulder extension    Shoulder abduction [facility]  Shoulder adduction    Shoulder internal rotation    Shoulder external rotation    Elbow flexion    Elbow extension    Wrist flexion    Wrist extension    Wrist ulnar deviation    Wrist radial deviation    Wrist pronation    Wrist supination    (Blank rows = not tested) *= pain/symptoms  UPPER EXTREMITY MMT:  MMT Right EVAL Left EVAL 08/21/24 R 08/21/24 L  Shoulder flexion 4- 4 4+ 4+  Shoulder extension      Shoulder abduction 4- 4 4+ 3  Shoulder adduction      Shoulder internal rotation 4+ 4+ 4+ 4+  Shoulder external rotation 4- 4- 4 4  Middle trapezius      Lower trapezius      Elbow flexion 5 4 4 4   Elbow extension      Wrist flexion      Wrist extension      Wrist ulnar deviation      Wrist radial deviation      Wrist pronation      Wrist supination      Grip strength (lbs)      (Blank rows = not tested) *= pain/symptoms   LOWER EXTREMITY ROM:     Active  Right eval Left eval  Hip flexion    Hip extension    Hip abduction    Hip adduction    Hip internal rotation    Hip external rotation    Knee flexion    Knee extension    Ankle dorsiflexion    Ankle plantarflexion    Ankle inversion    Ankle eversion     (Blank rows = not tested) * = pain/symptoms  LOWER EXTREMITY MMT:    MMT Right eval Left eval Right 08/21/24 Left 08/21/24  Hip flexion 4 4 4+ 4+  Hip extension 4 4-    Hip abduction 4+ 4+ 4+ 4-  Hip adduction      Hip internal rotation      Hip external rotation      Knee flexion 5 4+* 4+ 4+  Knee extension 5 5 5 5   Ankle dorsiflexion 5 5    Ankle plantarflexion      Ankle inversion      Ankle eversion        (Blank rows = not tested) * = pain/symptoms  FUNCTIONAL TESTS:  5 times sit to stand: 11.50 seconds     07/24/24 0001  Standardized Balance Assessment  Standardized Balance Assessment Dynamic Gait Index  Dynamic Gait Index  Level Surface 3  Change in Gait Speed 3  Gait with Horizontal Head Turns 2  Gait with Vertical Head Turns 2  Gait and Pivot Turn 2  Step Over Obstacle 3  Step Around Obstacles 3  Steps 2  Total Score 20      GAIT: Distance walked: 50-100 ft from lobby to treatment room Assistive device  utilized: None Level of assistance: Complete Independence Comments: Toe out gait on RLE, mild trendelenburg, lumbar lordosis   TODAY'S TREATMENT:                                                                                                                              DATE:  08/31/24 Nustep L5x6 minutes all four extremities, seat 8 STS 3 x 10 Standing hip ABD green TB 2x10 Step up 6 inch 2 x 10 Standing alternating march on airex 2 x 10 Hip hinge 2 x 10   08/21/24  Nustep L5x8 minutes all four extremities, seat 8 MMT, goals Shuttle BLE press 62# x10 Shuttle single leg press 25# x10 B  Standing hip ABD green TB 2x12 B Standing hip flexion green TB 2x12 B  Tandem blue foam pad 4x30 seconds alternating  Tandem walks forward/backward 3 laps next to high table  Alternating cone taps x20  Alternating cross midline cone taps x20     PATIENT EDUCATION:  Education details: Patient educated on exam findings, POC, scope of PT, HEP, pain science, relevant anatomy, and activity tolerance. Person educated: Patient Education method: Explanation, Demonstration, and Handouts Education comprehension: verbalized understanding, returned demonstration, verbal cues required, and tactile cues required  HOME EXERCISE PROGRAM: Access Code: [email or web address] URL: [email or web address] Date: 07/12/2024 Prepared by: Lili Finder  Exercises - Standing March with  Counter Support  - 1 x daily - 7 x weekly - 3 sets - 10 reps - Standing Hip Extension with Unilateral Counter Support  - 1 x daily - 7 x weekly - 3 sets - 10 reps - Seated Hip Abduction  - 1 x daily - 7 x weekly - 3 sets - 10 reps  ASSESSMENT:  CLINICAL IMPRESSION:  Continued to work on functional strengthening and balance training which is tolerated well. HHA for balance required throughout session with cueing to reduce as able. Patient will continue to benefit from physical therapy in order to improve function and reduce impairment.     EVAL: Patient a 79 y.o. y.o. female who was seen today for physical therapy evaluation and treatment for general weakness and widespread pain. Patient presents with pain limited deficits in lower and upper extremity strength, ROM, endurance, activity tolerance, and functional mobility with ADL. Patient is having to modify and restrict ADL as indicated by outcome measure score as well as subjective information and objective measures which is affecting overall participation. Patient will benefit from skilled physical therapy in order to improve function and reduce impairment.  OBJECTIVE IMPAIRMENTS: Abnormal gait, decreased activity tolerance, decreased balance, decreased endurance, decreased mobility, difficulty walking, decreased ROM, decreased strength, hypomobility, increased muscle spasms, impaired flexibility, improper body mechanics, postural dysfunction, and pain  ACTIVITY LIMITATIONS: carrying, lifting, bending, standing, squatting, stairs, transfers, bed mobility, reach over head, locomotion level, and caring for others  PARTICIPATION LIMITATIONS:  meal prep, cleaning, laundry, shopping, community activity, occupation, and yard work  PERSONAL FACTORS: Age, Time since onset  of injury/illness/exacerbation, and 3+ comorbidities: Anxiety, depression, arthritis in both knees, osteoporosis, pancytopenia, hypertension, vertigo also affecting patient's  functional outcome.   REHAB POTENTIAL: Good  CLINICAL DECISION MAKING: Evolving/moderate complexity  EVALUATION COMPLEXITY: Moderate   GOALS: Goals reviewed with patient? Yes  SHORT TERM GOALS: Target date: 09/18/2024    Patient will be independent with HEP in order to improve functional outcomes. Baseline:  Goal status: IN PROGRESS 08/21/24   2.  Patient will report at least 25% improvement in symptoms for improved quality of life. Baseline:  Goal status: IN PROGRESS 08/21/24 per pt, 20% better  LONG TERM GOALS: Target date: 10/16/2024    Patient will report at least 75% improvement in symptoms for improved quality of life. Baseline:  Goal status: IN PROGRESS 08/21/24  2.  Patient will improve modified oswestry score by at least 10 points in order to indicate improved tolerance to activity. Baseline:  Goal status: IN PROGRESS 08/21/24  3.  Patient will complete 5xSTS in </ 10 seconds to indicate improved fall risk. Baseline:  Goal status: IN PROGRESS 08/21/24  4.  Patient will demonstrate grade of 5/5 MMT grade in all tested musculature as evidence of improved strength to assist with stair ambulation and gait.   Baseline:  Goal status: IN PROGRESS 08/21/24 see table   PLAN:  PT FREQUENCY: 1-2x/week  PT DURATION: 8 weeks  PLANNED INTERVENTIONS: 97164- PT Re-evaluation, 97110-Therapeutic exercises, 97530- Therapeutic activity, 97112- Neuromuscular re-education, 97535- Self Care, 02859- Manual therapy, [phone]- Gait training, [phone]- Orthotic Fit/training, [id number]- Canalith repositioning, [id number]- Aquatic Therapy, [phone]- Splinting, [id number]- Wound care (first 20 sq cm), 97598- Wound care (each additional 20 sq cm)Patient/Family education, Balance training, Stair training, Taping, Dry Needling, Joint mobilization, Joint manipulation, Spinal manipulation, Spinal mobilization, Scar mobilization, and DME instructions.  PLAN FOR NEXT SESSION: plan to progress as tolerate, continue quad  and glute strengthening. Might benefit from ankle strengthening HEP to help balance    Prentice GORMAN Stains, PT, DPT 08/31/2024, 2:31 PM      "

## 2024-09-04 ENCOUNTER — Ambulatory Visit (HOSPITAL_BASED_OUTPATIENT_CLINIC_OR_DEPARTMENT_OTHER)

## 2024-09-04 ENCOUNTER — Encounter (HOSPITAL_BASED_OUTPATIENT_CLINIC_OR_DEPARTMENT_OTHER): Payer: Self-pay

## 2024-09-04 DIAGNOSIS — R293 Abnormal posture: Secondary | ICD-10-CM | POA: Diagnosis not present

## 2024-09-04 DIAGNOSIS — M5459 Other low back pain: Secondary | ICD-10-CM

## 2024-09-04 DIAGNOSIS — R29898 Other symptoms and signs involving the musculoskeletal system: Secondary | ICD-10-CM

## 2024-09-04 DIAGNOSIS — M6281 Muscle weakness (generalized): Secondary | ICD-10-CM

## 2024-09-04 NOTE — Therapy (Signed)
 " OUTPATIENT PHYSICAL THERAPY THORACOLUMBAR TREATMENT/RECERT   Patient Name: Prudy Candy MRN: [id number] DOB:1945-12-15, 79 y.o., female Today's Date: 09/04/2024  END OF SESSION:  PT End of Session - 09/04/24 1352     Visit Number 7    Number of Visits 16    Date for Recertification  10/16/24    Authorization Type [employer] MCR    Progress Note Due on Visit 10    PT Start Time 1348    PT Stop Time 1430    PT Time Calculation (min) 42 min    Activity Tolerance Patient tolerated treatment well    Behavior During Therapy [facility] for tasks assessed/performed               Past Medical History:  Diagnosis Date   Anxiety    Arthritis of both knees 03/22/2017   Asthma    as a child, no issues in adulthood   Depression    Fatty liver    GERD (gastroesophageal reflux disease)    Hypertension    Multiple thyroid  nodules    Osteoporosis 03/22/2017   Pancytopenia (HCC) 03/22/2017   Past Surgical History:  Procedure Laterality Date   ANTERIOR CERVICAL DECOMP/DISCECTOMY FUSION N/A 02/21/2023   Procedure: Cervical Three-Cervical Four, Cervical Four - Cervical Five, Cervical Five-Cervical Six  Anterior Cervical Discectomy Fusion;  Surgeon: Colon Shove, MD;  Location: [facility] OR;  Service: Neurosurgery;  Laterality: N/A;  RM 21 3C   KNEE SURGERY Left    Patient Active Problem List   Diagnosis Date Noted   Cervical spondylosis with myelopathy 02/21/2023   Pancytopenia (HCC) 03/22/2017   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   Arthritis of both knees 03/22/2017   Osteoporosis 03/22/2017    PCP: Cleotilde Planas, MD   REFERRING PROVIDER: Dolphus Reiter, MD   REFERRING DIAG:  M81.0 (ICD-10-CM) - Age-related osteoporosis without current pathological fracture  Z87.81 (ICD-10-CM) - History of vertebral fracture  Z91.81 (ICD-10-CM) - History of recent fall    Rationale for Evaluation and Treatment: Rehabilitation  THERAPY DIAG:  Abnormal posture  Muscle weakness  (generalized)  Other symptoms and signs involving the musculoskeletal system  Other low back pain  ONSET DATE: Chronic  SUBJECTIVE:                                                                                                                                                                                           SUBJECTIVE STATEMENT:  Things are feeling OK, feel like PT has been helpful. I've had a lot of unexpected things come up so I had to cancel a lot but I'd really like to continue. More  aware of balance issues.     EVAL: States she is mainly here at physical therapy due to the referral. Has OA everywhere and has had several issues and thinks about anything would be beneficial. States she has been like this for awhile. States she is a chronic faller and has had many falls. Most recent fall was from tripping over branch. Does not exercise and states she does not really like going to the gym.  PERTINENT HISTORY:  Anxiety, depression, arthritis in both knees, osteoporosis, pancytopenia, hypertension, L knee surgery, cervical decompression, double jointed, vertigo  PAIN:  Are you having pain? No 0/10 not really pain but I feel very stiff for some reason, some tingling as well   PRECAUTIONS: None  WEIGHT BEARING RESTRICTIONS: No  FALLS:  Has patient fallen in last 6 months? Yes. Number of falls 1  OCCUPATION: [employer]  PLOF: Independent  PATIENT GOALS: Work without limitations, decrease pain   NEXT MD VISIT:   OBJECTIVE: (objective measures from initial evaluation unless otherwise dated)  DIAGNOSTIC FINDINGS:    PATIENT SURVEYS:  LEFS  Extreme difficulty/unable (0), Quite a bit of difficulty (1), Moderate difficulty (2), Little difficulty (3), No difficulty (4) Survey date:  EVAL  Any of your usual work, housework or school activities 4  2. Usual hobbies, recreational or sporting activities 4  3. Getting into/out of the bath 4  4. Walking between rooms 4   5. Putting on socks/shoes 4  6. Squatting  1  7. Lifting an object, like a bag of groceries from the floor 3  8. Performing light activities around your home 4  9. Performing heavy activities around your home 3  10. Getting into/out of a car 3  11. Walking 2 blocks 3  12. Walking 1 mile 2  13. Going up/down 10 stairs (1 flight) 2  14. Standing for 1 hour 2  15.  sitting for 1 hour 4  16. Running on even ground 0  17. Running on uneven ground 0  18. Making sharp turns while running fast 0  19. Hopping  0  20. Rolling over in bed 4  Score total:  51/80    UEFS  Extreme difficulty/unable (0), Quite a bit of difficulty (1), Moderate difficulty (2), Little difficulty (3), No difficulty (4) Survey date:  EVAL  Any of your usual work, household or school activities 3  2. Your usual hobbies, recreational/sport activities 3   3. Lifting a bag of groceries to waist level 2   4. Lifting a bag of groceries above your head 4  5. Grooming your hair 3  6. Pushing up on your hands (I.e. from bathtub or chair) 3  7. Preparing food (I.e. peeling/cutting) 4  8. Driving  4  9. Vacuuming, sweeping, or raking 3  10. Dressing  4  11. Doing up buttons 3  12. Using tools/appliances 3  13. Opening doors 3  14. Cleaning  3  15. Tying or lacing shoes 4  16. Sleeping  4  17. Laundering clothes (I.e. washing, ironing, folding) 4  18. Opening a jar 1  19. Throwing a ball 2  20. Carrying a small suitcase with your affected limb.  2  Score total:  62/80     SCREENING FOR RED FLAGS: Bowel or bladder incontinence: No Spinal tumors: No Cauda equina syndrome: No Compression fracture: No Abdominal aneurysm: No  COGNITION: Overall cognitive status: Within functional limits for tasks assessed     SENSATION: [facility]  POSTURE: No Significant postural limitations, rounded shoulders, forward head, increased lumbar lordosis, decreased thoracic kyphosis, and anterior pelvic tilt  PALPATION:   LUMBAR  ROM:   AROM EVAL  Flexion WFL  Extension   Right lateral flexion WFL  Left lateral flexion WFL  Right rotation WFL *  Left rotation WFL   (Blank rows = not tested) * = pain/symptoms  UPPER EXTREMITY ROM:   Active ROM Right EVAL Left EVAL  Shoulder flexion [facility]  Shoulder extension    Shoulder abduction [facility]  Shoulder adduction    Shoulder internal rotation    Shoulder external rotation    Elbow flexion    Elbow extension    Wrist flexion    Wrist extension    Wrist ulnar deviation    Wrist radial deviation    Wrist pronation    Wrist supination    (Blank rows = not tested) *= pain/symptoms  UPPER EXTREMITY MMT:  MMT Right EVAL Left EVAL 08/21/24 R 08/21/24 L  Shoulder flexion 4- 4 4+ 4+  Shoulder extension      Shoulder abduction 4- 4 4+ 3  Shoulder adduction      Shoulder internal rotation 4+ 4+ 4+ 4+  Shoulder external rotation 4- 4- 4 4  Middle trapezius      Lower trapezius      Elbow flexion 5 4 4 4   Elbow extension      Wrist flexion      Wrist extension      Wrist ulnar deviation      Wrist radial deviation      Wrist pronation      Wrist supination      Grip strength (lbs)      (Blank rows = not tested) *= pain/symptoms   LOWER EXTREMITY ROM:     Active  Right eval Left eval  Hip flexion    Hip extension    Hip abduction    Hip adduction    Hip internal rotation    Hip external rotation    Knee flexion    Knee extension    Ankle dorsiflexion    Ankle plantarflexion    Ankle inversion    Ankle eversion     (Blank rows = not tested) * = pain/symptoms  LOWER EXTREMITY MMT:    MMT Right eval Left eval Right 08/21/24 Left 08/21/24  Hip flexion 4 4 4+ 4+  Hip extension 4 4-    Hip abduction 4+ 4+ 4+ 4-  Hip adduction      Hip internal rotation      Hip external rotation      Knee flexion 5 4+* 4+ 4+  Knee extension 5 5 5 5   Ankle dorsiflexion 5 5    Ankle plantarflexion      Ankle inversion      Ankle eversion        (Blank rows = not tested) * = pain/symptoms  FUNCTIONAL TESTS:  5 times sit to stand: 11.50 seconds     07/24/24 0001  Standardized Balance Assessment  Standardized Balance Assessment Dynamic Gait Index  Dynamic Gait Index  Level Surface 3  Change in Gait Speed 3  Gait with Horizontal Head Turns 2  Gait with Vertical Head Turns 2  Gait and Pivot Turn 2  Step Over Obstacle 3  Step Around Obstacles 3  Steps 2  Total Score 20      GAIT: Distance walked: 50-100 ft from lobby to treatment room Assistive  device utilized: None Level of assistance: Complete Independence Comments: Toe out gait on RLE, mild trendelenburg, lumbar lordosis   TODAY'S TREATMENT:                                                                                                                              DATE:   09/04/24 Nustep L5x6 minutes all four extremities, seat 8 STS 3 x 10 Standing hip ABD and ext RTB at ankles 3x10ea Step up 6 inch 3x 10 Standing marches without UE support 2x10  Partial lunges 2x10ea Romberg on airex with head turns/nods x30sec ea Walking marches in hall (1/2 hall)     08/31/24 Nustep L5x6 minutes all four extremities, seat 8 STS 3 x 10 Standing hip ABD green TB 2x10 Step up 6 inch 2 x 10 Standing alternating march on airex 2 x 10 Hip hinge 2 x 10   08/21/24  Nustep L5x8 minutes all four extremities, seat 8 MMT, goals Shuttle BLE press 62# x10 Shuttle single leg press 25# x10 B  Standing hip ABD green TB 2x12 B Standing hip flexion green TB 2x12 B  Tandem blue foam pad 4x30 seconds alternating  Tandem walks forward/backward 3 laps next to high table  Alternating cone taps x20  Alternating cross midline cone taps x20     PATIENT EDUCATION:  Education details: Patient educated on exam findings, POC, scope of PT, HEP, pain science, relevant anatomy, and activity tolerance. Person educated: Patient Education method: Explanation, Demonstration, and  Handouts Education comprehension: verbalized understanding, returned demonstration, verbal cues required, and tactile cues required  HOME EXERCISE PROGRAM: Access Code: [email or web address] URL: [email or web address] Date: 07/12/2024 Prepared by: Lili Finder  Exercises - Standing March with Counter Support  - 1 x daily - 7 x weekly - 3 sets - 10 reps - Standing Hip Extension with Unilateral Counter Support  - 1 x daily - 7 x weekly - 3 sets - 10 reps - Seated Hip Abduction  - 1 x daily - 7 x weekly - 3 sets - 10 reps  ASSESSMENT:  CLINICAL IMPRESSION:  With step ups, pt demonstrates a a lateral whip motion with RLE when stepping up with L LE. Provided cues with lunges for correction of R genu valgum. Balance deficits remain evident with standing marches and other balance challenges worked on in clinic. Pt especially challenged by walking marches in terms of coordination and [facility]. Will continue to progress strength and stability as tolerated.      EVAL: Patient a 79 y.o. y.o. female who was seen today for physical therapy evaluation and treatment for general weakness and widespread pain. Patient presents with pain limited deficits in lower and upper extremity strength, ROM, endurance, activity tolerance, and functional mobility with ADL. Patient is having to modify and restrict ADL as indicated by outcome measure score as well as subjective information and objective measures which is affecting overall participation. Patient will benefit from skilled physical therapy in order  to improve function and reduce impairment.  OBJECTIVE IMPAIRMENTS: Abnormal gait, decreased activity tolerance, decreased balance, decreased endurance, decreased mobility, difficulty walking, decreased ROM, decreased strength, hypomobility, increased muscle spasms, impaired flexibility, improper body mechanics, postural dysfunction, and pain  ACTIVITY LIMITATIONS: carrying, lifting, bending, standing, squatting,  stairs, transfers, bed mobility, reach over head, locomotion level, and caring for others  PARTICIPATION LIMITATIONS:  meal prep, cleaning, laundry, shopping, community activity, occupation, and yard work  PERSONAL FACTORS: Age, Time since onset of injury/illness/exacerbation, and 3+ comorbidities: Anxiety, depression, arthritis in both knees, osteoporosis, pancytopenia, hypertension, vertigo also affecting patient's functional outcome.   REHAB POTENTIAL: Good  CLINICAL DECISION MAKING: Evolving/moderate complexity  EVALUATION COMPLEXITY: Moderate   GOALS: Goals reviewed with patient? Yes  SHORT TERM GOALS: Target date: 09/18/2024    Patient will be independent with HEP in order to improve functional outcomes. Baseline:  Goal status: IN PROGRESS 08/21/24   2.  Patient will report at least 25% improvement in symptoms for improved quality of life. Baseline:  Goal status: IN PROGRESS 08/21/24 per pt, 20% better  LONG TERM GOALS: Target date: 10/16/2024    Patient will report at least 75% improvement in symptoms for improved quality of life. Baseline:  Goal status: IN PROGRESS 08/21/24  2.  Patient will improve modified oswestry score by at least 10 points in order to indicate improved tolerance to activity. Baseline:  Goal status: IN PROGRESS 08/21/24  3.  Patient will complete 5xSTS in </ 10 seconds to indicate improved fall risk. Baseline:  Goal status: IN PROGRESS 08/21/24  4.  Patient will demonstrate grade of 5/5 MMT grade in all tested musculature as evidence of improved strength to assist with stair ambulation and gait.   Baseline:  Goal status: IN PROGRESS 08/21/24 see table   PLAN:  PT FREQUENCY: 1-2x/week  PT DURATION: 8 weeks  PLANNED INTERVENTIONS: 97164- PT Re-evaluation, 97110-Therapeutic exercises, 97530- Therapeutic activity, 97112- Neuromuscular re-education, 97535- Self Care, [id number]- Manual therapy, [phone]- Gait training, [phone]- Orthotic Fit/training,  [phone]- Canalith repositioning, [id number]- Aquatic Therapy, [phone]- Splinting, [phone]- Wound care (first 20 sq cm), 97598- Wound care (each additional 20 sq cm)Patient/Family education, Balance training, Stair training, Taping, Dry Needling, Joint mobilization, Joint manipulation, Spinal manipulation, Spinal mobilization, Scar mobilization, and DME instructions.  PLAN FOR NEXT SESSION: plan to progress as tolerate, continue quad and glute strengthening. Might benefit from ankle strengthening HEP to help balance    Asberry FORBES Rodes, PTA 09/04/2024, 3:35 PM      "

## 2024-09-07 ENCOUNTER — Encounter (HOSPITAL_BASED_OUTPATIENT_CLINIC_OR_DEPARTMENT_OTHER): Payer: Self-pay | Admitting: Physical Therapy

## 2024-09-07 ENCOUNTER — Ambulatory Visit (HOSPITAL_COMMUNITY)
Admission: RE | Admit: 2024-09-07 | Discharge: 2024-09-07 | Disposition: A | Source: Ambulatory Visit | Attending: Rheumatology

## 2024-09-07 ENCOUNTER — Ambulatory Visit (HOSPITAL_BASED_OUTPATIENT_CLINIC_OR_DEPARTMENT_OTHER): Admitting: Physical Therapy

## 2024-09-07 VITALS — BP 121/76 | HR 89 | Temp 97.3°F | Resp 16

## 2024-09-07 DIAGNOSIS — R29898 Other symptoms and signs involving the musculoskeletal system: Secondary | ICD-10-CM

## 2024-09-07 DIAGNOSIS — R293 Abnormal posture: Secondary | ICD-10-CM | POA: Diagnosis not present

## 2024-09-07 DIAGNOSIS — M6281 Muscle weakness (generalized): Secondary | ICD-10-CM

## 2024-09-07 DIAGNOSIS — M810 Age-related osteoporosis without current pathological fracture: Secondary | ICD-10-CM | POA: Diagnosis present

## 2024-09-07 DIAGNOSIS — M5459 Other low back pain: Secondary | ICD-10-CM

## 2024-09-07 MED ORDER — ROMOSOZUMAB-AQQG 105 MG/1.17ML ~~LOC~~ SOSY
PREFILLED_SYRINGE | SUBCUTANEOUS | Status: AC
  Filled 2024-09-07: qty 1.17

## 2024-09-07 MED ORDER — ROMOSOZUMAB-AQQG 105 MG/1.17ML ~~LOC~~ SOSY
210.0000 mg | PREFILLED_SYRINGE | Freq: Once | SUBCUTANEOUS | Status: AC
  Administered 2024-09-07: 210 mg via SUBCUTANEOUS

## 2024-09-07 NOTE — Therapy (Signed)
 " OUTPATIENT PHYSICAL THERAPY THORACOLUMBAR TREATMENT   Patient Name: Lamari Youngers MRN: [id number] DOB:10-Dec-1945, 79 y.o., female Today's Date: 09/07/2024  END OF SESSION:  PT End of Session - 09/07/24 1435     Visit Number 8    Number of Visits 16    Date for Recertification  10/16/24    Authorization Type [employer] MCR    Progress Note Due on Visit 10    PT Start Time 1434    PT Stop Time 1512    PT Time Calculation (min) 38 min    Activity Tolerance Patient tolerated treatment well    Behavior During Therapy [facility] for tasks assessed/performed               Past Medical History:  Diagnosis Date   Anxiety    Arthritis of both knees 03/22/2017   Asthma    as a child, no issues in adulthood   Depression    Fatty liver    GERD (gastroesophageal reflux disease)    Hypertension    Multiple thyroid  nodules    Osteoporosis 03/22/2017   Pancytopenia (HCC) 03/22/2017   Past Surgical History:  Procedure Laterality Date   ANTERIOR CERVICAL DECOMP/DISCECTOMY FUSION N/A 02/21/2023   Procedure: Cervical Three-Cervical Four, Cervical Four - Cervical Five, Cervical Five-Cervical Six  Anterior Cervical Discectomy Fusion;  Surgeon: Colon Shove, MD;  Location: [facility] OR;  Service: Neurosurgery;  Laterality: N/A;  RM 21 3C   KNEE SURGERY Left    Patient Active Problem List   Diagnosis Date Noted   Cervical spondylosis with myelopathy 02/21/2023   Pancytopenia (HCC) 03/22/2017   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   Arthritis of both knees 03/22/2017   Osteoporosis 03/22/2017    PCP: Cleotilde Planas, MD   REFERRING PROVIDER: Dolphus Reiter, MD   REFERRING DIAG:  M81.0 (ICD-10-CM) - Age-related osteoporosis without current pathological fracture  Z87.81 (ICD-10-CM) - History of vertebral fracture  Z91.81 (ICD-10-CM) - History of recent fall    Rationale for Evaluation and Treatment: Rehabilitation  THERAPY DIAG:  Abnormal posture  Muscle weakness  (generalized)  Other symptoms and signs involving the musculoskeletal system  Other low back pain  ONSET DATE: Chronic  SUBJECTIVE:                                                                                                                                                                                           SUBJECTIVE STATEMENT:  Was sore after last session but feels like things have been helpful.    EVAL: States she is mainly here at physical therapy due to the referral. Has OA everywhere and has  had several issues and thinks about anything would be beneficial. States she has been like this for awhile. States she is a chronic faller and has had many falls. Most recent fall was from tripping over branch. Does not exercise and states she does not really like going to the gym.  PERTINENT HISTORY:  Anxiety, depression, arthritis in both knees, osteoporosis, pancytopenia, hypertension, L knee surgery, cervical decompression, double jointed, vertigo  PAIN:  Are you having pain? No 0/10 not really pain but I feel very stiff for some reason, some tingling as well   PRECAUTIONS: None  WEIGHT BEARING RESTRICTIONS: No  FALLS:  Has patient fallen in last 6 months? Yes. Number of falls 1  OCCUPATION: Dog [employer]  PLOF: Independent  PATIENT GOALS: Work without limitations, decrease pain   NEXT MD VISIT:   OBJECTIVE: (objective measures from initial evaluation unless otherwise dated)  DIAGNOSTIC FINDINGS:    PATIENT SURVEYS:  LEFS  Extreme difficulty/unable (0), Quite a bit of difficulty (1), Moderate difficulty (2), Little difficulty (3), No difficulty (4) Survey date:  EVAL  Any of your usual work, housework or school activities 4  2. Usual hobbies, recreational or sporting activities 4  3. Getting into/out of the bath 4  4. Walking between rooms 4  5. Putting on socks/shoes 4  6. Squatting  1  7. Lifting an object, like a bag of groceries from the floor 3  8.  Performing light activities around your home 4  9. Performing heavy activities around your home 3  10. Getting into/out of a car 3  11. Walking 2 blocks 3  12. Walking 1 mile 2  13. Going up/down 10 stairs (1 flight) 2  14. Standing for 1 hour 2  15.  sitting for 1 hour 4  16. Running on even ground 0  17. Running on uneven ground 0  18. Making sharp turns while running fast 0  19. Hopping  0  20. Rolling over in bed 4  Score total:  51/80    UEFS  Extreme difficulty/unable (0), Quite a bit of difficulty (1), Moderate difficulty (2), Little difficulty (3), No difficulty (4) Survey date:  EVAL  Any of your usual work, household or school activities 3  2. Your usual hobbies, recreational/sport activities 3   3. Lifting a bag of groceries to waist level 2   4. Lifting a bag of groceries above your head 4  5. Grooming your hair 3  6. Pushing up on your hands (I.e. from bathtub or chair) 3  7. Preparing food (I.e. peeling/cutting) 4  8. Driving  4  9. Vacuuming, sweeping, or raking 3  10. Dressing  4  11. Doing up buttons 3  12. Using tools/appliances 3  13. Opening doors 3  14. Cleaning  3  15. Tying or lacing shoes 4  16. Sleeping  4  17. Laundering clothes (I.e. washing, ironing, folding) 4  18. Opening a jar 1  19. Throwing a ball 2  20. Carrying a small suitcase with your affected limb.  2  Score total:  62/80     SCREENING FOR RED FLAGS: Bowel or bladder incontinence: No Spinal tumors: No Cauda equina syndrome: No Compression fracture: No Abdominal aneurysm: No  COGNITION: Overall cognitive status: Within functional limits for tasks assessed     SENSATION: WFL  POSTURE: No Significant postural limitations, rounded shoulders, forward head, increased lumbar lordosis, decreased thoracic kyphosis, and anterior pelvic tilt  PALPATION:   LUMBAR ROM:  AROM EVAL  Flexion WFL  Extension   Right lateral flexion WFL  Left lateral flexion WFL  Right rotation  WFL *  Left rotation WFL   (Blank rows = not tested) * = pain/symptoms  UPPER EXTREMITY ROM:   Active ROM Right EVAL Left EVAL  Shoulder flexion [facility]  Shoulder extension    Shoulder abduction [facility]  Shoulder adduction    Shoulder internal rotation    Shoulder external rotation    Elbow flexion    Elbow extension    Wrist flexion    Wrist extension    Wrist ulnar deviation    Wrist radial deviation    Wrist pronation    Wrist supination    (Blank rows = not tested) *= pain/symptoms  UPPER EXTREMITY MMT:  MMT Right EVAL Left EVAL 08/21/24 R 08/21/24 L  Shoulder flexion 4- 4 4+ 4+  Shoulder extension      Shoulder abduction 4- 4 4+ 3  Shoulder adduction      Shoulder internal rotation 4+ 4+ 4+ 4+  Shoulder external rotation 4- 4- 4 4  Middle trapezius      Lower trapezius      Elbow flexion 5 4 4 4   Elbow extension      Wrist flexion      Wrist extension      Wrist ulnar deviation      Wrist radial deviation      Wrist pronation      Wrist supination      Grip strength (lbs)      (Blank rows = not tested) *= pain/symptoms   LOWER EXTREMITY ROM:     Active  Right eval Left eval  Hip flexion    Hip extension    Hip abduction    Hip adduction    Hip internal rotation    Hip external rotation    Knee flexion    Knee extension    Ankle dorsiflexion    Ankle plantarflexion    Ankle inversion    Ankle eversion     (Blank rows = not tested) * = pain/symptoms  LOWER EXTREMITY MMT:    MMT Right eval Left eval Right 08/21/24 Left 08/21/24  Hip flexion 4 4 4+ 4+  Hip extension 4 4-    Hip abduction 4+ 4+ 4+ 4-  Hip adduction      Hip internal rotation      Hip external rotation      Knee flexion 5 4+* 4+ 4+  Knee extension 5 5 5 5   Ankle dorsiflexion 5 5    Ankle plantarflexion      Ankle inversion      Ankle eversion       (Blank rows = not tested) * = pain/symptoms  FUNCTIONAL TESTS:  5 times sit to stand: 11.50 seconds     07/24/24  0001  Standardized Balance Assessment  Standardized Balance Assessment Dynamic Gait Index  Dynamic Gait Index  Level Surface 3  Change in Gait Speed 3  Gait with Horizontal Head Turns 2  Gait with Vertical Head Turns 2  Gait and Pivot Turn 2  Step Over Obstacle 3  Step Around Obstacles 3  Steps 2  Total Score 20      GAIT: Distance walked: 50-100 ft from lobby to treatment room Assistive device utilized: None Level of assistance: Complete Independence Comments: Toe out gait on RLE, mild trendelenburg, lumbar lordosis   TODAY'S TREATMENT:  DATE:  09/07/24 Seated elliptical level 3 5 minutes for dynamic warm up Standing row GTB 2 x 10 Standing shoulder extension GTB 2 x 10 Standing overhead press 3# 2 x 10 Lunges with UE support 2 x 10 Lateral step up 6 inch 2 x 10 STS 2 x 10   09/04/24 Nustep L5x6 minutes all four extremities, seat 8 STS 3 x 10 Standing hip ABD and ext RTB at ankles 3x10ea Step up 6 inch 3x 10 Standing marches without UE support 2x10  Partial lunges 2x10ea Romberg on airex with head turns/nods x30sec ea Walking marches in hall (1/2 hall)     08/31/24 Nustep L5x6 minutes all four extremities, seat 8 STS 3 x 10 Standing hip ABD green TB 2x10 Step up 6 inch 2 x 10 Standing alternating march on airex 2 x 10 Hip hinge 2 x 10   08/21/24  Nustep L5x8 minutes all four extremities, seat 8 MMT, goals Shuttle BLE press 62# x10 Shuttle single leg press 25# x10 B  Standing hip ABD green TB 2x12 B Standing hip flexion green TB 2x12 B  Tandem blue foam pad 4x30 seconds alternating  Tandem walks forward/backward 3 laps next to high table  Alternating cone taps x20  Alternating cross midline cone taps x20     PATIENT EDUCATION:  Education details: Patient educated on exam findings, POC, scope of PT, HEP, pain science, relevant  anatomy, and activity tolerance. Person educated: Patient Education method: Explanation, Demonstration, and Handouts Education comprehension: verbalized understanding, returned demonstration, verbal cues required, and tactile cues required  HOME EXERCISE PROGRAM: Access Code: [email or web address] URL: [email or web address] Date: 07/12/2024 Prepared by: Lili Finder  Exercises - Standing March with Counter Support  - 1 x daily - 7 x weekly - 3 sets - 10 reps - Standing Hip Extension with Unilateral Counter Support  - 1 x daily - 7 x weekly - 3 sets - 10 reps - Seated Hip Abduction  - 1 x daily - 7 x weekly - 3 sets - 10 reps  ASSESSMENT:  CLINICAL IMPRESSION: Began session on seated elliptical for dynamic warm up and conditioning. Performed resisted postural/shoulder strengthening today with cueing for core activation with good carry over. Continued with LE strengthening. Patient got hot with standing functional strengthening, improves with seated rest break.  Patient will continue to benefit from physical therapy in order to improve function and reduce impairment.      EVAL: Patient a 79 y.o. y.o. female who was seen today for physical therapy evaluation and treatment for general weakness and widespread pain. Patient presents with pain limited deficits in lower and upper extremity strength, ROM, endurance, activity tolerance, and functional mobility with ADL. Patient is having to modify and restrict ADL as indicated by outcome measure score as well as subjective information and objective measures which is affecting overall participation. Patient will benefit from skilled physical therapy in order to improve function and reduce impairment.  OBJECTIVE IMPAIRMENTS: Abnormal gait, decreased activity tolerance, decreased balance, decreased endurance, decreased mobility, difficulty walking, decreased ROM, decreased strength, hypomobility, increased muscle spasms, impaired flexibility,  improper body mechanics, postural dysfunction, and pain  ACTIVITY LIMITATIONS: carrying, lifting, bending, standing, squatting, stairs, transfers, bed mobility, reach over head, locomotion level, and caring for others  PARTICIPATION LIMITATIONS:  meal prep, cleaning, laundry, shopping, community activity, occupation, and yard work  PERSONAL FACTORS: Age, Time since onset of injury/illness/exacerbation, and 3+ comorbidities: Anxiety, depression, arthritis in both knees, osteoporosis, pancytopenia, hypertension, vertigo also affecting patient's functional outcome.  REHAB POTENTIAL: Good  CLINICAL DECISION MAKING: Evolving/moderate complexity  EVALUATION COMPLEXITY: Moderate   GOALS: Goals reviewed with patient? Yes  SHORT TERM GOALS: Target date: 09/18/2024    Patient will be independent with HEP in order to improve functional outcomes. Baseline:  Goal status: IN PROGRESS 08/21/24   2.  Patient will report at least 25% improvement in symptoms for improved quality of life. Baseline:  Goal status: IN PROGRESS 08/21/24 per pt, 20% better  LONG TERM GOALS: Target date: 10/16/2024    Patient will report at least 75% improvement in symptoms for improved quality of life. Baseline:  Goal status: IN PROGRESS 08/21/24  2.  Patient will improve modified oswestry score by at least 10 points in order to indicate improved tolerance to activity. Baseline:  Goal status: IN PROGRESS 08/21/24  3.  Patient will complete 5xSTS in </ 10 seconds to indicate improved fall risk. Baseline:  Goal status: IN PROGRESS 08/21/24  4.  Patient will demonstrate grade of 5/5 MMT grade in all tested musculature as evidence of improved strength to assist with stair ambulation and gait.   Baseline:  Goal status: IN PROGRESS 08/21/24 see table   PLAN:  PT FREQUENCY: 1-2x/week  PT DURATION: 8 weeks  PLANNED INTERVENTIONS: 97164- PT Re-evaluation, 97110-Therapeutic exercises, 97530- Therapeutic activity,  97112- Neuromuscular re-education, 97535- Self Care, [id number]- Manual therapy, [phone]- Gait training, [phone]- Orthotic Fit/training, [phone]- Canalith repositioning, [id number]- Aquatic Therapy, [phone]- Splinting, [phone]- Wound care (first 20 sq cm), 97598- Wound care (each additional 20 sq cm)Patient/Family education, Balance training, Stair training, Taping, Dry Needling, Joint mobilization, Joint manipulation, Spinal manipulation, Spinal mobilization, Scar mobilization, and DME instructions.  PLAN FOR NEXT SESSION: plan to progress as tolerate, continue quad and glute strengthening. Might benefit from ankle strengthening HEP to help balance    Prentice GORMAN Stains, PT 09/07/2024, 3:13 PM      "

## 2024-09-14 ENCOUNTER — Encounter (HOSPITAL_COMMUNITY)

## 2024-10-01 ENCOUNTER — Ambulatory Visit (HOSPITAL_BASED_OUTPATIENT_CLINIC_OR_DEPARTMENT_OTHER): Admitting: Physical Therapy

## 2024-10-04 ENCOUNTER — Encounter (HOSPITAL_BASED_OUTPATIENT_CLINIC_OR_DEPARTMENT_OTHER): Payer: Self-pay | Admitting: Physical Therapy

## 2024-10-04 ENCOUNTER — Ambulatory Visit (HOSPITAL_BASED_OUTPATIENT_CLINIC_OR_DEPARTMENT_OTHER): Admitting: Physical Therapy

## 2024-10-04 DIAGNOSIS — R293 Abnormal posture: Secondary | ICD-10-CM

## 2024-10-04 DIAGNOSIS — R252 Cramp and spasm: Secondary | ICD-10-CM

## 2024-10-04 DIAGNOSIS — M5459 Other low back pain: Secondary | ICD-10-CM

## 2024-10-04 DIAGNOSIS — R29898 Other symptoms and signs involving the musculoskeletal system: Secondary | ICD-10-CM

## 2024-10-04 DIAGNOSIS — M6281 Muscle weakness (generalized): Secondary | ICD-10-CM

## 2024-10-04 NOTE — Therapy (Signed)
 " OUTPATIENT PHYSICAL THERAPY THORACOLUMBAR TREATMENT   Patient Name: Sandra Day MRN: [id number] DOB:12-31-1945, 79 y.o., female Today's Date: 10/04/2024  END OF SESSION:  PT End of Session - 10/04/24 1443     Visit Number 9    Number of Visits 16    Date for Recertification  10/16/24    Authorization Type [employer] MCR    Progress Note Due on Visit 10    PT Start Time 1433    PT Stop Time 1511    PT Time Calculation (min) 38 min    Activity Tolerance Patient tolerated treatment well    Behavior During Therapy [facility] for tasks assessed/performed                Past Medical History:  Diagnosis Date   Anxiety    Arthritis of both knees 03/22/2017   Asthma    as a child, no issues in adulthood   Depression    Fatty liver    GERD (gastroesophageal reflux disease)    Hypertension    Multiple thyroid  nodules    Osteoporosis 03/22/2017   Pancytopenia (HCC) 03/22/2017   Past Surgical History:  Procedure Laterality Date   ANTERIOR CERVICAL DECOMP/DISCECTOMY FUSION N/A 02/21/2023   Procedure: Cervical Three-Cervical Four, Cervical Four - Cervical Five, Cervical Five-Cervical Six  Anterior Cervical Discectomy Fusion;  Surgeon: Colon Shove, MD;  Location: [facility] OR;  Service: Neurosurgery;  Laterality: N/A;  RM 21 3C   KNEE SURGERY Left    Patient Active Problem List   Diagnosis Date Noted   Cervical spondylosis with myelopathy 02/21/2023   Pancytopenia (HCC) 03/22/2017   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   Arthritis of both knees 03/22/2017   Osteoporosis 03/22/2017    PCP: Cleotilde Planas, MD   REFERRING PROVIDER: Dolphus Reiter, MD   REFERRING DIAG:  M81.0 (ICD-10-CM) - Age-related osteoporosis without current pathological fracture  Z87.81 (ICD-10-CM) - History of vertebral fracture  Z91.81 (ICD-10-CM) - History of recent fall    Rationale for Evaluation and Treatment: Rehabilitation  THERAPY DIAG:  Abnormal posture  Muscle weakness  (generalized)  Other symptoms and signs involving the musculoskeletal system  Other low back pain  Cramp and spasm  ONSET DATE: Chronic  SUBJECTIVE:                                                                                                                                                                                           SUBJECTIVE STATEMENT:  Its been a minute since I've been here, haven't gotten out of the house much with the weather. Have been shoveling snow a lot and back is killing  me today. Have been doing HEP more often being stuck in the house.     EVAL: States she is mainly here at physical therapy due to the referral. Has OA everywhere and has had several issues and thinks about anything would be beneficial. States she has been like this for awhile. States she is a chronic faller and has had many falls. Most recent fall was from tripping over branch. Does not exercise and states she does not really like going to the gym.  PERTINENT HISTORY:  Anxiety, depression, arthritis in both knees, osteoporosis, pancytopenia, hypertension, L knee surgery, cervical decompression, double jointed, vertigo  PAIN:  Are you having pain? Yes: NPRS scale: 4/10 Pain location: back and neck Pain description: ache and some tingling   Aggravating factors: nothing  Relieving factors: nothing     PRECAUTIONS: None  WEIGHT BEARING RESTRICTIONS: No  FALLS:  Has patient fallen in last 6 months? Yes. Number of falls 1  OCCUPATION: Dog [employer]  PLOF: Independent  PATIENT GOALS: Work without limitations, decrease pain   NEXT MD VISIT:   OBJECTIVE: (objective measures from initial evaluation unless otherwise dated)  DIAGNOSTIC FINDINGS:    PATIENT SURVEYS:  LEFS  Extreme difficulty/unable (0), Quite a bit of difficulty (1), Moderate difficulty (2), Little difficulty (3), No difficulty (4) Survey date:  EVAL  Any of your usual work, housework or school activities 4  2. Usual  hobbies, recreational or sporting activities 4  3. Getting into/out of the bath 4  4. Walking between rooms 4  5. Putting on socks/shoes 4  6. Squatting  1  7. Lifting an object, like a bag of groceries from the floor 3  8. Performing light activities around your home 4  9. Performing heavy activities around your home 3  10. Getting into/out of a car 3  11. Walking 2 blocks 3  12. Walking 1 mile 2  13. Going up/down 10 stairs (1 flight) 2  14. Standing for 1 hour 2  15.  sitting for 1 hour 4  16. Running on even ground 0  17. Running on uneven ground 0  18. Making sharp turns while running fast 0  19. Hopping  0  20. Rolling over in bed 4  Score total:  51/80    UEFS  Extreme difficulty/unable (0), Quite a bit of difficulty (1), Moderate difficulty (2), Little difficulty (3), No difficulty (4) Survey date:  EVAL  Any of your usual work, household or school activities 3  2. Your usual hobbies, recreational/sport activities 3   3. Lifting a bag of groceries to waist level 2   4. Lifting a bag of groceries above your head 4  5. Grooming your hair 3  6. Pushing up on your hands (I.e. from bathtub or chair) 3  7. Preparing food (I.e. peeling/cutting) 4  8. Driving  4  9. Vacuuming, sweeping, or raking 3  10. Dressing  4  11. Doing up buttons 3  12. Using tools/appliances 3  13. Opening doors 3  14. Cleaning  3  15. Tying or lacing shoes 4  16. Sleeping  4  17. Laundering clothes (I.e. washing, ironing, folding) 4  18. Opening a jar 1  19. Throwing a ball 2  20. Carrying a small suitcase with your affected limb.  2  Score total:  62/80     SCREENING FOR RED FLAGS: Bowel or bladder incontinence: No Spinal tumors: No Cauda equina syndrome: No Compression fracture: No Abdominal aneurysm: No  COGNITION: Overall cognitive status: Within functional limits for tasks assessed     SENSATION: WFL  POSTURE: No Significant postural limitations, rounded shoulders, forward  head, increased lumbar lordosis, decreased thoracic kyphosis, and anterior pelvic tilt  PALPATION:   LUMBAR ROM:   AROM EVAL  Flexion WFL  Extension   Right lateral flexion WFL  Left lateral flexion WFL  Right rotation WFL *  Left rotation WFL   (Blank rows = not tested) * = pain/symptoms  UPPER EXTREMITY ROM:   Active ROM Right EVAL Left EVAL  Shoulder flexion [facility]  Shoulder extension    Shoulder abduction [facility]  Shoulder adduction    Shoulder internal rotation    Shoulder external rotation    Elbow flexion    Elbow extension    Wrist flexion    Wrist extension    Wrist ulnar deviation    Wrist radial deviation    Wrist pronation    Wrist supination    (Blank rows = not tested) *= pain/symptoms  UPPER EXTREMITY MMT:  MMT Right EVAL Left EVAL 08/21/24 R 08/21/24 L  Shoulder flexion 4- 4 4+ 4+  Shoulder extension      Shoulder abduction 4- 4 4+ 3  Shoulder adduction      Shoulder internal rotation 4+ 4+ 4+ 4+  Shoulder external rotation 4- 4- 4 4  Middle trapezius      Lower trapezius      Elbow flexion 5 4 4 4   Elbow extension      Wrist flexion      Wrist extension      Wrist ulnar deviation      Wrist radial deviation      Wrist pronation      Wrist supination      Grip strength (lbs)      (Blank rows = not tested) *= pain/symptoms   LOWER EXTREMITY ROM:     Active  Right eval Left eval  Hip flexion    Hip extension    Hip abduction    Hip adduction    Hip internal rotation    Hip external rotation    Knee flexion    Knee extension    Ankle dorsiflexion    Ankle plantarflexion    Ankle inversion    Ankle eversion     (Blank rows = not tested) * = pain/symptoms  LOWER EXTREMITY MMT:    MMT Right eval Left eval Right 08/21/24 Left 08/21/24 R 10/04/24 L 10/04/24  Hip flexion 4 4 4+ 4+ 4 4  Hip extension 4 4-      Hip abduction 4+ 4+ 4+ 4- 4 4-  Hip adduction        Hip internal rotation        Hip external rotation         Knee flexion 5 4+* 4+ 4+ 4 4  Knee extension 5 5 5 5  4+ 4+  Ankle dorsiflexion 5 5      Ankle plantarflexion        Ankle inversion        Ankle eversion         (Blank rows = not tested) * = pain/symptoms  FUNCTIONAL TESTS:  5 times sit to stand: 11.50 seconds     07/24/24 0001  Standardized Balance Assessment  Standardized Balance Assessment Dynamic Gait Index  Dynamic Gait Index  Level Surface 3  Change in Gait Speed 3  Gait with Horizontal Head Turns 2  Gait with Vertical Head Turns 2  Gait and Pivot Turn 2  Step Over Obstacle 3  Step Around Obstacles 3  Steps 2  Total Score 20      GAIT: Distance walked: 50-100 ft from lobby to treatment room Assistive device utilized: None Level of assistance: Complete Independence Comments: Toe out gait on RLE, mild trendelenburg, lumbar lordosis   TODAY'S TREATMENT:                                                                                                                              DATE:   10/04/24  Scifit bike L4x8 min (Nustep not available) MMT, goals  Lumbar rotation stretch 10x3 seconds alternating  SKTC 5x3 seconds B towel behind knee Modified quadruped cat cow standing x10  Modified quadruped thoracic rotation stretch standing x10   Tandem stance blue foam pad 6x30 seconds alternating Tandem stance forward/backward at rail x2 laps    09/07/24 Seated elliptical level 3 5 minutes for dynamic warm up Standing row GTB 2 x 10 Standing shoulder extension GTB 2 x 10 Standing overhead press 3# 2 x 10 Lunges with UE support 2 x 10 Lateral step up 6 inch 2 x 10 STS 2 x 10   09/04/24 Nustep L5x6 minutes all four extremities, seat 8 STS 3 x 10 Standing hip ABD and ext RTB at ankles 3x10ea Step up 6 inch 3x 10 Standing marches without UE support 2x10  Partial lunges 2x10ea Romberg on airex with head turns/nods x30sec ea Walking marches in hall (1/2 hall)     08/31/24 Nustep L5x6 minutes all four  extremities, seat 8 STS 3 x 10 Standing hip ABD green TB 2x10 Step up 6 inch 2 x 10 Standing alternating march on airex 2 x 10 Hip hinge 2 x 10   08/21/24  Nustep L5x8 minutes all four extremities, seat 8 MMT, goals Shuttle BLE press 62# x10 Shuttle single leg press 25# x10 B  Standing hip ABD green TB 2x12 B Standing hip flexion green TB 2x12 B  Tandem blue foam pad 4x30 seconds alternating  Tandem walks forward/backward 3 laps next to high table  Alternating cone taps x20  Alternating cross midline cone taps x20     PATIENT EDUCATION:  Education details: Patient educated on exam findings, POC, scope of PT, HEP, pain science, relevant anatomy, and activity tolerance. Person educated: Patient Education method: Explanation, Demonstration, and Handouts Education comprehension: verbalized understanding, returned demonstration, verbal cues required, and tactile cues required  HOME EXERCISE PROGRAM: Access Code: [email or web address] URL: [email or web address] Date: 07/12/2024 Prepared by: Lili Finder  Exercises - Standing March with Counter Support  - 1 x daily - 7 x weekly - 3 sets - 10 reps - Standing Hip Extension with Unilateral Counter Support  - 1 x daily - 7 x weekly - 3 sets - 10 reps - Seated Hip Abduction  - 1 x daily - 7 x weekly - 3 sets -  10 reps  ASSESSMENT:  CLINICAL IMPRESSION:  Arrives today doing OK after being out of PT for about a month, having more back pain possibly due to having shoveled quite a bit due to recent weather. We warmed up on Scifit, then updated MMT and goals, otherwise worked on trying to address back pain and on ongoing balance training today. Will plan on PN and potentially recert next visit.      EVAL: Patient a 79 y.o. y.o. female who was seen today for physical therapy evaluation and treatment for general weakness and widespread pain. Patient presents with pain limited deficits in lower and upper extremity strength, ROM,  endurance, activity tolerance, and functional mobility with ADL. Patient is having to modify and restrict ADL as indicated by outcome measure score as well as subjective information and objective measures which is affecting overall participation. Patient will benefit from skilled physical therapy in order to improve function and reduce impairment.  OBJECTIVE IMPAIRMENTS: Abnormal gait, decreased activity tolerance, decreased balance, decreased endurance, decreased mobility, difficulty walking, decreased ROM, decreased strength, hypomobility, increased muscle spasms, impaired flexibility, improper body mechanics, postural dysfunction, and pain  ACTIVITY LIMITATIONS: carrying, lifting, bending, standing, squatting, stairs, transfers, bed mobility, reach over head, locomotion level, and caring for others  PARTICIPATION LIMITATIONS:  meal prep, cleaning, laundry, shopping, community activity, occupation, and yard work  PERSONAL FACTORS: Age, Time since onset of injury/illness/exacerbation, and 3+ comorbidities: Anxiety, depression, arthritis in both knees, osteoporosis, pancytopenia, hypertension, vertigo also affecting patient's functional outcome.   REHAB POTENTIAL: Good  CLINICAL DECISION MAKING: Evolving/moderate complexity  EVALUATION COMPLEXITY: Moderate   GOALS: Goals reviewed with patient? Yes  SHORT TERM GOALS: Target date: 09/18/2024    Patient will be independent with HEP in order to improve functional outcomes. Baseline:  Goal status: IN PROGRESS 10/04/24 ongoing but improving   2.  Patient will report at least 25% improvement in symptoms for improved quality of life. Baseline:  Goal status: IN PROGRESS 08/21/24 per pt, 20% better  LONG TERM GOALS: Target date: 10/16/2024    Patient will report at least 75% improvement in symptoms for improved quality of life. Baseline:  Goal status: IN PROGRESS 08/21/24  2.  Patient will improve modified oswestry score by at least 10 points  in order to indicate improved tolerance to activity. Baseline:  Goal status: IN PROGRESS 08/21/24  3.  Patient will complete 5xSTS in </ 10 seconds to indicate improved fall risk. Baseline:  Goal status: IN PROGRESS 08/21/24  4.  Patient will demonstrate grade of 5/5 MMT grade in all tested musculature as evidence of improved strength to assist with stair ambulation and gait.   Baseline:  Goal status: IN PROGRESS 10/04/24 see chart  PLAN:  PT FREQUENCY: 1-2x/week  PT DURATION: 8 weeks  PLANNED INTERVENTIONS: 97164- PT Re-evaluation, 97110-Therapeutic exercises, 97530- Therapeutic activity, 97112- Neuromuscular re-education, 97535- Self Care, [id number]- Manual therapy, [phone]- Gait training, [phone]- Orthotic Fit/training, [phone]- Canalith repositioning, [id number]- Aquatic Therapy, [phone]- Splinting, [phone]- Wound care (first 20 sq cm), 97598- Wound care (each additional 20 sq cm)Patient/Family education, Balance training, Stair training, Taping, Dry Needling, Joint mobilization, Joint manipulation, Spinal manipulation, Spinal mobilization, Scar mobilization, and DME instructions.  PLAN FOR NEXT SESSION: plan to progress as tolerate, continue quad and glute strengthening. Might benefit from ankle strengthening HEP to help balance. 10th visit PN/potential recert next visit   Josette Rough, PT, DPT 10/04/24 3:12 PM       "

## 2024-10-08 ENCOUNTER — Inpatient Hospital Stay (HOSPITAL_COMMUNITY): Admission: RE | Admit: 2024-10-08 | Source: Ambulatory Visit

## 2024-10-08 ENCOUNTER — Ambulatory Visit (HOSPITAL_BASED_OUTPATIENT_CLINIC_OR_DEPARTMENT_OTHER): Admitting: Physical Therapy

## 2024-10-11 ENCOUNTER — Encounter (HOSPITAL_BASED_OUTPATIENT_CLINIC_OR_DEPARTMENT_OTHER): Admitting: Physical Therapy

## 2024-10-15 ENCOUNTER — Encounter (HOSPITAL_BASED_OUTPATIENT_CLINIC_OR_DEPARTMENT_OTHER): Admitting: Physical Therapy

## 2024-10-18 ENCOUNTER — Encounter (HOSPITAL_BASED_OUTPATIENT_CLINIC_OR_DEPARTMENT_OTHER): Admitting: Physical Therapy

## 2024-10-22 ENCOUNTER — Encounter (HOSPITAL_BASED_OUTPATIENT_CLINIC_OR_DEPARTMENT_OTHER): Admitting: Physical Therapy

## 2024-10-25 ENCOUNTER — Encounter (HOSPITAL_BASED_OUTPATIENT_CLINIC_OR_DEPARTMENT_OTHER): Admitting: Physical Therapy

## 2024-10-29 ENCOUNTER — Encounter (HOSPITAL_BASED_OUTPATIENT_CLINIC_OR_DEPARTMENT_OTHER): Admitting: Physical Therapy

## 2024-11-01 ENCOUNTER — Encounter (HOSPITAL_BASED_OUTPATIENT_CLINIC_OR_DEPARTMENT_OTHER): Admitting: Physical Therapy

## 2024-11-05 ENCOUNTER — Encounter (HOSPITAL_BASED_OUTPATIENT_CLINIC_OR_DEPARTMENT_OTHER): Admitting: Physical Therapy

## 2024-11-07 ENCOUNTER — Encounter (HOSPITAL_COMMUNITY)

## 2024-11-08 ENCOUNTER — Encounter (HOSPITAL_BASED_OUTPATIENT_CLINIC_OR_DEPARTMENT_OTHER): Admitting: Physical Therapy

## 2024-11-12 ENCOUNTER — Encounter (HOSPITAL_BASED_OUTPATIENT_CLINIC_OR_DEPARTMENT_OTHER): Admitting: Physical Therapy

## 2024-11-15 ENCOUNTER — Encounter (HOSPITAL_BASED_OUTPATIENT_CLINIC_OR_DEPARTMENT_OTHER): Admitting: Physical Therapy

## 2024-11-19 ENCOUNTER — Encounter (HOSPITAL_BASED_OUTPATIENT_CLINIC_OR_DEPARTMENT_OTHER): Admitting: Physical Therapy

## 2024-11-22 ENCOUNTER — Encounter (HOSPITAL_BASED_OUTPATIENT_CLINIC_OR_DEPARTMENT_OTHER): Admitting: Physical Therapy

## 2025-01-09 ENCOUNTER — Ambulatory Visit: Admitting: Rheumatology
